# Patient Record
Sex: Male | Born: 1975
Health system: Southern US, Community
[De-identification: ages and names within clinical notes are randomized; demographics above are authoritative.]

## PROBLEM LIST (undated history)

## (undated) DIAGNOSIS — T4145XA Adverse effect of unspecified anesthetic, initial encounter: Secondary | ICD-10-CM

## (undated) DIAGNOSIS — I10 Essential (primary) hypertension: Secondary | ICD-10-CM

## (undated) DIAGNOSIS — G894 Chronic pain syndrome: Secondary | ICD-10-CM

## (undated) DIAGNOSIS — E119 Type 2 diabetes mellitus without complications: Secondary | ICD-10-CM

## (undated) DIAGNOSIS — T8859XA Other complications of anesthesia, initial encounter: Secondary | ICD-10-CM

## (undated) DIAGNOSIS — G709 Myoneural disorder, unspecified: Secondary | ICD-10-CM

## (undated) DIAGNOSIS — Z87442 Personal history of urinary calculi: Secondary | ICD-10-CM

## (undated) DIAGNOSIS — M1990 Unspecified osteoarthritis, unspecified site: Secondary | ICD-10-CM

## (undated) HISTORY — PX: COLONOSCOPY: SHX174

## (undated) HISTORY — PX: KNEE ARTHROSCOPY: SUR90

## (undated) HISTORY — PX: COLON SURGERY: SHX602

## (undated) NOTE — *Deleted (*Deleted)
Neurology Progress Note  Patient ID: Ivan Jones is a 16 y.o. with PMHx of  has a past medical history of Arthritis, Complication of anesthesia, Diabetes mellitus without complication (HCC), History of kidney stones, and Hypertension, who presented on 10/29 to [facility] w/ retrosternal chest pain radiating to the left shoulder, felt to be an anxiety attack, followed by bilateral hand weakness / numbness on 10/30 (sudden onset). Started on pulse dose steroids 10/31, planned to complete 11/04  Initially consulted for: ***   Major interval events:  ***  Subjective: ***  Exam: Vitals:   12/04/19 1934 12/04/19 2100  BP: (!) 141/87 133/83  Pulse: (!) 106 (!) 103  Resp: (!) 24 19  Temp:  98.7 F (37.1 C)  SpO2: 95% 95%   Gen: In bed, comfortable  Resp: non-labored breathing, no grossly audible wheezing Cardiac: Perfusing extremities well  Abd: soft, nt  Neuro: MS: *** CN:*** Motor: *** Sensory:*** DTR:***  For ease of reference, 10/30 exam by Dr. Thomasena Edis: Mental Status: Patient is awake, alert, oriented to person, place, month, year, and situation. Patient is able to give a clear and coherent history. No signs of aphasia or neglect Cranial Nerves: II: Visual Fields are full. Pupils are equal, round, and reactive to light.  III,IV, VI: EOMI without ptosis or diploplia.  V: Facial sensation is symmetric to temperature VII: Facial movement is symmetric.  VIII: hearing is intact to voice X: Uvula elevates symmetrically XI: Shoulder shrug is symmetric. XII: tongue is midline without atrophy or fasciculations.  Motor: Tone is normal. Bulk is normal. 5/5 strength proximally in bilateral upper extremities distal upper extremity strength 2/5 Lower extremities 5/5 strength proximally 4-/5 disatlly.  Sensory: Sensory level/decreased sharp at C3 Deep Tendon Reflexes: 2+ and symmetric in the biceps/ankle and left patella, right knee replacement.  Plantars: Toes are downgoing  bilaterally.  Cerebellar: FNF and HKS are intact bilaterally.   Pertinent Labs: Pending labs:  - Lumbar puncture with cytology including cell count and differential, glucose, protein, culture, oligoclonal bands, AQP4 antibody, enterovirus PCR, cytomegalovirus PCR, Epstein-Barr virus PCR, herpes virus PCR, varicella-zoster virus PCR, and IgG index. - Serum aquaporin-4 antibodies (AQP4), anti-myelin oligodendrocyte glycoprotein antibodies (MOG), arbovirus panel including West Nile virus antibodies. No results found for: HGBA1C   Impression: ***  Recommendations: - Confirm, EMR ***  Brooke Dare MD-PhD [facility] [phone]

---

## 2015-11-29 ENCOUNTER — Ambulatory Visit: Payer: Self-pay | Admitting: Orthopedic Surgery

## 2016-01-11 ENCOUNTER — Encounter (HOSPITAL_COMMUNITY): Admission: RE | Admit: 2016-01-11 | Payer: Self-pay | Source: Ambulatory Visit

## 2016-01-17 ENCOUNTER — Inpatient Hospital Stay: Admit: 2016-01-17 | Payer: Self-pay | Admitting: Specialist

## 2016-01-17 SURGERY — ARTHROPLASTY, KNEE, TOTAL
Anesthesia: Choice | Laterality: Right

## 2016-02-04 HISTORY — PX: COLON SURGERY: SHX602

## 2016-12-19 ENCOUNTER — Other Ambulatory Visit: Payer: Self-pay | Admitting: Orthopedic Surgery

## 2016-12-19 ENCOUNTER — Ambulatory Visit: Payer: Self-pay | Admitting: Orthopedic Surgery

## 2017-01-09 ENCOUNTER — Ambulatory Visit: Payer: Self-pay | Admitting: Orthopedic Surgery

## 2017-01-09 ENCOUNTER — Other Ambulatory Visit (HOSPITAL_COMMUNITY): Payer: Self-pay | Admitting: *Deleted

## 2017-01-09 NOTE — Patient Instructions (Addendum)
Ivan GlossDavid Jones  01/09/2017   Your procedure is scheduled on: 01-15-17  [facility]  Entrance [place]  elevators to 3rd floor to  Short Stay Center at     0800 AM.   Call this number if you have problems the morning of surgery [phone]    Remember: ONLY 1 PERSON MAY GO WITH YOU TO SHORT STAY TO GET  READY MORNING OF YOUR SURGERY.  Do not eat food or drink liquids :After Midnight.   How to Manage Your Diabetes Before and After Surgery  Why is it important to control my blood sugar before and after surgery? . Improving blood sugar levels before and after surgery helps healing and can limit problems. . A way of improving blood sugar control is eating a healthy diet by: o  Eating less sugar and carbohydrates o  Increasing activity/exercise o  Talking with your doctor about reaching your blood sugar goals . High blood sugars (greater than 180 mg/dL) can raise your risk of infections and slow your recovery, so you will need to focus on controlling your diabetes during the weeks before surgery. . Make sure that the doctor who takes care of your diabetes knows about your planned surgery including the date and location.  How do I manage my blood sugar before surgery? . Check your blood sugar at least 4 times a day, starting 2 days before surgery, to make sure that the level is not too high or low. o Check your blood sugar the morning of your surgery when you wake up and every 2 hours until you get to the Short Stay unit. . If your blood sugar is less than 70 mg/dL, you will need to treat for low blood sugar: o Do not take insulin. o Treat a low blood sugar (less than 70 mg/dL) with  cup of clear juice (cranberry or apple), 4 glucose tablets, OR glucose gel. o Recheck blood sugar in 15 minutes after treatment (to make sure it is greater than 70 mg/dL). If your blood sugar is not greater than 70 mg/dL on recheck, call [phone] for further  instructions. . Report your blood sugar to the short stay nurse when you get to Short Stay.  . If you are admitted to the hospital after surgery: o Your blood sugar will be checked by the staff and you will probably be given insulin after surgery (instead of oral diabetes medicines) to make sure you have good blood sugar levels. o The goal for blood sugar control after surgery is 80-180 mg/dL.   WHAT DO I DO ABOUT MY DIABETES MEDICATION?  Marland Kitchen. Do not take oral diabetes medicines (pills) the morning of surgery.  . THE DAy BEFORE SURGERY 01-14-17 TAKE YOUR JANUET AS USUAL     . THE MORNING OF SURGERY DO NOT TAKE YOUR JANUMET   Take these medicines the morning of surgery with A SIP OF WATER: NONE DO NOT TAKE ANY DIABETIC MEDICATIONS DAY OF YOUR SURGERY                               You may not have any metal on your body including hair pins and              piercings  Do not wear jewelry,, lotions, powders or perfumes, deodorant  Men may shave face and neck.   Do not bring valuables to the hospital. [facility] IS NOT             RESPONSIBLE   FOR VALUABLES.  Contacts, dentures or bridgework may not be worn into surgery.  Leave suitcase in the car. After surgery it may be brought to your room.                  Please read over the following fact sheets you were given: ___________________________________________________________________           [facility] - Preparing for Surgery Before surgery, you can play an important role.  Because skin is not sterile, your skin needs to be as free of germs as possible.  You can reduce the number of germs on your skin by washing with CHG (chlorahexidine gluconate) soap before surgery.  CHG is an antiseptic cleaner which kills germs and bonds with the skin to continue killing germs even after washing. Please DO NOT use if you have an allergy to CHG or antibacterial soaps.  If your skin becomes reddened/irritated stop using the CHG  and inform your nurse when you arrive at Short Stay. Do not shave (including legs and underarms) for at least 48 hours prior to the first CHG shower.  You may shave your face/neck. Please follow these instructions carefully:  1.  Shower with CHG Soap the night before surgery and the  morning of Surgery.  2.  If you choose to wash your hair, wash your hair first as usual with your  normal  shampoo.  3.  After you shampoo, rinse your hair and body thoroughly to remove the  shampoo.                           4.  Use CHG as you would any other liquid soap.  You can apply chg directly  to the skin and wash                       Gently with a scrungie or clean washcloth.  5.  Apply the CHG Soap to your body ONLY FROM THE NECK DOWN.   Do not use on face/ open                           Wound or open sores. Avoid contact with eyes, ears mouth and genitals (private parts).                       Wash face,  Genitals (private parts) with your normal soap.             6.  Wash thoroughly, paying special attention to the area where your surgery  will be performed.  7.  Thoroughly rinse your body with warm water from the neck down.  8.  DO NOT shower/wash with your normal soap after using and rinsing off  the CHG Soap.                9.  Pat yourself dry with a clean towel.            10.  Wear clean pajamas.            11.  Place clean sheets on your bed the night of your first shower and do not  sleep with pets. Day  of Surgery : Do not apply any lotions/deodorants the morning of surgery.  Please wear clean clothes to the hospital/surgery center.  FAILURE TO FOLLOW THESE INSTRUCTIONS MAY RESULT IN THE CANCELLATION OF YOUR SURGERY PATIENT SIGNATURE_________________________________  NURSE SIGNATURE__________________________________  ________________________________________________________________________   Ivan MireIncentive Spirometer  An incentive spirometer is a tool that can help keep your lungs clear and  active. This tool measures how well you are filling your lungs with each breath. Taking long deep breaths may help reverse or decrease the chance of developing breathing (pulmonary) problems (especially infection) following:  A long period of time when you are unable to move or be active. BEFORE THE PROCEDURE   If the spirometer includes an indicator to show your best effort, your nurse or respiratory therapist will set it to a desired goal.  If possible, sit up straight or lean slightly forward. Try not to slouch.  Hold the incentive spirometer in an upright position. INSTRUCTIONS FOR USE  1. Sit on the edge of your bed if possible, or sit up as far as you can in bed or on a chair. 2. Hold the incentive spirometer in an upright position. 3. Breathe out normally. 4. Place the mouthpiece in your mouth and seal your lips tightly around it. 5. Breathe in slowly and as deeply as possible, raising the piston or the ball toward the top of the column. 6. Hold your breath for 3-5 seconds or for as long as possible. Allow the piston or ball to fall to the bottom of the column. 7. Remove the mouthpiece from your mouth and breathe out normally. 8. Rest for a few seconds and repeat Steps 1 through 7 at least 10 times every 1-2 hours when you are awake. Take your time and take a few normal breaths between deep breaths. 9. The spirometer may include an indicator to show your best effort. Use the indicator as a goal to work toward during each repetition. 10. After each set of 10 deep breaths, practice coughing to be sure your lungs are clear. If you have an incision (the cut made at the time of surgery), support your incision when coughing by placing a pillow or rolled up towels firmly against it. Once you are able to get out of bed, walk around indoors and cough well. You may stop using the incentive spirometer when instructed by your caregiver.  RISKS AND COMPLICATIONS  Take your time so you do not get  dizzy or light-headed.  If you are in pain, you may need to take or ask for pain medication before doing incentive spirometry. It is harder to take a deep breath if you are having pain. AFTER USE  Rest and breathe slowly and easily.  It can be helpful to keep track of a log of your progress. Your caregiver can provide you with a simple table to help with this. If you are using the spirometer at home, follow these instructions: SEEK MEDICAL CARE IF:   You are having difficultly using the spirometer.  You have trouble using the spirometer as often as instructed.  Your pain medication is not giving enough relief while using the spirometer.  You develop fever of 100.5 F (38.1 C) or higher. SEEK IMMEDIATE MEDICAL CARE IF:   You cough up bloody sputum that had not been present before.  You develop fever of 102 F (38.9 C) or greater.  You develop worsening pain at or near the incision site. MAKE SURE YOU:   Understand these instructions.  Will watch your condition.  Will get help right away if you are not doing well or get worse. Document Released: 06/02/2006 Document Revised: 04/14/2011 Document Reviewed: 08/03/2006 [facility] Patient Information 2014 [place], [place].   ________________________________________________________________________

## 2017-01-09 NOTE — Progress Notes (Signed)
HEMAGLOBIN A1C 12-16-16 [facility] ON CHART MEDICAL CLEARANCE DR Marylynn PearsonBRIAN BURKHART 12-16-16 ON CHART

## 2017-01-09 NOTE — H&P (View-Only) (Signed)
Ivan Jones DOB: 11/06/1975 Married / Language: English / Race: White Male  H&P Date 01/08/17  CC Right knee pain  History of Present Illness  The patient is a 41 year old male who comes in today for a preoperative History and Physical. The patient is scheduled for a right total knee arthroplasty to be performed by Dr. Jeffrey C. Beane, MD at  Hospital on 01/15/17. Ivan Jones reports years of R knee pain following prior injury (ACL tear) which has progressively worsened with time, now causing instability and severe weight-bearing pain. He reports pain refractory to bracing, ice and elevation, quad strengthening, narcotic medications, NSAIDs, steroid injections, viscosupplementation, arthroscopy, relative rest and activity modifications. His symptoms are interfering with ADLs and quality of life and he desires to proceed with surgery.  Dr. Beane and the patient mutually agreed to proceed with a total knee replacement on the right. Risks and benefits of the procedure were discussed including stiffness, suboptimal range of motion, persistent pain, infection requiring removal of prosthesis and reinsertion, need for prophylactic antibiotics in the future, for example, dental procedures, possible need for manipulation, revision in the future and also anesthetic complications including DVT, PE, etc. We discussed the perioperative course, time in the hospital, postoperative recovery and the need for elevation to control swelling. We also discussed the predicted range of motion and the probability that squatting and kneeling would be unobtainable in the future. In addition, postoperative anticoagulation was discussed. We have obtained preoperative medical clearance as necessary. Provided illustrated handout and discussed it in detail. They will enroll in the total joint replacement educational forum at the hospital.  Problem List/Past Medical Hx S/P arthroscopy of knee (Z98.890)  Chronic pain of right  knee (M25.561)  Tear of lateral meniscus of right knee, current, unspecified tear type, subsequent encounter (S83.281D)  Shoulder impingement, right (M75.41)  Primary osteoarthritis of right knee (M17.11)  Diverticulitis Diabetes Mellitus, Type II  Diverticulitis Of Colon  High blood pressure  Kidney Stone   Allergies  No Known Drug Allergies [11/30/2014]:  Family History Cancer  grandmother fathers side and father Diabetes Mellitus  father and sister Hypertension  father First Degree Relatives   Social History Tobacco use  11/30/2014: never smoker Children  1 Current drinker  11/30/2014: Currently drinks beer only occasionally per week Current work status  working full time Exercise  Exercises weekly; does team sport Living situation  live with spouse Marital status  married No history of drug/alcohol rehab  Not under pain contract  Number of flights of stairs before winded  greater than 5 Tobacco / smoke exposure  11/30/2014: no  Medication History  Percocet (5-325MG Tablet, 1 (one) Oral po qhs, Taken starting 12/18/2016) Active. Janumet XR (50-1000MG Tablet ER 24HR, Oral) Active. Losartan Potassium (100MG Tablet, Oral) Active. Ibuprofen (Oral) Specific strength unknown - Active.  Past Surgical History Arthroscopy of Knee  right Colon Removal - Partial   Review of Systems General Not Present- Chills, Fatigue, Fever, Memory Loss, Night Sweats, Weight Gain and Weight Loss. Skin Not Present- Eczema, Hives, Itching, Lesions and Rash. HEENT Not Present- Dentures, Double Vision, Headache, Hearing Loss, Tinnitus and Visual Loss. Respiratory Not Present- Allergies, Chronic Cough, Coughing up blood, Shortness of breath at rest and Shortness of breath with exertion. Cardiovascular Not Present- Chest Pain, Difficulty Breathing Lying Down, Murmur, Palpitations, Racing/skipping heartbeats and Swelling. Gastrointestinal Not Present- Abdominal Pain,  Bloody Stool, Constipation, Diarrhea, Difficulty Swallowing, Heartburn, Jaundice, Loss of appetitie, Nausea and Vomiting. Male Genitourinary Not Present-   Blood in Urine, Discharge, Flank Pain, Incontinence, Painful Urination, Urgency, Urinary frequency, Urinary Retention, Urinating at Night and Weak urinary stream. Musculoskeletal Present- Decreased Range of Motion, Joint Pain, Joint Swelling, Morning Stiffness and Muscle Pain. Not Present- Back Pain, Muscle Weakness and Spasms. Neurological Not Present- Blackout spells, Difficulty with balance, Dizziness, Paralysis, Tremor and Weakness. Psychiatric Not Present- Insomnia.  Physical Exam General Mental Status -Alert, cooperative and good historian. General Appearance-pleasant, Not in acute distress. Orientation-Oriented X3. Build & Nutrition-Well nourished and Well developed.  Head and Neck Head-normocephalic, atraumatic . Neck Global Assessment - supple, no bruit auscultated on the right, no bruit auscultated on the left.  Eye Pupil - Bilateral-PERRLA. Motion - Bilateral-EOMI.  Chest and Lung Exam Auscultation Breath sounds - clear at anterior chest wall and clear at posterior chest wall. Adventitious sounds - No Adventitious sounds.  Cardiovascular Auscultation Rhythm - Regular rate and rhythm. Heart Sounds - S1 WNL and S2 WNL. Murmurs & Other Heart Sounds - Auscultation of the heart reveals - No Murmurs.  Abdomen Palpation/Percussion Tenderness - Abdomen is non-tender to palpation. Rigidity (guarding) - Abdomen is soft. Auscultation Auscultation of the abdomen reveals - Bowel sounds normal.  Male Genitourinary Not done, not pertinent to present illness  Musculoskeletal Note: On examination of the knee, tender to palpation lateral joint line. Nontender on palpation of the medial joint line, patellar tendon, quadriceps tendon, patella, peroneal nerve and popliteal space. No calf pain or sign of DVT. No pain or  laxity with varus or valgus stress. No instability noted. Negative McMurray's. Trace effusion noted. Range of motion today approximately -5-105. Mild patellofemoral crepitus. No patellofemoral pain on compression. Sensation intact distally.  Imaging Xrays with end-stage degenerative changes right knee, full collapse of lateral joint space, bone-on-bone with valgus alignment.  Assessment & Plan  Primary osteoarthritis of right knee (M17.11)  Impression: R knee post-traumatic DJD refractory to conservative tx  Plan: Pt with end-stage R knee DJD, bone-on-bone, refractory to conservative tx, scheduled for R total knee replacement by Dr. Shelle IronBeane 01/15/17. We again discussed the procedure itself as well as risks, complications and alternatives, including but not limited to DVT, PE, infx, bleeding, failure of procedure, need for secondary procedure including manipulation, nerve injury, ongoing pain/symptoms, anesthesia risk, even stroke or death. Also discussed typical post-op protocols, activity restrictions, need for PT, flexion/extension exercises, time out of work. Discussed need for DVT ppx post-op per protocol. Discussed dental ppx and infx prevention. Also discussed limitations post-operatively such as kneeling and squatting. All questions were answered. Patient desires to proceed with surgery as scheduled. Will hold supplements, ASA and NSAIDs accordingly. Discussed holding his diabetic medication and losartan morning of surgery. Will remain NPO after MN night before surgery. [facility]. Anticipate hospital stay to include at least 2 midnights given medical history and to ensure proper pain control. Plan ASA 325mg  BID for DVT ppx post-op. Plan Percocet, Robaxin, Colace, Miralax. Plan home with HHPT post-op with family members at home for assistance. Will follow up 10-14 days post-op for staple removal and xrays. He is having to take 2 Percocet 5mg  at night now to get sleep due  to pain. Will temporarily increase to 7.5mg , can take 2 at a time at night when pain is severe. Hope to be able to go back to 5mg  relatively soon post-operatively.  Plan right total knee replacement  Signed electronically by Dorothy SparkJaclyn M Jiraiya Mcewan, PA-C for Dr. Shelle IronBeane

## 2017-01-09 NOTE — H&P (Signed)
Ivan Jones DOB: November 13, 1975 Married / Language: English / Race: White Male  H&P Date 01/08/17  CC Right knee pain  History of Present Illness  The patient is a 41 year old male who comes in today for a preoperative History and Physical. The patient is scheduled for a right total knee arthroplasty to be performed by Dr. Javier DockerJeffrey C. Beane, MD at [facility] on 01/15/17. Ivan Jones reports years of R knee pain following prior injury (ACL tear) which has progressively worsened with time, now causing instability and severe weight-bearing pain. He reports pain refractory to bracing, ice and elevation, quad strengthening, narcotic medications, NSAIDs, steroid injections, viscosupplementation, arthroscopy, relative rest and activity modifications. His symptoms are interfering with ADLs and quality of life and he desires to proceed with surgery.  Dr. Shelle IronBeane and the patient mutually agreed to proceed with a total knee replacement on the right. Risks and benefits of the procedure were discussed including stiffness, suboptimal range of motion, persistent pain, infection requiring removal of prosthesis and reinsertion, need for prophylactic antibiotics in the future, for example, dental procedures, possible need for manipulation, revision in the future and also anesthetic complications including DVT, PE, etc. We discussed the perioperative course, time in the hospital, postoperative recovery and the need for elevation to control swelling. We also discussed the predicted range of motion and the probability that squatting and kneeling would be unobtainable in the future. In addition, postoperative anticoagulation was discussed. We have obtained preoperative medical clearance as necessary. Provided illustrated handout and discussed it in detail. They will enroll in the total joint replacement educational forum at the hospital.  Problem List/Past Medical Hx S/P arthroscopy of knee (Z98.890)  Chronic pain of right  knee (M25.561)  Tear of lateral meniscus of right knee, current, unspecified tear type, subsequent encounter (W09.811B(S83.281D)  Shoulder impingement, right (M75.41)  Primary osteoarthritis of right knee (M17.11)  Diverticulitis Diabetes Mellitus, Type II  Diverticulitis Of Colon  High blood pressure  Kidney Stone   Allergies  No Known Drug Allergies [11/30/2014]:  Family History Cancer  grandmother fathers side and father Diabetes Mellitus  father and sister Hypertension  father First Degree Relatives   Social History Tobacco use  11/30/2014: never smoker Children  1 Current drinker  11/30/2014: Currently drinks beer only occasionally per week Current work status  working full time Exercise  Exercises weekly; does team sport Living situation  live with spouse Marital status  married No history of drug/alcohol rehab  Not under pain contract  Number of flights of stairs before winded  greater than 5 Tobacco / smoke exposure  11/30/2014: no  Medication History  Percocet (5-325MG  Tablet, 1 (one) Oral po qhs, Taken starting 12/18/2016) Active. Janumet XR (50-1000MG  Tablet ER 24HR, Oral) Active. Losartan Potassium (100MG  Tablet, Oral) Active. Ibuprofen (Oral) Specific strength unknown - Active.  Past Surgical History Arthroscopy of Knee  right Colon Removal - Partial   Review of Systems General Not Present- Chills, Fatigue, Fever, Memory Loss, Night Sweats, Weight Gain and Weight Loss. Skin Not Present- Eczema, Hives, Itching, Lesions and Rash. HEENT Not Present- Dentures, Double Vision, Headache, Hearing Loss, Tinnitus and Visual Loss. Respiratory Not Present- Allergies, Chronic Cough, Coughing up blood, Shortness of breath at rest and Shortness of breath with exertion. Cardiovascular Not Present- Chest Pain, Difficulty Breathing Lying Down, Murmur, Palpitations, Racing/skipping heartbeats and Swelling. Gastrointestinal Not Present- Abdominal Pain,  Bloody Stool, Constipation, Diarrhea, Difficulty Swallowing, Heartburn, Jaundice, Loss of appetitie, Nausea and Vomiting. Male Genitourinary Not Present-  Blood in Urine, Discharge, Flank Pain, Incontinence, Painful Urination, Urgency, Urinary frequency, Urinary Retention, Urinating at Night and Weak urinary stream. Musculoskeletal Present- Decreased Range of Motion, Joint Pain, Joint Swelling, Morning Stiffness and Muscle Pain. Not Present- Back Pain, Muscle Weakness and Spasms. Neurological Not Present- Blackout spells, Difficulty with balance, Dizziness, Paralysis, Tremor and Weakness. Psychiatric Not Present- Insomnia.  Physical Exam General Mental Status -Alert, cooperative and good historian. General Appearance-pleasant, Not in acute distress. Orientation-Oriented X3. Build & Nutrition-Well nourished and Well developed.  Head and Neck Head-normocephalic, atraumatic . Neck Global Assessment - supple, no bruit auscultated on the right, no bruit auscultated on the left.  Eye Pupil - Bilateral-PERRLA. Motion - Bilateral-EOMI.  Chest and Lung Exam Auscultation Breath sounds - clear at anterior chest wall and clear at posterior chest wall. Adventitious sounds - No Adventitious sounds.  Cardiovascular Auscultation Rhythm - Regular rate and rhythm. Heart Sounds - S1 WNL and S2 WNL. Murmurs & Other Heart Sounds - Auscultation of the heart reveals - No Murmurs.  Abdomen Palpation/Percussion Tenderness - Abdomen is non-tender to palpation. Rigidity (guarding) - Abdomen is soft. Auscultation Auscultation of the abdomen reveals - Bowel sounds normal.  Male Genitourinary Not done, not pertinent to present illness  Musculoskeletal Note: On examination of the knee, tender to palpation lateral joint line. Nontender on palpation of the medial joint line, patellar tendon, quadriceps tendon, patella, peroneal nerve and popliteal space. No calf pain or sign of DVT. No pain or  laxity with varus or valgus stress. No instability noted. Negative McMurray's. Trace effusion noted. Range of motion today approximately -5-105. Mild patellofemoral crepitus. No patellofemoral pain on compression. Sensation intact distally.  Imaging Xrays with end-stage degenerative changes right knee, full collapse of lateral joint space, bone-on-bone with valgus alignment.  Assessment & Plan  Primary osteoarthritis of right knee (M17.11)  Impression: R knee post-traumatic DJD refractory to conservative tx  Plan: Pt with end-stage R knee DJD, bone-on-bone, refractory to conservative tx, scheduled for R total knee replacement by Dr. Shelle IronBeane 01/15/17. We again discussed the procedure itself as well as risks, complications and alternatives, including but not limited to DVT, PE, infx, bleeding, failure of procedure, need for secondary procedure including manipulation, nerve injury, ongoing pain/symptoms, anesthesia risk, even stroke or death. Also discussed typical post-op protocols, activity restrictions, need for PT, flexion/extension exercises, time out of work. Discussed need for DVT ppx post-op per protocol. Discussed dental ppx and infx prevention. Also discussed limitations post-operatively such as kneeling and squatting. All questions were answered. Patient desires to proceed with surgery as scheduled. Will hold supplements, ASA and NSAIDs accordingly. Discussed holding his diabetic medication and losartan morning of surgery. Will remain NPO after MN night before surgery. [facility]. Anticipate hospital stay to include at least 2 midnights given medical history and to ensure proper pain control. Plan ASA 325mg  BID for DVT ppx post-op. Plan Percocet, Robaxin, Colace, Miralax. Plan home with HHPT post-op with family members at home for assistance. Will follow up 10-14 days post-op for staple removal and xrays. He is having to take 2 Percocet 5mg  at night now to get sleep due  to pain. Will temporarily increase to 7.5mg , can take 2 at a time at night when pain is severe. Hope to be able to go back to 5mg  relatively soon post-operatively.  Plan right total knee replacement  Signed electronically by Dorothy SparkJaclyn M Katti Pelle, PA-C for Dr. Shelle IronBeane

## 2017-01-12 ENCOUNTER — Encounter (HOSPITAL_COMMUNITY)
Admission: RE | Admit: 2017-01-12 | Discharge: 2017-01-12 | Disposition: A | Discharge status: Home / Self Care | Payer: 59 | Source: Ambulatory Visit | Attending: Specialist | Admitting: Specialist

## 2017-01-12 ENCOUNTER — Other Ambulatory Visit: Payer: Self-pay

## 2017-01-12 ENCOUNTER — Encounter (HOSPITAL_COMMUNITY): Payer: Self-pay

## 2017-01-12 HISTORY — DX: Adverse effect of unspecified anesthetic, initial encounter: T41.45XA

## 2017-01-12 HISTORY — DX: Unspecified osteoarthritis, unspecified site: M19.90

## 2017-01-12 HISTORY — DX: Personal history of urinary calculi: Z87.442

## 2017-01-12 HISTORY — DX: Essential (primary) hypertension: I10

## 2017-01-12 HISTORY — DX: Other complications of anesthesia, initial encounter: T88.59XA

## 2017-01-12 HISTORY — DX: Type 2 diabetes mellitus without complications: E11.9

## 2017-01-14 ENCOUNTER — Ambulatory Visit (HOSPITAL_COMMUNITY)
Admission: RE | Admit: 2017-01-14 | Discharge: 2017-01-14 | Disposition: A | Discharge status: Home / Self Care | Payer: 59 | Source: Ambulatory Visit | Attending: Orthopedic Surgery | Admitting: Orthopedic Surgery

## 2017-01-14 ENCOUNTER — Encounter (HOSPITAL_COMMUNITY)
Admission: RE | Admit: 2017-01-14 | Discharge: 2017-01-14 | Disposition: A | Discharge status: Home / Self Care | Payer: 59 | Source: Ambulatory Visit | Attending: Specialist | Admitting: Specialist

## 2017-01-14 DIAGNOSIS — Z01810 Encounter for preprocedural cardiovascular examination: Secondary | ICD-10-CM

## 2017-01-14 DIAGNOSIS — M1731 Unilateral post-traumatic osteoarthritis, right knee: Secondary | ICD-10-CM | POA: Insufficient documentation

## 2017-01-14 DIAGNOSIS — Z01812 Encounter for preprocedural laboratory examination: Secondary | ICD-10-CM | POA: Insufficient documentation

## 2017-01-14 LAB — URINALYSIS, ROUTINE W REFLEX MICROSCOPIC
Bilirubin Urine: NEGATIVE
GLUCOSE, UA: NEGATIVE mg/dL
Hgb urine dipstick: NEGATIVE
Ketones, ur: NEGATIVE mg/dL
LEUKOCYTES UA: NEGATIVE
Nitrite: NEGATIVE
PROTEIN: NEGATIVE mg/dL
Specific Gravity, Urine: 1.025 (ref 1.005–1.030)
pH: 6 (ref 5.0–8.0)

## 2017-01-14 LAB — CBC
HEMATOCRIT: 41.9 % (ref 39.0–52.0)
HEMOGLOBIN: 14.1 g/dL (ref 13.0–17.0)
MCH: 30.6 pg (ref 26.0–34.0)
MCHC: 33.7 g/dL (ref 30.0–36.0)
MCV: 90.9 fL (ref 78.0–100.0)
Platelets: 293 10*3/uL (ref 150–400)
RBC: 4.61 MIL/uL (ref 4.22–5.81)
RDW: 12.5 % (ref 11.5–15.5)
WBC: 11.1 10*3/uL — AB (ref 4.0–10.5)

## 2017-01-14 LAB — BASIC METABOLIC PANEL
ANION GAP: 9 (ref 5–15)
BUN: 22 mg/dL — AB (ref 6–20)
CO2: 28 mmol/L (ref 22–32)
Calcium: 9.7 mg/dL (ref 8.9–10.3)
Chloride: 101 mmol/L (ref 101–111)
Creatinine, Ser: 1 mg/dL (ref 0.61–1.24)
Glucose, Bld: 141 mg/dL — ABNORMAL HIGH (ref 65–99)
POTASSIUM: 4.2 mmol/L (ref 3.5–5.1)
SODIUM: 138 mmol/L (ref 135–145)

## 2017-01-14 LAB — APTT: aPTT: 31 seconds (ref 24–36)

## 2017-01-14 LAB — SURGICAL PCR SCREEN
MRSA, PCR: NEGATIVE
Staphylococcus aureus: NEGATIVE

## 2017-01-14 LAB — PROTIME-INR
INR: 0.99
Prothrombin Time: 13 seconds (ref 11.4–15.2)

## 2017-01-14 NOTE — Progress Notes (Signed)
01/14/17-ekg-epic

## 2017-01-15 ENCOUNTER — Encounter (HOSPITAL_COMMUNITY): Payer: Self-pay

## 2017-01-15 ENCOUNTER — Encounter (HOSPITAL_COMMUNITY)
Admission: RE | Disposition: A | Discharge status: Home Health | Payer: Self-pay | Source: Ambulatory Visit | Attending: Specialist

## 2017-01-15 ENCOUNTER — Other Ambulatory Visit: Payer: Self-pay

## 2017-01-15 ENCOUNTER — Inpatient Hospital Stay (HOSPITAL_COMMUNITY): Payer: 59 | Admitting: Anesthesiology

## 2017-01-15 ENCOUNTER — Inpatient Hospital Stay (HOSPITAL_COMMUNITY)
Admission: RE | Admit: 2017-01-15 | Discharge: 2017-01-17 | DRG: 470 | Disposition: A | Discharge status: Home Health | Payer: 59 | Source: Ambulatory Visit | Attending: Specialist | Admitting: Specialist

## 2017-01-15 ENCOUNTER — Inpatient Hospital Stay (HOSPITAL_COMMUNITY): Payer: 59

## 2017-01-15 DIAGNOSIS — I10 Essential (primary) hypertension: Secondary | ICD-10-CM | POA: Diagnosis present

## 2017-01-15 DIAGNOSIS — E119 Type 2 diabetes mellitus without complications: Secondary | ICD-10-CM | POA: Diagnosis present

## 2017-01-15 DIAGNOSIS — M21061 Valgus deformity, not elsewhere classified, right knee: Secondary | ICD-10-CM | POA: Diagnosis present

## 2017-01-15 DIAGNOSIS — M1711 Unilateral primary osteoarthritis, right knee: Secondary | ICD-10-CM | POA: Diagnosis present

## 2017-01-15 DIAGNOSIS — G8929 Other chronic pain: Secondary | ICD-10-CM | POA: Diagnosis present

## 2017-01-15 DIAGNOSIS — Z96659 Presence of unspecified artificial knee joint: Secondary | ICD-10-CM

## 2017-01-15 DIAGNOSIS — E669 Obesity, unspecified: Secondary | ICD-10-CM | POA: Diagnosis present

## 2017-01-15 DIAGNOSIS — M1731 Unilateral post-traumatic osteoarthritis, right knee: Principal | ICD-10-CM

## 2017-01-15 DIAGNOSIS — Z6835 Body mass index (BMI) 35.0-35.9, adult: Secondary | ICD-10-CM | POA: Diagnosis not present

## 2017-01-15 HISTORY — PX: TOTAL KNEE ARTHROPLASTY: SHX125

## 2017-01-15 LAB — GLUCOSE, CAPILLARY
GLUCOSE-CAPILLARY: 133 mg/dL — AB (ref 65–99)
Glucose-Capillary: 126 mg/dL — ABNORMAL HIGH (ref 65–99)
Glucose-Capillary: 226 mg/dL — ABNORMAL HIGH (ref 65–99)
Glucose-Capillary: 180 mg/dL — ABNORMAL HIGH (ref 65–99)

## 2017-01-15 LAB — TYPE AND SCREEN
ABO/RH(D): O POS
Antibody Screen: NEGATIVE

## 2017-01-15 LAB — ABO/RH: ABO/RH(D): O POS

## 2017-01-15 SURGERY — ARTHROPLASTY, KNEE, TOTAL
Anesthesia: Regional | Site: Knee | Laterality: Right

## 2017-01-15 MED ORDER — POLYETHYLENE GLYCOL 3350 17 G PO PACK: 17.0000 g | PACK | Freq: Every day | ORAL | 0 refills | Status: DC

## 2017-01-15 MED ORDER — OXYCODONE HCL 5 MG PO TABS: 10.0000 mg | ORAL_TABLET | ORAL | Status: DC | PRN

## 2017-01-15 MED ORDER — MIDAZOLAM HCL 5 MG/5ML IJ SOLN
INTRAMUSCULAR | Status: DC | PRN
  Administered 2017-01-15: 1 mg via INTRAVENOUS
  Administered 2017-01-15: 2 mg via INTRAVENOUS

## 2017-01-15 MED ORDER — FENTANYL CITRATE (PF) 100 MCG/2ML IJ SOLN: 25.0000 ug | INTRAMUSCULAR | Status: DC | PRN

## 2017-01-15 MED ORDER — METOCLOPRAMIDE HCL 5 MG/ML IJ SOLN
5.0000 mg | Freq: Three times a day (TID) | INTRAMUSCULAR | Status: DC | PRN
Start: 2017-01-15 — End: 2017-01-17

## 2017-01-15 MED ORDER — ONDANSETRON HCL 4 MG/2ML IJ SOLN: 4.0000 mg | Freq: Four times a day (QID) | INTRAMUSCULAR | Status: DC | PRN

## 2017-01-15 MED ORDER — SODIUM CHLORIDE 0.9 % IV SOLN
INTRAVENOUS | Status: AC
  Filled 2017-01-15: qty 500000

## 2017-01-15 MED ORDER — PROPOFOL 10 MG/ML IV BOLUS
INTRAVENOUS | Status: AC
Start: 2017-01-15 — End: 2017-01-15
  Filled 2017-01-15: qty 60

## 2017-01-15 MED ORDER — PNEUMOCOCCAL VAC POLYVALENT 25 MCG/0.5ML IJ INJ
0.5000 mL | INJECTION | INTRAMUSCULAR | Status: DC
  Filled 2017-01-15: qty 0.5

## 2017-01-15 MED ORDER — ONDANSETRON HCL 4 MG/2ML IJ SOLN
INTRAMUSCULAR | Status: AC
  Filled 2017-01-15: qty 2

## 2017-01-15 MED ORDER — ROPIVACAINE HCL 7.5 MG/ML IJ SOLN
INTRAMUSCULAR | Status: DC | PRN
  Administered 2017-01-15: 20 mL via PERINEURAL

## 2017-01-15 MED ORDER — ONDANSETRON HCL 4 MG/2ML IJ SOLN
INTRAMUSCULAR | Status: DC | PRN
  Administered 2017-01-15: 4 mg via INTRAVENOUS

## 2017-01-15 MED ORDER — FENTANYL CITRATE (PF) 100 MCG/2ML IJ SOLN
INTRAMUSCULAR | Status: AC
  Filled 2017-01-15: qty 2

## 2017-01-15 MED ORDER — CEFAZOLIN SODIUM-DEXTROSE 2-4 GM/100ML-% IV SOLN
2.0000 g | Freq: Four times a day (QID) | INTRAVENOUS | Status: AC
  Administered 2017-01-15 (×2): 2 g via INTRAVENOUS
  Filled 2017-01-15 (×2): qty 100

## 2017-01-15 MED ORDER — DEXAMETHASONE SODIUM PHOSPHATE 10 MG/ML IJ SOLN
INTRAMUSCULAR | Status: AC
  Filled 2017-01-15: qty 1

## 2017-01-15 MED ORDER — ACETAMINOPHEN 10 MG/ML IV SOLN
1000.0000 mg | INTRAVENOUS | Status: AC
  Administered 2017-01-15: 1000 mg via INTRAVENOUS
  Filled 2017-01-15: qty 100

## 2017-01-15 MED ORDER — FENTANYL CITRATE (PF) 100 MCG/2ML IJ SOLN
50.0000 ug | INTRAMUSCULAR | Status: DC
  Administered 2017-01-15: 100 ug via INTRAVENOUS

## 2017-01-15 MED ORDER — PROPOFOL 10 MG/ML IV BOLUS
INTRAVENOUS | Status: AC
  Filled 2017-01-15: qty 20

## 2017-01-15 MED ORDER — DEXAMETHASONE SODIUM PHOSPHATE 10 MG/ML IJ SOLN
INTRAMUSCULAR | Status: DC | PRN
  Administered 2017-01-15: 10 mg via INTRAVENOUS

## 2017-01-15 MED ORDER — BUPIVACAINE-EPINEPHRINE (PF) 0.25% -1:200000 IJ SOLN
INTRAMUSCULAR | Status: AC
  Filled 2017-01-15: qty 60

## 2017-01-15 MED ORDER — OXYCODONE HCL 5 MG PO TABS
ORAL_TABLET | ORAL | Status: AC
  Administered 2017-01-16: 15 mg via ORAL
  Filled 2017-01-15: qty 3

## 2017-01-15 MED ORDER — LACTATED RINGERS IV SOLN
INTRAVENOUS | Status: DC
  Administered 2017-01-15: 1000 mL via INTRAVENOUS

## 2017-01-15 MED ORDER — OXYCODONE-ACETAMINOPHEN 10-325 MG PO TABS: 1.0000 | ORAL_TABLET | ORAL | 0 refills | Status: DC | PRN

## 2017-01-15 MED ORDER — POLYETHYLENE GLYCOL 3350 17 G PO PACK: 17.0000 g | PACK | Freq: Every day | ORAL | Status: DC | PRN

## 2017-01-15 MED ORDER — PROPOFOL 500 MG/50ML IV EMUL
INTRAVENOUS | Status: DC | PRN
  Administered 2017-01-15: 125 ug/kg/min via INTRAVENOUS

## 2017-01-15 MED ORDER — ONDANSETRON HCL 4 MG PO TABS: 4.0000 mg | ORAL_TABLET | Freq: Four times a day (QID) | ORAL | Status: DC | PRN

## 2017-01-15 MED ORDER — BUPIVACAINE-EPINEPHRINE 0.25% -1:200000 IJ SOLN
INTRAMUSCULAR | Status: DC | PRN
  Administered 2017-01-15: 30 mL

## 2017-01-15 MED ORDER — LOSARTAN POTASSIUM 50 MG PO TABS
100.0000 mg | ORAL_TABLET | Freq: Every day | ORAL | Status: DC
  Administered 2017-01-16 – 2017-01-17 (×2): 100 mg via ORAL
  Filled 2017-01-15 (×2): qty 2

## 2017-01-15 MED ORDER — OXYCODONE HCL 5 MG PO TABS
15.0000 mg | ORAL_TABLET | ORAL | Status: DC | PRN
  Administered 2017-01-15 – 2017-01-16 (×6): 15 mg via ORAL
  Filled 2017-01-15 (×5): qty 3

## 2017-01-15 MED ORDER — RISAQUAD PO CAPS
1.0000 | ORAL_CAPSULE | Freq: Every day | ORAL | Status: DC
  Administered 2017-01-15 – 2017-01-17 (×3): 1 via ORAL
  Filled 2017-01-15 (×3): qty 1

## 2017-01-15 MED ORDER — MIDAZOLAM HCL 2 MG/2ML IJ SOLN
INTRAMUSCULAR | Status: AC
Start: 2017-01-15 — End: 2017-01-15
  Filled 2017-01-15: qty 2

## 2017-01-15 MED ORDER — MIDAZOLAM HCL 2 MG/2ML IJ SOLN
INTRAMUSCULAR | Status: AC
  Filled 2017-01-15: qty 2

## 2017-01-15 MED ORDER — ACETAMINOPHEN 650 MG RE SUPP: 650.0000 mg | RECTAL | Status: DC | PRN

## 2017-01-15 MED ORDER — HYDROMORPHONE HCL 1 MG/ML IJ SOLN
1.0000 mg | INTRAMUSCULAR | Status: DC | PRN
  Administered 2017-01-15 – 2017-01-16 (×2): 1 mg via INTRAVENOUS
  Filled 2017-01-15 (×2): qty 1

## 2017-01-15 MED ORDER — BUPIVACAINE IN DEXTROSE 0.75-8.25 % IT SOLN
INTRATHECAL | Status: DC | PRN
  Administered 2017-01-15: 2 mL via INTRATHECAL

## 2017-01-15 MED ORDER — GLYCOPYRROLATE 0.2 MG/ML IV SOSY
PREFILLED_SYRINGE | INTRAVENOUS | Status: AC
  Filled 2017-01-15: qty 3

## 2017-01-15 MED ORDER — DOCUSATE SODIUM 100 MG PO CAPS
100.0000 mg | ORAL_CAPSULE | Freq: Two times a day (BID) | ORAL | Status: DC
  Administered 2017-01-15 – 2017-01-17 (×4): 100 mg via ORAL
  Filled 2017-01-15 (×4): qty 1

## 2017-01-15 MED ORDER — DIPHENHYDRAMINE HCL 12.5 MG/5ML PO ELIX
12.5000 mg | ORAL_SOLUTION | ORAL | Status: DC | PRN
Start: 2017-01-15 — End: 2017-01-17

## 2017-01-15 MED ORDER — INSULIN ASPART 100 UNIT/ML ~~LOC~~ SOLN
0.0000 [IU] | Freq: Three times a day (TID) | SUBCUTANEOUS | Status: DC
Start: 2017-01-15 — End: 2017-01-17
  Administered 2017-01-15: 5 [IU] via SUBCUTANEOUS
  Administered 2017-01-16 (×3): 3 [IU] via SUBCUTANEOUS
  Administered 2017-01-17: 10:00:00 5 [IU] via SUBCUTANEOUS

## 2017-01-15 MED ORDER — FENTANYL CITRATE (PF) 100 MCG/2ML IJ SOLN
INTRAMUSCULAR | Status: DC | PRN
  Administered 2017-01-15 (×2): 25 ug via INTRAVENOUS
  Administered 2017-01-15: 100 ug via INTRAVENOUS

## 2017-01-15 MED ORDER — PROPOFOL 10 MG/ML IV BOLUS
INTRAVENOUS | Status: DC | PRN
  Administered 2017-01-15: 30 mg via INTRAVENOUS

## 2017-01-15 MED ORDER — ONDANSETRON HCL 4 MG/2ML IJ SOLN: 4.0000 mg | Freq: Once | INTRAMUSCULAR | Status: DC | PRN

## 2017-01-15 MED ORDER — MENTHOL 3 MG MT LOZG: 1.0000 | LOZENGE | OROMUCOSAL | Status: DC | PRN

## 2017-01-15 MED ORDER — MAGNESIUM CITRATE PO SOLN: 1.0000 | Freq: Once | ORAL | Status: DC | PRN

## 2017-01-15 MED ORDER — ASPIRIN EC 325 MG PO TBEC: 325.0000 mg | DELAYED_RELEASE_TABLET | Freq: Two times a day (BID) | ORAL | 1 refills | Status: DC

## 2017-01-15 MED ORDER — MIDAZOLAM HCL 2 MG/2ML IJ SOLN
1.0000 mg | INTRAMUSCULAR | Status: DC
  Administered 2017-01-15: 2 mg via INTRAVENOUS

## 2017-01-15 MED ORDER — BISACODYL 5 MG PO TBEC: 5.0000 mg | DELAYED_RELEASE_TABLET | Freq: Every day | ORAL | Status: DC | PRN

## 2017-01-15 MED ORDER — MIDAZOLAM HCL 2 MG/2ML IJ SOLN
INTRAMUSCULAR | Status: AC
  Administered 2017-01-15: 2 mg via INTRAVENOUS
  Filled 2017-01-15: qty 2

## 2017-01-15 MED ORDER — FENTANYL CITRATE (PF) 100 MCG/2ML IJ SOLN
INTRAMUSCULAR | Status: AC
  Filled 2017-01-15: qty 4

## 2017-01-15 MED ORDER — ASPIRIN EC 325 MG PO TBEC
325.0000 mg | DELAYED_RELEASE_TABLET | Freq: Two times a day (BID) | ORAL | Status: DC
  Administered 2017-01-16 – 2017-01-17 (×3): 325 mg via ORAL
  Filled 2017-01-15 (×3): qty 1

## 2017-01-15 MED ORDER — ACETAMINOPHEN 325 MG PO TABS: 650.0000 mg | ORAL_TABLET | ORAL | Status: DC | PRN

## 2017-01-15 MED ORDER — METHOCARBAMOL 1000 MG/10ML IJ SOLN
500.0000 mg | Freq: Four times a day (QID) | INTRAVENOUS | Status: DC | PRN
  Administered 2017-01-15: 500 mg via INTRAVENOUS
  Filled 2017-01-15: qty 550

## 2017-01-15 MED ORDER — FENTANYL CITRATE (PF) 100 MCG/2ML IJ SOLN
INTRAMUSCULAR | Status: AC
  Administered 2017-01-15: 100 ug via INTRAVENOUS
  Filled 2017-01-15: qty 2

## 2017-01-15 MED ORDER — CEFAZOLIN SODIUM-DEXTROSE 2-4 GM/100ML-% IV SOLN
INTRAVENOUS | Status: AC
  Filled 2017-01-15: qty 100

## 2017-01-15 MED ORDER — POLYMYXIN B SULFATE 500000 UNITS IJ SOLR
INTRAMUSCULAR | Status: DC | PRN
  Administered 2017-01-15: 500 mL

## 2017-01-15 MED ORDER — ALUM & MAG HYDROXIDE-SIMETH 200-200-20 MG/5ML PO SUSP: 30.0000 mL | ORAL | Status: DC | PRN

## 2017-01-15 MED ORDER — CEFAZOLIN SODIUM-DEXTROSE 2-4 GM/100ML-% IV SOLN
2.0000 g | INTRAVENOUS | Status: AC
  Administered 2017-01-15: 2 g via INTRAVENOUS

## 2017-01-15 MED ORDER — TRANEXAMIC ACID 1000 MG/10ML IV SOLN
1000.0000 mg | INTRAVENOUS | Status: AC
  Administered 2017-01-15: 1000 mg via INTRAVENOUS
  Filled 2017-01-15: qty 1100

## 2017-01-15 MED ORDER — DOCUSATE SODIUM 100 MG PO CAPS: 100.0000 mg | ORAL_CAPSULE | Freq: Two times a day (BID) | ORAL | 1 refills | Status: DC | PRN

## 2017-01-15 MED ORDER — SODIUM CHLORIDE 0.9 % IR SOLN
Status: DC | PRN
  Administered 2017-01-15 (×2): 1000 mL

## 2017-01-15 MED ORDER — METHOCARBAMOL 500 MG PO TABS
500.0000 mg | ORAL_TABLET | Freq: Four times a day (QID) | ORAL | Status: DC | PRN
  Administered 2017-01-15 – 2017-01-17 (×6): 500 mg via ORAL
  Filled 2017-01-15 (×6): qty 1

## 2017-01-15 MED ORDER — GLYCOPYRROLATE 0.2 MG/ML IJ SOLN
INTRAMUSCULAR | Status: DC | PRN
  Administered 2017-01-15: 0.1 mg via INTRAVENOUS

## 2017-01-15 MED ORDER — METOCLOPRAMIDE HCL 5 MG PO TABS
5.0000 mg | ORAL_TABLET | Freq: Three times a day (TID) | ORAL | Status: DC | PRN
Start: 2017-01-15 — End: 2017-01-17

## 2017-01-15 MED ORDER — PHENOL 1.4 % MT LIQD: 1.0000 | OROMUCOSAL | Status: DC | PRN

## 2017-01-15 SURGICAL SUPPLY — 60 items
BAG ZIPLOCK 12X15 (MISCELLANEOUS) IMPLANT
BANDAGE ACE 4X5 VEL STRL LF (GAUZE/BANDAGES/DRESSINGS) ×3 IMPLANT
BANDAGE ACE 6X5 VEL STRL LF (GAUZE/BANDAGES/DRESSINGS) ×3 IMPLANT
BLADE SAG 18X100X1.27 (BLADE) ×3 IMPLANT
BLADE SAW SGTL 11.0X1.19X90.0M (BLADE) ×3 IMPLANT
BLADE SAW SGTL 13.0X1.19X90.0M (BLADE) ×3 IMPLANT
CAPT KNEE TOTAL 3 ATTUNE ×3 IMPLANT
CEMENT HV SMART SET (Cement) ×6 IMPLANT
CLOSURE WOUND 1/2 X4 (GAUZE/BANDAGES/DRESSINGS)
CLOTH 2% CHLOROHEXIDINE 3PK (PERSONAL CARE ITEMS) ×3 IMPLANT
COVER SURGICAL LIGHT HANDLE (MISCELLANEOUS) ×3 IMPLANT
CUFF TOURN SGL QUICK 34 (TOURNIQUET CUFF) ×2
CUFF TRNQT CYL 34X4X40X1 (TOURNIQUET CUFF) ×1 IMPLANT
DECANTER SPIKE VIAL GLASS SM (MISCELLANEOUS) ×3 IMPLANT
DRAPE INCISE IOBAN 66X45 STRL (DRAPES) IMPLANT
DRAPE ORTHO SPLIT 77X108 STRL (DRAPES) ×4
DRAPE SHEET LG 3/4 BI-LAMINATE (DRAPES) ×3 IMPLANT
DRAPE SURG ORHT 6 SPLT 77X108 (DRAPES) ×2 IMPLANT
DRAPE U-SHAPE 47X51 STRL (DRAPES) ×3 IMPLANT
DRSG AQUACEL AG ADV 3.5X10 (GAUZE/BANDAGES/DRESSINGS) IMPLANT
DRSG TEGADERM 4X4.75 (GAUZE/BANDAGES/DRESSINGS) IMPLANT
DURAPREP 26ML APPLICATOR (WOUND CARE) ×3 IMPLANT
ELECT REM PT RETURN 15FT ADLT (MISCELLANEOUS) ×3 IMPLANT
EVACUATOR 1/8 PVC DRAIN (DRAIN) IMPLANT
GAUZE SPONGE 2X2 8PLY STRL LF (GAUZE/BANDAGES/DRESSINGS) IMPLANT
GLOVE BIOGEL PI IND STRL 7.0 (GLOVE) ×8 IMPLANT
GLOVE BIOGEL PI IND STRL 8 (GLOVE) ×1 IMPLANT
GLOVE BIOGEL PI INDICATOR 7.0 (GLOVE) ×16
GLOVE BIOGEL PI INDICATOR 8 (GLOVE) ×2
GLOVE SURG SS PI 7.0 STRL IVOR (GLOVE) ×3 IMPLANT
GLOVE SURG SS PI 7.5 STRL IVOR (GLOVE) ×3 IMPLANT
GLOVE SURG SS PI 8.0 STRL IVOR (GLOVE) ×6 IMPLANT
GOWN STRL REUS W/TWL XL LVL3 (GOWN DISPOSABLE) ×15 IMPLANT
HANDPIECE INTERPULSE COAX TIP (DISPOSABLE) ×2
HEMOSTAT SPONGE AVITENE ULTRA (HEMOSTASIS) IMPLANT
IMMOBILIZER KNEE 20 (SOFTGOODS) ×3
IMMOBILIZER KNEE 20 THIGH 36 (SOFTGOODS) ×1 IMPLANT
MANIFOLD NEPTUNE II (INSTRUMENTS) ×3 IMPLANT
NS IRRIG 1000ML POUR BTL (IV SOLUTION) IMPLANT
PACK TOTAL KNEE CUSTOM (KITS) ×3 IMPLANT
POSITIONER SURGICAL ARM (MISCELLANEOUS) ×3 IMPLANT
SET HNDPC FAN SPRY TIP SCT (DISPOSABLE) ×1 IMPLANT
SPONGE GAUZE 2X2 STER 10/PKG (GAUZE/BANDAGES/DRESSINGS)
SPONGE SURGIFOAM ABS GEL 100 (HEMOSTASIS) IMPLANT
STAPLER VISISTAT (STAPLE) ×3 IMPLANT
STRIP CLOSURE SKIN 1/2X4 (GAUZE/BANDAGES/DRESSINGS) IMPLANT
SUT BONE WAX W31G (SUTURE) IMPLANT
SUT MNCRL AB 4-0 PS2 18 (SUTURE) IMPLANT
SUT STRATAFIX 0 PDS 27 VIOLET (SUTURE) ×3
SUT VIC AB 1 CT1 27 (SUTURE) ×4
SUT VIC AB 1 CT1 27XBRD ANTBC (SUTURE) ×2 IMPLANT
SUT VIC AB 2-0 CT1 27 (SUTURE) ×6
SUT VIC AB 2-0 CT1 TAPERPNT 27 (SUTURE) ×3 IMPLANT
SUTURE STRATFX 0 PDS 27 VIOLET (SUTURE) ×1 IMPLANT
SYR 50ML LL SCALE MARK (SYRINGE) IMPLANT
TOWER CARTRIDGE SMART MIX (DISPOSABLE) ×3 IMPLANT
TRAY FOLEY W/METER SILVER 16FR (SET/KITS/TRAYS/PACK) ×3 IMPLANT
WATER STERILE IRR 1000ML POUR (IV SOLUTION) ×3 IMPLANT
WRAP KNEE MAXI GEL POST OP (GAUZE/BANDAGES/DRESSINGS) ×3 IMPLANT
YANKAUER SUCT BULB TIP 10FT TU (MISCELLANEOUS) ×3 IMPLANT

## 2017-01-15 NOTE — Transfer of Care (Signed)
Immediate Anesthesia Transfer of Care Note  Patient: Scharlene GlossDavid Hubner  Procedure(s) Performed: RIGHT TOTAL KNEE ARTHROPLASTY (Right Knee)  Patient Location: PACU  Anesthesia Type:Spinal  Level of Consciousness: awake, alert , oriented and patient cooperative  Airway & Oxygen Therapy: Patient Spontanous Breathing and Patient connected to face mask oxygen  Post-op Assessment: Report given to RN and Post -op Vital signs reviewed and stable  Post vital signs: stable  Last Vitals:  Vitals:   01/15/17 0955 01/15/17 1010  BP: (!) 147/105 (!) 140/108  Pulse: 90 87  Resp: (!) 21 19  Temp:    SpO2: 100% 99%    Last Pain:  Vitals:   01/15/17 0857  TempSrc:   PainSc: 2       Patients Stated Pain Goal: 4 (01/15/17 0857)  Complications: No apparent anesthesia complications

## 2017-01-15 NOTE — Interval H&P Note (Signed)
History and Physical Interval Note:  01/15/2017 9:48 AM  Ivan Jones  has presented today for surgery, with the diagnosis of Right knee post traumatice degenerative joint disease  The various methods of treatment have been discussed with the patient and family. After consideration of risks, benefits and other options for treatment, the patient has consented to  Procedure(s) with comments: RIGHT TOTAL KNEE ARTHROPLASTY (Right) - 2 hrs as a surgical intervention .  The patient's history has been reviewed, patient examined, no change in status, stable for surgery.  I have reviewed the patient's chart and labs.  Questions were answered to the patient's satisfaction.     Jalyn Rosero C   

## 2017-01-15 NOTE — Anesthesia Procedure Notes (Signed)
Anesthesia Regional Block: Adductor canal block   Pre-Anesthetic Checklist: ,, timeout performed, Correct Patient, Correct Site, Correct Laterality, Correct Procedure,, site marked, risks and benefits discussed, Surgical consent,  Pre-op evaluation,  At surgeon's request and post-op pain management  Laterality: Right  Prep: chloraprep       Needles:  Injection technique: Single-shot  Needle Type: Echogenic Stimulator Needle     Needle Length: 10cm  Needle Gauge: 21     Additional Needles:   Procedures:,,,, ultrasound used (permanent image in chart),,,,  Narrative:  Start time: 01/15/2017 9:35 AM End time: 01/15/2017 9:45 AM Injection made incrementally with aspirations every 5 mL.  Performed by: Personally  Anesthesiologist: Leonides GrillsEllender, Ormond Lazo P, MD  Additional Notes: Functioning IV was confirmed and monitors were applied.  A 100mm 21ga Pajunk echogenic stimulator needle was used. Sterile prep, hand hygiene and sterile gloves were used.  Negative aspiration and negative test dose prior to incremental administration of local anesthetic. The patient tolerated the procedure well.

## 2017-01-15 NOTE — Anesthesia Procedure Notes (Signed)
Procedure Name: MAC Date/Time: 01/15/2017 10:22 AM Performed by: Lissa Morales, CRNA Pre-anesthesia Checklist: Patient identified, Emergency Drugs available, Suction available, Patient being monitored and Timeout performed Oxygen Delivery Method: Simple face mask Placement Confirmation: CO2 detector Dental Injury: Teeth and Oropharynx as per pre-operative assessment

## 2017-01-15 NOTE — Progress Notes (Signed)
AssistedDr. Ellender with right, ultrasound guided, adductor canal block. Side rails up, monitors on throughout procedure. See vital signs in flow sheet. Tolerated Procedure well.  

## 2017-01-15 NOTE — Discharge Instructions (Signed)

## 2017-01-15 NOTE — Anesthesia Procedure Notes (Addendum)
Spinal  Patient location during procedure: OR End time: 01/15/2017 10:33 AM Staffing Resident/CRNA: Lissa Morales, CRNA Performed: resident/CRNA  Preanesthetic Checklist Completed: patient identified, site marked, surgical consent, pre-op evaluation, timeout performed, IV checked, risks and benefits discussed and monitors and equipment checked Spinal Block Patient position: sitting Prep: DuraPrep Patient monitoring: heart rate, continuous pulse ox and blood pressure Approach: left paramedian Location: L3-4 Injection technique: single-shot Needle Needle type: Pencan  Needle gauge: 24 G Needle length: 9 cm Additional Notes Expiration date of kit checked and confirmed. Patient tolerated procedure well, without complications.

## 2017-01-15 NOTE — Brief Op Note (Signed)
01/15/2017  12:32 PM  PATIENT:  Ivan Jones  41 y.o. male  PRE-OPERATIVE DIAGNOSIS:  Right knee post traumatice degenerative joint disease  POST-OPERATIVE DIAGNOSIS:  Right knee post traumatice degenerative joint disease  PROCEDURE:  Procedure(s) with comments: RIGHT TOTAL KNEE ARTHROPLASTY (Right) - 2 hrs  SURGEON:  Surgeon(s) and Role:    Jene Every* Daliyah Sramek, MD - Primary  PHYSICIAN ASSISTANT:   ASSISTANTS: Bissell   ANESTHESIA:   general  EBL:  100 mL   BLOOD ADMINISTERED:none  DRAINS: none   LOCAL MEDICATIONS USED:  MARCAINE     SPECIMEN:  No Specimen  DISPOSITION OF SPECIMEN:  N/A  COUNTS:  YES  TOURNIQUET:   Total Tourniquet Time Documented: Thigh (Right) - 77 minutes Total: Thigh (Right) - 77 minutes   DICTATION: .Other Dictation: Dictation Number [id number]  PLAN OF CARE: Admit to inpatient   PATIENT DISPOSITION:  PACU - hemodynamically stable.   Delay start of Pharmacological VTE agent (>24hrs) due to surgical blood loss or risk of bleeding: no

## 2017-01-15 NOTE — Interval H&P Note (Signed)
History and Physical Interval Note:  01/15/2017 9:48 AM  Ivan Jones  has presented today for surgery, with the diagnosis of Right knee post traumatice degenerative joint disease  The various methods of treatment have been discussed with the patient and family. After consideration of risks, benefits and other options for treatment, the patient has consented to  Procedure(s) with comments: RIGHT TOTAL KNEE ARTHROPLASTY (Right) - 2 hrs as a surgical intervention .  The patient's history has been reviewed, patient examined, no change in status, stable for surgery.  I have reviewed the patient's chart and labs.  Questions were answered to the patient's satisfaction.     Karishma Unrein C

## 2017-01-15 NOTE — Anesthesia Postprocedure Evaluation (Signed)
Anesthesia Post Note  Patient: Ivan Jones  Procedure(s) Performed: RIGHT TOTAL KNEE ARTHROPLASTY (Right Knee)     Patient location during evaluation: PACU Anesthesia Type: Regional and Spinal Level of consciousness: oriented and awake and alert Pain management: pain level controlled Vital Signs Assessment: post-procedure vital signs reviewed and stable Respiratory status: spontaneous breathing, respiratory function stable and patient connected to nasal cannula oxygen Cardiovascular status: blood pressure returned to baseline and stable Postop Assessment: no headache, no backache, no apparent nausea or vomiting and spinal receding Anesthetic complications: no    Last Vitals:  Vitals:   01/15/17 1500 01/15/17 1600  BP: 128/88 130/85  Pulse: 71 77  Resp: 13 10  Temp: 36.9 C   SpO2: 96% 100%    Last Pain:  Vitals:   01/15/17 1538  TempSrc:   PainSc: 7                  Ivan Jones

## 2017-01-15 NOTE — Anesthesia Preprocedure Evaluation (Addendum)
Anesthesia Evaluation  Patient identified by MRN, date of birth, ID band Patient awake    Reviewed: Allergy & Precautions, NPO status , Patient's Chart, lab work & pertinent test results  Airway Mallampati: III  TM Distance: >3 FB Neck ROM: Full    Dental no notable dental hx.    Pulmonary neg pulmonary ROS,    Pulmonary exam normal breath sounds clear to auscultation       Cardiovascular hypertension, Pt. on medications Normal cardiovascular exam Rhythm:Regular Rate:Normal  ECG: NSR, rate 90   Neuro/Psych negative neurological ROS  negative psych ROS   GI/Hepatic negative GI ROS, Neg liver ROS,   Endo/Other  diabetes, Oral Hypoglycemic Agents  Renal/GU negative Renal ROS     Musculoskeletal  (+) Arthritis , Right knee post traumatice degenerative joint disease   Abdominal (+) + obese,   Peds  Hematology negative hematology ROS (+)   Anesthesia Other Findings   Reproductive/Obstetrics                            Anesthesia Physical Anesthesia Plan  ASA: III  Anesthesia Plan: Spinal and Regional   Post-op Pain Management:  Regional for Post-op pain   Induction:   PONV Risk Score and Plan: 1 and Propofol infusion and Treatment may vary due to age or medical condition  Airway Management Planned: Natural Airway  Additional Equipment:   Intra-op Plan:   Post-operative Plan:   Informed Consent: I have reviewed the patients History and Physical, chart, labs and discussed the procedure including the risks, benefits and alternatives for the proposed anesthesia with the patient or authorized representative who has indicated his/her understanding and acceptance.   Dental advisory given  Plan Discussed with: CRNA  Anesthesia Plan Comments:         Anesthesia Quick Evaluation

## 2017-01-16 LAB — BASIC METABOLIC PANEL
ANION GAP: 9 (ref 5–15)
BUN: 17 mg/dL (ref 6–20)
CHLORIDE: 100 mmol/L — AB (ref 101–111)
CO2: 27 mmol/L (ref 22–32)
Calcium: 9.1 mg/dL (ref 8.9–10.3)
Creatinine, Ser: 0.8 mg/dL (ref 0.61–1.24)
GFR calc non Af Amer: >60 mL/min (ref >60)
Glucose, Bld: 173 mg/dL — ABNORMAL HIGH (ref 65–99)
Potassium: 3.9 mmol/L (ref 3.5–5.1)
SODIUM: 136 mmol/L (ref 135–145)

## 2017-01-16 LAB — GLUCOSE, CAPILLARY
GLUCOSE-CAPILLARY: 187 mg/dL — AB (ref 65–99)
GLUCOSE-CAPILLARY: 191 mg/dL — AB (ref 65–99)
GLUCOSE-CAPILLARY: 190 mg/dL — AB (ref 65–99)
Glucose-Capillary: 285 mg/dL — ABNORMAL HIGH (ref 65–99)

## 2017-01-16 LAB — CBC
HEMATOCRIT: 36.7 % — AB (ref 39.0–52.0)
HEMOGLOBIN: 12.2 g/dL — AB (ref 13.0–17.0)
MCH: 30.1 pg (ref 26.0–34.0)
MCHC: 33.2 g/dL (ref 30.0–36.0)
MCV: 90.6 fL (ref 78.0–100.0)
Platelets: 283 10*3/uL (ref 150–400)
RBC: 4.05 MIL/uL — AB (ref 4.22–5.81)
RDW: 12.2 % (ref 11.5–15.5)
WBC: 21.4 10*3/uL — AB (ref 4.0–10.5)

## 2017-01-16 MED ORDER — METHOCARBAMOL 500 MG PO TABS: 500.0000 mg | ORAL_TABLET | Freq: Four times a day (QID) | ORAL | 1 refills | Status: DC | PRN

## 2017-01-16 MED ORDER — HYDROMORPHONE HCL 2 MG PO TABS
2.0000 mg | ORAL_TABLET | ORAL | Status: DC | PRN
  Administered 2017-01-16: 4 mg via ORAL
  Administered 2017-01-16: 2 mg via ORAL
  Administered 2017-01-16 (×2): 4 mg via ORAL
  Administered 2017-01-16: 2 mg via ORAL
  Administered 2017-01-17 (×4): 4 mg via ORAL
  Filled 2017-01-16 (×3): qty 2
  Filled 2017-01-16: qty 1
  Filled 2017-01-16: qty 2
  Filled 2017-01-16: qty 1
  Filled 2017-01-16 (×3): qty 2

## 2017-01-16 MED ORDER — HYDROMORPHONE HCL 2 MG PO TABS: 2.0000 mg | ORAL_TABLET | ORAL | 0 refills | Status: DC | PRN

## 2017-01-16 MED ORDER — KETOROLAC TROMETHAMINE 15 MG/ML IJ SOLN
15.0000 mg | Freq: Four times a day (QID) | INTRAMUSCULAR | Status: DC | PRN
  Administered 2017-01-16 – 2017-01-17 (×2): 15 mg via INTRAVENOUS
  Filled 2017-01-16 (×2): qty 1

## 2017-01-16 NOTE — Op Note (Signed)
NAMRenard Matter:  Starace, Mathayus                 ACCOUNT NO.:  [id number]  MEDICAL RECORD NO.:  [id number]  LOCATION:                                 FACILITY:  PHYSICIAN:  Jene EveryJeffrey Inice Sanluis, M.D.    DATE OF BIRTH:  01/15/2017  DATE OF PROCEDURE: DATE OF DISCHARGE:                              OPERATIVE REPORT   PREOPERATIVE DIAGNOSIS:  End-stage osteoarthritis with valgus deformity of the right knee.  POSTOPERATIVE DIAGNOSIS:  End-stage osteoarthritis with valgus deformity of the right knee.  PROCEDURE PERFORMED:  Right total knee arthroplasty utilizing DePuy Attune rotating platform, 5 femur, 5 tibia, 35 patella, 5 mm insert.  ANESTHESIA:  Spinal.  ASSISTANT:  Andrez GrimeJaclyn Bissell, P.A.  HISTORY:  41 years old, end-stage osteoarthrosis, posttraumatic valgus deformity, bone-on-bone.  Refractory to rest, activity modification, bracing, injections, arthroscopy.  Indicated for replacement of the degenerated joint despite being 41 years of age.  Discussed risks and benefits including bleeding, infection, damage to neurovascular structures, no change in symptoms, worsening symptoms, DVT, PE, anesthetic complications, etc.  TECHNIQUE:  With the patient in supine position after induction of adequate spinal anesthesia, right lower extremity was prepped and draped, and exsanguinated in usual sterile fashion, thigh tourniquet inflated to 250 mmHg.  Midline incision was made.  Full-thickness flaps developed.  Median parapatellar arthrotomy performed.  Patella everted, knee flexed.  Tricompartmental osteoarthrosis was noted in the lateral compartment as well as posteromedially in the patellofemoral joint. Significant scar tissue was noted from previous surgeries.  We excised scar tissue and mobilized the patella ligament.  Medial and lateral menisci remnants were removed, cauterized the geniculates.  ACL removed. Osteophytes removed with a rongeur.  Step drill was utilized to enter the femoral canal, was  irrigated, 5-degree right was selected with 9 off the distal femur, pinned.  We performed a distal femoral cut.  We sized off the anterior cortex of the femur only to a 5.  With 3 degrees of external rotation and our cutting block, it was found to be satisfactory at least anterior-posterior.  This was then pinned.  We performed anterior, posterior, and chamfer cuts.  A very minimal amount was taken from the lateral femoral condyle.  Next, we subluxed the tibia, this was fairly tight, released the PCL.  We used an external alignment guide and measured the defects which were bilaterally.  Parallel to the shaft, bisecting tibiotalar joint, 3 degree slope.  We took 2 off the defect, which was actually posteromedially.  We pinned it and then performed our proximal tibia cut, protecting the soft tissues at all time.  We checked our extension gap and we actually had full extension with a 5 insert. Next, we then flexed up the knee again, subluxed the tibia, sized it to a 5 just the medial aspect of tibial tubercle.  It was then pinned.  We drilled centrally, used our punch guide.  We turned attention back to the femur.  We used our box cut guide and bisecting the canal was in the center of both of the condyles.  This was then pinned.  We performed our box cut.  We used our trial femur, drilled the lug holes, inserted  a 5 insert and reduced it.  We had full extension, full flexion, good stability, varus and valgus stressing at 0 to 30 degrees.  Attention turned towards the patella, measured to a 25, planed to a 15.  We first tried the 4738, but found that 35 was better in terms of fit.  We used our template parallel to the joint, medializing our peg holes.  I then reduced it and had excellent patellofemoral tracking.  All trials were removed.  We checked posteriorly, had some osteophytes in the posterior femoral condyle, were removed with a rongeur and osteotome, protecting the soft tissues  posteriorly.  Small loose bodies were removed as well. The popliteus was intact as was the capsule.  Copiously irrigated with pulsatile lavage.  Then flexed the knee, subluxed the tibia.  Thoroughly dried the surface.  Mixed cement at the back table, injected into the tibial canal, digitally pressurizing it, impacted, and removed redundant cement.  Cemented and impacted the femur.  Placed a 5 insert, reduced it, held an axial load throughout the curing of the cement with the redundant cement removed.  We cemented and clamped the patella.  After curing of the cement and 0.25% Marcaine with epinephrine in the periosteal tissues and covered the wound and antibiotic irrigation, we flexed the knee.  Flexion and extension revealed good patellofemoral tracking, full extension, full flexion, negative anterior drawer.  We selected a 5, removed 5, and meticulously removed all redundant cement, irrigated, re-cauterized our geniculates.  Subluxed the tibia. Antibiotic irrigation.  Placed a 5 insert, reduced it with towel clips, had full extension, full flexion, good stability with varus valgus stressing at 0 to 30 degrees, negative anterior drawer.  I then reapproximated the patellar arthrotomy with #1 Vicryl interrupted figure- of-eight sutures and then oversewed it with a running Stratafix.  He had flexion to gravity at 90 degrees and excellent patellofemoral tracking after the arthrotomy was closed.  Next, subcu with 2-0 and skin with staples.  Wound was dressed sterilely.  He was then placed in immobilizer and returned to the recovery room in satisfactory condition.  The patient tolerated the procedure well.  No complications.  Assistant, Andrez GrimeJaclyn Bissell.  Minimal blood loss.  Tourniquet time 77 minutes.     Jene EveryJeffrey Nikolus Marczak, M.D.   ______________________________ Jene EveryJeffrey Glendora Clouatre, M.D.    Cordelia PenJB/MEDQ  D:  01/15/2017  T:  01/16/2017  Job:  [id number]

## 2017-01-16 NOTE — Progress Notes (Signed)
CSW consult-SNF Per discharge follow up plan: the patient is to go home with HHPT post-op with family members at home for assistance. Case Manager following for d/c needs.   Vivi BarrackNicole Keoshia Steinmetz, Theresia MajorsLCSWA, MSW Clinical Social Worker  [phone] 01/16/2017  8:42 AM

## 2017-01-16 NOTE — Progress Notes (Signed)
Physical Therapy Treatment Patient Details Name: Scharlene GlossDavid Redd MRN: [id number] DOB: February 27, 1975 Today's Date: 01/16/2017    History of Present Illness Pt s/p R TKR    PT Comments    Pt with improved pain control and ambulated increased distance in hall.  Therex deferred 2* arrival of lunch tray.   Follow Up Recommendations  Home health PT     Equipment Recommendations  Rolling walker with 5" wheels    Recommendations for Other Services OT consult     Precautions / Restrictions Precautions Precautions: Knee;Fall Required Braces or Orthoses: Knee Immobilizer - Right Knee Immobilizer - Right: Discontinue once straight leg raise with < 10 degree lag Restrictions Weight Bearing Restrictions: No Other Position/Activity Restrictions: WBAT    Mobility  Bed Mobility Overal bed mobility: Needs Assistance Bed Mobility: Supine to Sit;Sit to Supine     Supine to sit: Min assist Sit to supine: Min assist   General bed mobility comments: cues for sequence and use of L LE to self assist  Transfers Overall transfer level: Needs assistance Equipment used: Rolling walker (2 wheeled) Transfers: Sit to/from Stand Sit to Stand: Min assist         General transfer comment: cues for LE management and use of UEs to self assist  Ambulation/Gait Ambulation/Gait assistance: Min assist Ambulation Distance (Feet): 64 Feet Assistive device: Rolling walker (2 wheeled) Gait Pattern/deviations: Step-to pattern;Decreased step length - right;Decreased step length - left;Shuffle;Trunk flexed Gait velocity: decr Gait velocity interpretation: Below normal speed for age/gender General Gait Details: cues for posture, sequence and position from [employer]   Stairs            Wheelchair Mobility    Modified Rankin (Stroke Patients Only)       Balance Overall balance assessment: No apparent balance deficits (not formally assessed)                                           Cognition Arousal/Alertness: Awake/alert Behavior During Therapy: WFL for tasks assessed/performed Overall Cognitive Status: Within Functional Limits for tasks assessed                                        Exercises      General Comments        Pertinent Vitals/Pain Pain Assessment: 0-10 Pain Score: 5  Pain Location: R knee Pain Descriptors / Indicators: Aching;Sore Pain Intervention(s): Limited activity within patient's tolerance;Monitored during session;Premedicated before session;Ice applied    Home Living                      Prior Function            PT Goals (current goals can now be found in the care plan section) Acute Rehab PT Goals Patient Stated Goal: Regain IND and get back to work PT Goal Formulation: With patient Time For Goal Achievement: 01/23/17 Potential to Achieve Goals: Good Progress towards PT goals: Progressing toward goals    Frequency    7X/week      PT Plan Current plan remains appropriate    Co-evaluation              AM-PAC PT "6 Clicks" Daily Activity  Outcome Measure  Difficulty turning over in bed (including adjusting bedclothes, sheets and  blankets)?: Unable Difficulty moving from lying on back to sitting on the side of the bed? : Unable Difficulty sitting down on and standing up from a chair with arms (e.g., wheelchair, bedside commode, etc,.)?: A Lot Help needed moving to and from a bed to chair (including a wheelchair)?: A Little Help needed walking in hospital room?: A Little Help needed climbing 3-5 steps with a railing? : A Little 6 Click Score: 13    End of Session Equipment Utilized During Treatment: Gait belt;Right knee immobilizer Activity Tolerance: Patient tolerated treatment well Patient left: in bed;with call bell/phone within reach;with family/visitor present Nurse Communication: Mobility status PT Visit Diagnosis: Difficulty in walking, not elsewhere classified (R26.2)      Time: 1610-96041512-1527 PT Time Calculation (min) (ACUTE ONLY): 15 min  Charges:  $Gait Training: 8-22 mins                    G Codes:       Pg [phone]    Jailin Manocchio 01/16/2017, 3:42 PM

## 2017-01-16 NOTE — Addendum Note (Signed)
Addendum  created 01/16/17 0631 by Elyn PeersAllen, Jamy Cleckler J, CRNA   Charge Capture section accepted

## 2017-01-16 NOTE — Evaluation (Signed)
Physical Therapy Evaluation Patient Details Name: Ivan Jones MRN: [id number] DOB: October 02, 1975 Today's Date: 01/16/2017   History of Present Illness  Pt s/p R TKR  Clinical Impression  Pt s/p R TKR and presents with decreased R LE strength/ROM and post op pain limiting functional mobility.  Pt should progress to dc home with family assist.    Follow Up Recommendations Home health PT    Equipment Recommendations  Rolling walker with 5" wheels    Recommendations for Other Services OT consult     Precautions / Restrictions Precautions Precautions: Knee;Fall Required Braces or Orthoses: Knee Immobilizer - Right Knee Immobilizer - Right: Discontinue once straight leg raise with < 10 degree lag Restrictions Weight Bearing Restrictions: No Other Position/Activity Restrictions: WBAT      Mobility  Bed Mobility Overal bed mobility: Needs Assistance Bed Mobility: Supine to Sit     Supine to sit: Min assist     General bed mobility comments: cues for sequence and use of L LE to self assist  Transfers Overall transfer level: Needs assistance Equipment used: Rolling walker (2 wheeled) Transfers: Sit to/from Stand Sit to Stand: Min assist         General transfer comment: cues for LE management and use of UEs to self assist  Ambulation/Gait Ambulation/Gait assistance: Min assist Ambulation Distance (Feet): 25 Feet Assistive device: Rolling walker (2 wheeled) Gait Pattern/deviations: Step-to pattern;Decreased step length - right;Decreased step length - left;Shuffle;Trunk flexed Gait velocity: decr Gait velocity interpretation: Below normal speed for age/gender General Gait Details: cues for posture, sequence and position from [employer]  Stairs            Wheelchair Mobility    Modified Rankin (Stroke Patients Only)       Balance Overall balance assessment: No apparent balance deficits (not formally assessed)                                          Pertinent Vitals/Pain Pain Assessment: 0-10 Pain Score: 7  Pain Location: R knee Pain Descriptors / Indicators: Aching;Sore Pain Intervention(s): Limited activity within patient's tolerance;Monitored during session;Premedicated before session;Ice applied    Home Living Family/patient expects to be discharged to:: Private residence Living Arrangements: Spouse/significant other Available Help at Discharge: Family Type of Home: House Home Access: Stairs to enter Entrance Stairs-Rails: Right;Left;Can reach both Secretary/administratorntrance Stairs-Number of Steps: 3 Home Layout: One level Home Equipment: Crutches      Prior Function Level of Independence: Independent               Hand Dominance        Extremity/Trunk Assessment   Upper Extremity Assessment Upper Extremity Assessment: Overall WFL for tasks assessed    Lower Extremity Assessment Lower Extremity Assessment: RLE deficits/detail RLE Deficits / Details: 2/5 quads with AAROM at knee -10 - 35 - pain limited    Cervical / Trunk Assessment Cervical / Trunk Assessment: Normal  Communication   Communication: No difficulties  Cognition Arousal/Alertness: Awake/alert Behavior During Therapy: WFL for tasks assessed/performed Overall Cognitive Status: Within Functional Limits for tasks assessed                                        General Comments      Exercises Total Joint Exercises Ankle Circles/Pumps: AROM;Both;15 reps;Supine Quad  Sets: AROM;Both;10 reps;Supine Heel Slides: AAROM;Right;10 reps;Supine Straight Leg Raises: AAROM;Right;10 reps;Supine   Assessment/Plan    PT Assessment Patient needs continued PT services  PT Problem List Decreased strength;Decreased range of motion;Decreased activity tolerance;Decreased mobility;Decreased knowledge of use of DME;Pain       PT Treatment Interventions DME instruction;Gait training;Stair training;Functional mobility training;Therapeutic  activities;Therapeutic exercise;Patient/family education    PT Goals (Current goals can be found in the Care Plan section)  Acute Rehab PT Goals Patient Stated Goal: Regain IND and get back to work PT Goal Formulation: With patient Time For Goal Achievement: 01/23/17 Potential to Achieve Goals: Good    Frequency 7X/week   Barriers to discharge        Co-evaluation               AM-PAC PT "6 Clicks" Daily Activity  Outcome Measure Difficulty turning over in bed (including adjusting bedclothes, sheets and blankets)?: Unable Difficulty moving from lying on back to sitting on the side of the bed? : Unable Difficulty sitting down on and standing up from a chair with arms (e.g., wheelchair, bedside commode, etc,.)?: A Lot Help needed moving to and from a bed to chair (including a wheelchair)?: A Little Help needed walking in hospital room?: A Little Help needed climbing 3-5 steps with a railing? : A Little 6 Click Score: 13    End of Session Equipment Utilized During Treatment: Gait belt;Right knee immobilizer Activity Tolerance: Patient tolerated treatment well Patient left: in chair;with call bell/phone within reach;with family/visitor present Nurse Communication: Mobility status PT Visit Diagnosis: Difficulty in walking, not elsewhere classified (R26.2)    Time: 5409-81190920-0944 PT Time Calculation (min) (ACUTE ONLY): 24 min   Charges:   PT Evaluation $PT Eval Low Complexity: 1 Low PT Treatments $Therapeutic Exercise: 8-22 mins   PT G Codes:        Pg [phone]   Beth Spackman 01/16/2017, 12:23 PM

## 2017-01-16 NOTE — Progress Notes (Signed)
OT Cancellation Note  Patient Details Name: Ivan Jones MRN: [id number] DOB: 01-Apr-1975   Cancelled Treatment:    Reason Eval/Treat Not Completed: Other (comment).  Pt with increased pain this am. Will return in pm.  Janaiya Beauchesne 01/16/2017, 12:13 PM  Marica OtterMaryellen Baptiste Littler, OTR/L [phone] 01/16/2017

## 2017-01-16 NOTE — Progress Notes (Signed)
Patient ID: Ivan Jones, male   DOB: 03/28/1975, 41 y.o.   MRN: [id number]   Subjective: 1 Day Post-Op Procedure(s) (LRB): RIGHT TOTAL KNEE ARTHROPLASTY (Right) Patient reports pain as severe.  Reports 15mg  Oxy not helping. He was taking 15mg  at a time the last few weeks and has been on Percocet many years. Some nausea, which has improved now. No other c/o.  Patient has complaints of R knee pain  We will start therapy today. Plan is to go home with HHPT after hospital stay.  Objective: Vital signs in last 24 hours: Temp:  [97.6 F (36.4 C)-99 F (37.2 C)] 97.9 F (36.6 C) (12/14 0520) Pulse Rate:  [71-101] 98 (12/14 0800) Resp:  [10-26] 14 (12/14 0520) BP: (126-166)/(77-108) 166/89 (12/14 0800) SpO2:  [95 %-100 %] 97 % (12/14 0520) Weight:  [109.8 kg (242 lb)] 109.8 kg (242 lb) (12/13 0857)  Intake/Output from previous day:  Intake/Output Summary (Last 24 hours) at 01/16/2017 0826 Last data filed at 01/16/2017 0600 Gross per 24 hour  Intake 3255 ml  Output 3150 ml  Net 105 ml    Intake/Output this shift: No intake/output data recorded.  Labs: Results for orders placed or performed during the hospital encounter of 01/15/17  Glucose, capillary  Result Value Ref Range   Glucose-Capillary 126 (H) 65 - 99 mg/dL   Comment 1 Notify RN   Glucose, capillary  Result Value Ref Range   Glucose-Capillary 133 (H) 65 - 99 mg/dL  CBC  Result Value Ref Range   WBC 21.4 (H) 4.0 - 10.5 K/uL   RBC 4.05 (L) 4.22 - 5.81 MIL/uL   Hemoglobin 12.2 (L) 13.0 - 17.0 g/dL   HCT 40.936.7 (L) 81.139.0 - 91.452.0 %   MCV 90.6 78.0 - 100.0 fL   MCH 30.1 26.0 - 34.0 pg   MCHC 33.2 30.0 - 36.0 g/dL   RDW 78.212.2 95.611.5 - 21.315.5 %   Platelets 283 150 - 400 K/uL  Basic metabolic panel  Result Value Ref Range   Sodium 136 135 - 145 mmol/L   Potassium 3.9 3.5 - 5.1 mmol/L   Chloride 100 (L) 101 - 111 mmol/L   CO2 27 22 - 32 mmol/L   Glucose, Bld 173 (H) 65 - 99 mg/dL   BUN 17 6 - 20 mg/dL   Creatinine, Ser 0.860.80  0.61 - 1.24 mg/dL   Calcium 9.1 8.9 - 57.810.3 mg/dL   GFR calc non Af Amer >60 >60 mL/min   GFR calc Af Amer >60 >60 mL/min   Anion gap 9 5 - 15  Glucose, capillary  Result Value Ref Range   Glucose-Capillary 226 (H) 65 - 99 mg/dL  Glucose, capillary  Result Value Ref Range   Glucose-Capillary 180 (H) 65 - 99 mg/dL  Glucose, capillary  Result Value Ref Range   Glucose-Capillary 187 (H) 65 - 99 mg/dL  [facility] and [facility]  Result Value Ref Range   ABO/RH(D) O POS    Antibody Screen NEG    Sample Expiration 01/18/2017   ABO/Rh  Result Value Ref Range   ABO/RH(D) O POS     Exam - Neurologically intact ABD soft Neurovascular intact Sensation intact distally Intact pulses distally Dorsiflexion/Plantar flexion intact Incision: dressing C/D/I and no drainage No cellulitis present Compartment soft no calf pain or sign of DVT Dressing - clean, dry, no drainage Motor function intact - moving foot and toes well on exam.   Assessment/Plan: 1 Day Post-Op Procedure(s) (LRB): RIGHT  TOTAL KNEE ARTHROPLASTY (Right)  Advance diet Up with therapy D/C IV fluids Past Medical History:  Diagnosis Date  . Arthritis    knees and shoulder  . Complication of anesthesia    wakes up to fast   . Diabetes mellitus without complication (HCC)    type 2  . History of kidney stones   . Hypertension     DVT Prophylaxis - ASA 325mg  BID Protocol Weight-Bearing as tolerated to Right leg No vaccines Switch to PO dilaudid instead of oxy for pain control  Ivan Jones M. 01/16/2017, 8:26 AM

## 2017-01-16 NOTE — Progress Notes (Signed)
Physical Therapy Treatment Patient Details Name: Ivan Jones MRN: [id number] DOB: 10-08-1975 Today's Date: 01/16/2017    History of Present Illness Pt s/p R TKR    PT Comments    Therex deferred from previous session performed.   Follow Up Recommendations  Home health PT     Equipment Recommendations  Rolling walker with 5" wheels    Recommendations for Other Services OT consult     Precautions / Restrictions Precautions Precautions: Knee;Fall Required Braces or Orthoses: Knee Immobilizer - Right Knee Immobilizer - Right: Discontinue once straight leg raise with < 10 degree lag Restrictions Weight Bearing Restrictions: No Other Position/Activity Restrictions: WBAT    Mobility  Bed Mobility Overal bed mobility: Needs Assistance Bed Mobility: Supine to Sit;Sit to Supine     Supine to sit: Min assist Sit to supine: Min assist   General bed mobility comments: cues for sequence and use of L LE to self assist  Transfers Overall transfer level: Needs assistance Equipment used: Rolling walker (2 wheeled) Transfers: Sit to/from Stand Sit to Stand: Min assist         General transfer comment: cues for LE management and use of UEs to self assist  Ambulation/Gait Ambulation/Gait assistance: Min assist Ambulation Distance (Feet): 64 Feet Assistive device: Rolling walker (2 wheeled) Gait Pattern/deviations: Step-to pattern;Decreased step length - right;Decreased step length - left;Shuffle;Trunk flexed Gait velocity: decr Gait velocity interpretation: Below normal speed for age/gender General Gait Details: cues for posture, sequence and position from [employer]   Stairs            Wheelchair Mobility    Modified Rankin (Stroke Patients Only)       Balance Overall balance assessment: No apparent balance deficits (not formally assessed)                                          Cognition Arousal/Alertness: Awake/alert Behavior During  Therapy: WFL for tasks assessed/performed Overall Cognitive Status: Within Functional Limits for tasks assessed                                        Exercises Total Joint Exercises Ankle Circles/Pumps: AROM;Both;15 reps;Supine Quad Sets: AROM;Both;10 reps;Supine Heel Slides: AAROM;Right;Supine;15 reps Straight Leg Raises: AAROM;Right;10 reps;Supine    General Comments        Pertinent Vitals/Pain Pain Assessment: 0-10 Pain Score: 7  Pain Location: R knee Pain Descriptors / Indicators: Aching;Sore Pain Intervention(s): Limited activity within patient's tolerance;Monitored during session;Premedicated before session;Ice applied    Home Living                      Prior Function            PT Goals (current goals can now be found in the care plan section) Acute Rehab PT Goals Patient Stated Goal: Regain IND and get back to work PT Goal Formulation: With patient Time For Goal Achievement: 01/23/17 Potential to Achieve Goals: Good Progress towards PT goals: Progressing toward goals    Frequency    7X/week      PT Plan Current plan remains appropriate    Co-evaluation              AM-PAC PT "6 Clicks" Daily Activity  Outcome Measure  Difficulty turning over in bed (  including adjusting bedclothes, sheets and blankets)?: Unable Difficulty moving from lying on back to sitting on the side of the bed? : Unable Difficulty sitting down on and standing up from a chair with arms (e.g., wheelchair, bedside commode, etc,.)?: A Lot Help needed moving to and from a bed to chair (including a wheelchair)?: A Little Help needed walking in hospital room?: A Little Help needed climbing 3-5 steps with a railing? : A Little 6 Click Score: 13    End of Session Equipment Utilized During Treatment: Gait belt;Right knee immobilizer Activity Tolerance: Patient tolerated treatment well Patient left: in bed;with call bell/phone within reach;with  family/visitor present Nurse Communication: Mobility status PT Visit Diagnosis: Difficulty in walking, not elsewhere classified (R26.2)     Time: 1308-65781512-1527 PT Time Calculation (min) (ACUTE ONLY): 15 min  Charges:  $Gait Training: 8-22 mins $Therapeutic Exercise: 8-22 mins                    G Codes:       Pg [phone]    Ivan Jones 01/16/2017, 3:45 PM

## 2017-01-16 NOTE — Progress Notes (Signed)
Discharge planning, spoke with patient and spouse at bedside. Have chosen [facility] for HH PT. Contacted [facility] for referral. Needs RW and 3n1, contacted [facility] to deliver to room. [phone] 

## 2017-01-17 LAB — GLUCOSE, CAPILLARY: Glucose-Capillary: 211 mg/dL — ABNORMAL HIGH (ref 65–99)

## 2017-01-17 LAB — CBC
HEMATOCRIT: 37.4 % — AB (ref 39.0–52.0)
HEMOGLOBIN: 12.4 g/dL — AB (ref 13.0–17.0)
MCH: 30.2 pg (ref 26.0–34.0)
MCHC: 33.2 g/dL (ref 30.0–36.0)
MCV: 91.2 fL (ref 78.0–100.0)
Platelets: 283 10*3/uL (ref 150–400)
RBC: 4.1 MIL/uL — ABNORMAL LOW (ref 4.22–5.81)
RDW: 12.4 % (ref 11.5–15.5)
WBC: 19.7 10*3/uL — AB (ref 4.0–10.5)

## 2017-01-17 NOTE — Progress Notes (Signed)
Physical Therapy Treatment Patient Details Name: Ivan GlossDavid Gloor MRN: [id number] DOB: [birth year] Today's Date: 01/17/2017    History of Present Illness Pt s/p R TKR    PT Comments    Pt motivated and progressing well with mobility but continues to struggle to improve ROM and quad strength.  Spouse present and reviewed don/doff KI, stairs and home therex with written instruction provided.   Follow Up Recommendations  Home health PT     Equipment Recommendations  Rolling walker with 5" wheels    Recommendations for Other Services OT consult     Precautions / Restrictions Precautions Precautions: Knee;Fall Required Braces or Orthoses: Knee Immobilizer - Right Knee Immobilizer - Right: Discontinue once straight leg raise with < 10 degree lag Restrictions Weight Bearing Restrictions: No Other Position/Activity Restrictions: WBAT    Mobility  Bed Mobility         Supine to sit: Min assist     General bed mobility comments: NT - Pt up in chair and requests back to same  Transfers Overall transfer level: Needs assistance Equipment used: Rolling walker (2 wheeled) Transfers: Sit to/from Stand Sit to Stand: Supervision         General transfer comment: cues to extend leg prior to sitting  Ambulation/Gait Ambulation/Gait assistance: Min guard;Supervision Ambulation Distance (Feet): 100 Feet Assistive device: Rolling walker (2 wheeled) Gait Pattern/deviations: Step-to pattern;Decreased step length - right;Decreased step length - left;Shuffle;Trunk flexed Gait velocity: decr Gait velocity interpretation: Below normal speed for age/gender General Gait Details: min cues for posture, sequence and position from RW   Stairs Stairs: Yes   Stair Management: Two rails;Step to pattern;Forwards Number of Stairs: 5 General stair comments: cues for sequence and foot placement  Wheelchair Mobility    Modified Rankin (Stroke Patients Only)       Balance Overall balance  assessment: No apparent balance deficits (not formally assessed)                                          Cognition Arousal/Alertness: Awake/alert Behavior During Therapy: WFL for tasks assessed/performed Overall Cognitive Status: Within Functional Limits for tasks assessed                                        Exercises Total Joint Exercises Ankle Circles/Pumps: AROM;Both;15 reps;Supine Quad Sets: AROM;Both;Supine;15 reps Heel Slides: AAROM;Right;Supine;15 reps Straight Leg Raises: AAROM;Right;Supine;20 reps Knee Flexion: AAROM;Right;15 reps;Seated Goniometric ROM: AAROM R knee -10 - 40    General Comments        Pertinent Vitals/Pain Pain Assessment: 0-10 Pain Score: 6  Pain Location: R knee Pain Descriptors / Indicators: Sore Pain Intervention(s): Limited activity within patient's tolerance;Monitored during session;Premedicated before session;Ice applied    Home Living Family/patient expects to be discharged to:: Private residence Living Arrangements: Spouse/significant other Available Help at Discharge: Family         Home Equipment: Shower seat - built in(3:1 delivered)      Prior Function Level of Independence: Independent          PT Goals (current goals can now be found in the care plan section) Acute Rehab PT Goals Patient Stated Goal: Regain IND and get back to work PT Goal Formulation: With patient Time For Goal Achievement: 01/23/17 Potential to Achieve Goals: Good Progress towards PT  goals: Progressing toward goals    Frequency    7X/week      PT Plan Current plan remains appropriate    Co-evaluation              AM-PAC PT "6 Clicks" Daily Activity  Outcome Measure  Difficulty turning over in bed (including adjusting bedclothes, sheets and blankets)?: A Lot Difficulty moving from lying on back to sitting on the side of the bed? : A Lot Difficulty sitting down on and standing up from a chair  with arms (e.g., wheelchair, bedside commode, etc,.)?: A Lot Help needed moving to and from a bed to chair (including a wheelchair)?: A Little Help needed walking in hospital room?: A Little Help needed climbing 3-5 steps with a railing? : A Little 6 Click Score: 15    End of Session Equipment Utilized During Treatment: Gait belt;Right knee immobilizer Activity Tolerance: Patient tolerated treatment well Patient left: in chair;with call bell/phone within reach;with family/visitor present Nurse Communication: Mobility status PT Visit Diagnosis: Difficulty in walking, not elsewhere classified (R26.2)     Time: 5366-44031040-1118 PT Time Calculation (min) (ACUTE ONLY): 38 min  Charges:  $Gait Training: 8-22 mins $Therapeutic Exercise: 8-22 mins $Therapeutic Activity: 8-22 mins                    G Codes:       Pg [phone]    Smita Lesh 01/17/2017, 12:00 PM

## 2017-01-17 NOTE — Evaluation (Signed)
Occupational Therapy Evaluation Patient Details Name: Ivan GlossDavid Cimini MRN: [id number] DOB: [birth year] Today's Date: 01/17/2017    History of Present Illness Pt s/p R TKR   Clinical Impression   This 41 year old man was admitted for the above sx. All education was completed. No further OT is needed at this time    Follow Up Recommendations  Supervision/Assistance - 24 hour    Equipment Recommendations  3 in 1 bedside commode(delivered)    Recommendations for Other Services       Precautions / Restrictions Precautions Precautions: Knee;Fall Required Braces or Orthoses: Knee Immobilizer - Right Knee Immobilizer - Right: Discontinue once straight leg raise with < 10 degree lag Restrictions Weight Bearing Restrictions: No Other Position/Activity Restrictions: WBAT      Mobility Bed Mobility         Supine to sit: Min assist     General bed mobility comments: assist for RLE  Transfers   Equipment used: Rolling walker (2 wheeled)   Sit to Stand: Supervision         General transfer comment: cues to extend leg prior to sitting    Balance                                           ADL either performed or assessed with clinical judgement   ADL Overall ADL's : Needs assistance/impaired Eating/Feeding: Independent   Grooming: Oral care;Supervision/safety;Standing   Upper Body Bathing: Set up;Sitting   Lower Body Bathing: Minimal assistance;Sit to/from stand   Upper Body Dressing : Set up;Sitting   Lower Body Dressing: Minimal assistance;Sit to/from stand   Toilet Transfer: Supervision/safety;Ambulation;RW(chair)   Toileting- ArchitectClothing Manipulation and Hygiene: Supervision/safety;Sit to/from stand         General ADL Comments: educated on shower transfer; pt did not feel he needed to practice. Educated on precautions.  Wife or her mother will be with him.     Vision         Perception     Praxis      Pertinent Vitals/Pain Pain  Score: 4  Pain Location: R knee Pain Descriptors / Indicators: Sore Pain Intervention(s): Limited activity within patient's tolerance;Monitored during session;Premedicated before session;Repositioned;Ice applied     Hand Dominance     Extremity/Trunk Assessment Upper Extremity Assessment Upper Extremity Assessment: Overall WFL for tasks assessed           Communication Communication Communication: No difficulties   Cognition Arousal/Alertness: Awake/alert Behavior During Therapy: WFL for tasks assessed/performed Overall Cognitive Status: Within Functional Limits for tasks assessed                                     General Comments       Exercises     Shoulder Instructions      Home Living Family/patient expects to be discharged to:: Private residence Living Arrangements: Spouse/significant other Available Help at Discharge: Family               Bathroom Shower/Tub: Walk-in Human resources officershower   Bathroom Toilet: Standard     Home Equipment: Shower seat - built in(3:1 delivered)          Prior Functioning/Environment Level of Independence: Independent                 OT Problem List:  OT Treatment/Interventions:      OT Goals(Current goals can be found in the care plan section) Acute Rehab OT Goals Patient Stated Goal: Regain IND and get back to work OT Goal Formulation: All assessment and education complete, DC therapy  OT Frequency:     Barriers to D/C:            Co-evaluation              AM-PAC PT "6 Clicks" Daily Activity     Outcome Measure Help from another person eating meals?: None Help from another person taking care of personal grooming?: A Little Help from another person toileting, which includes using toliet, bedpan, or urinal?: A Little Help from another person bathing (including washing, rinsing, drying)?: A Little Help from another person to put on and taking off regular upper body clothing?: A  Little Help from another person to put on and taking off regular lower body clothing?: A Little 6 Click Score: 19   End of Session    Activity Tolerance: Patient tolerated treatment well Patient left: in chair;with call bell/phone within reach;with family/visitor present  OT Visit Diagnosis: Pain Pain - Right/Left: Right Pain - part of body: Knee                Time: [id number] OT Time Calculation (min): 23 min Charges:  OT General Charges $OT Visit: 1 Visit OT Evaluation $OT Eval Low Complexity: 1 Low G-Codes:     RichardsMaryellen Dellis Voght, OTR/L [phone] 01/17/2017  Devlyn Parish 01/17/2017, 9:55 AM

## 2017-01-17 NOTE — Progress Notes (Signed)
     Subjective: 2 Days Post-Op Procedure(s) (LRB): RIGHT TOTAL KNEE ARTHROPLASTY (Right)   Patient reports pain as mild, pain under better control with change in medication.  No events throughout the night. Ready to be discharged home if he does well with PT.  Objective:   VITALS:   Vitals:   01/16/17 2231 01/17/17 0535  BP: (!) 156/88 (!) 152/91  Pulse: 93 100  Resp: 17 17  Temp: 98.2 F (36.8 C) 98.7 F (37.1 C)  SpO2: 97% 95%    Dorsiflexion/Plantar flexion intact Incision: dressing C/D/I No cellulitis present Compartment soft  LABS Recent Labs    01/16/17 0536 01/17/17 0526  HGB 12.2* 12.4*  HCT 36.7* 37.4*  WBC 21.4* 19.7*  PLT 283 283    Recent Labs    01/16/17 0536  NA 136  K 3.9  BUN 17  CREATININE 0.80  GLUCOSE 173*     Assessment/Plan: 2 Days Post-Op Procedure(s) (LRB): RIGHT TOTAL KNEE ARTHROPLASTY (Right) Up with therapy Discharge home [facility] Follow up with Dr. Shelle IronBeane in 2 weeks.  Contact information:  [facility] [address] [place] [id number] [phone]        Anastasio AuerbachMatthew S. Reeves Musick   PAC  01/17/2017, 9:01 AM

## 2017-01-17 NOTE — Care Management Note (Signed)
Case Management Note  Patient Details  Name: Ivan Jones MRN: [id number] Date of Birth: 1975-12-14  Subjective/Objective:     S/p R TKR               Action/Plan: Discharge Planning:  Please see previous NCM notes. [facility]    Expected Discharge Date:  01/17/17               Expected Discharge Plan:  Home w Home Health Services  In-House Referral:  NA  Discharge planning Services     Post Acute Care Choice:  Home Health, Durable Medical Equipment Choice offered to:  Patient  DME Arranged:  3-N-1, Walker rolling DME Agency:  [facility]  HH Arranged:  PT HH Agency[facility]  [facility]  Status of Service:  Completed, signed off  If discussed at [employer] Length of Stay Meetings, dates discussed:    Additional Comments:  Elliot CousinShavis, Eyden Dobie Ellen, RN 01/17/2017, 1:12 PM

## 2017-01-18 NOTE — Discharge Summary (Signed)
Physician Discharge Summary   Patient ID: Ivan Jones MRN: [id number] DOB/AGE: November 30, 1975 41 y.o.  Admit date: 01/15/2017 Discharge date: 01/17/2017  Primary Diagnosis: right knee post-traumatic arthritis  Admission Diagnoses:  Past Medical History:  Diagnosis Date  . Arthritis    knees and shoulder  . Complication of anesthesia    wakes up to fast   . Diabetes mellitus without complication ([place])    type 2  . History of kidney stones   . Hypertension    Discharge Diagnoses:   Principal Problem:   Post-traumatic osteoarthritis of right knee Active Problems:   Right knee DJD  Estimated body mass index is 35.74 kg/m as calculated from the following:   Height as of this encounter: 5' 9" (1.753 m).   Weight as of this encounter: 109.8 kg (242 lb).  Procedure:  Procedure(s) (LRB): RIGHT TOTAL KNEE ARTHROPLASTY (Right)   Consults: None  HPI: see H&P Laboratory Data: Admission on 01/15/2017, Discharged on 01/17/2017  Component Date Value Ref Range Status  . Glucose-Capillary 01/15/2017 126* 65 - 99 mg/dL Final  . Comment 1 01/15/2017 Notify RN   Final  . ABO/RH(D) 01/15/2017 O POS   Final  . Antibody Screen 01/15/2017 NEG   Final  . Sample Expiration 01/15/2017 01/18/2017   Final  . ABO/RH(D) 01/15/2017 O POS   Final  . Glucose-Capillary 01/15/2017 133* 65 - 99 mg/dL Final  . WBC 01/16/2017 21.4* 4.0 - 10.5 K/uL Final  . RBC 01/16/2017 4.05* 4.22 - 5.81 MIL/uL Final  . Hemoglobin 01/16/2017 12.2* 13.0 - 17.0 g/dL Final  . HCT 01/16/2017 36.7* 39.0 - 52.0 % Final  . MCV 01/16/2017 90.6  78.0 - 100.0 fL Final  . MCH 01/16/2017 30.1  26.0 - 34.0 pg Final  . MCHC 01/16/2017 33.2  30.0 - 36.0 g/dL Final  . RDW 01/16/2017 12.2  11.5 - 15.5 % Final  . Platelets 01/16/2017 283  150 - 400 K/uL Final  . Sodium 01/16/2017 136  135 - 145 mmol/L Final  . Potassium 01/16/2017 3.9  3.5 - 5.1 mmol/L Final  . Chloride 01/16/2017 100* 101 - 111 mmol/L Final  . CO2 01/16/2017 27  22  - 32 mmol/L Final  . Glucose, Bld 01/16/2017 173* 65 - 99 mg/dL Final  . BUN 01/16/2017 17  6 - 20 mg/dL Final  . Creatinine, Ser 01/16/2017 0.80  0.61 - 1.24 mg/dL Final  . Calcium 01/16/2017 9.1  8.9 - 10.3 mg/dL Final  . GFR calc non Af Amer 01/16/2017 >60  >60 mL/min Final  . GFR calc Af Amer 01/16/2017 >60  >60 mL/min Final   Comment: (NOTE) The eGFR has been calculated using the CKD EPI equation. This calculation has not been validated in all clinical situations. eGFR's persistently <60 mL/min signify possible Chronic Kidney Disease.   . Anion gap 01/16/2017 9  5 - 15 Final  . Glucose-Capillary 01/15/2017 226* 65 - 99 mg/dL Final  . Glucose-Capillary 01/15/2017 180* 65 - 99 mg/dL Final  . Glucose-Capillary 01/16/2017 187* 65 - 99 mg/dL Final  . Glucose-Capillary 01/16/2017 191* 65 - 99 mg/dL Final  . WBC 01/17/2017 19.7* 4.0 - 10.5 K/uL Final  . RBC 01/17/2017 4.10* 4.22 - 5.81 MIL/uL Final  . Hemoglobin 01/17/2017 12.4* 13.0 - 17.0 g/dL Final  . HCT 01/17/2017 37.4* 39.0 - 52.0 % Final  . MCV 01/17/2017 91.2  78.0 - 100.0 fL Final  . MCH 01/17/2017 30.2  26.0 - 34.0 pg Final  . MCHC 01/17/2017  33.2  30.0 - 36.0 g/dL Final  . RDW 01/17/2017 12.4  11.5 - 15.5 % Final  . Platelets 01/17/2017 283  150 - 400 K/uL Final  . Glucose-Capillary 01/16/2017 190* 65 - 99 mg/dL Final  . Glucose-Capillary 01/16/2017 285* 65 - 99 mg/dL Final  . Glucose-Capillary 01/17/2017 211* 65 - 99 mg/dL Final  Hospital Outpatient Visit on 01/14/2017  Component Date Value Ref Range Status  . aPTT 01/14/2017 31  24 - 36 seconds Final  . Sodium 01/14/2017 138  135 - 145 mmol/L Final  . Potassium 01/14/2017 4.2  3.5 - 5.1 mmol/L Final  . Chloride 01/14/2017 101  101 - 111 mmol/L Final  . CO2 01/14/2017 28  22 - 32 mmol/L Final  . Glucose, Bld 01/14/2017 141* 65 - 99 mg/dL Final  . BUN 01/14/2017 22* 6 - 20 mg/dL Final  . Creatinine, Ser 01/14/2017 1.00  0.61 - 1.24 mg/dL Final  . Calcium 01/14/2017  9.7  8.9 - 10.3 mg/dL Final  . GFR calc non Af Amer 01/14/2017 >60  >60 mL/min Final  . GFR calc Af Amer 01/14/2017 >60  >60 mL/min Final   Comment: (NOTE) The eGFR has been calculated using the CKD EPI equation. This calculation has not been validated in all clinical situations. eGFR's persistently <60 mL/min signify possible Chronic Kidney Disease.   . Anion gap 01/14/2017 9  5 - 15 Final  . WBC 01/14/2017 11.1* 4.0 - 10.5 K/uL Final  . RBC 01/14/2017 4.61  4.22 - 5.81 MIL/uL Final  . Hemoglobin 01/14/2017 14.1  13.0 - 17.0 g/dL Final  . HCT 01/14/2017 41.9  39.0 - 52.0 % Final  . MCV 01/14/2017 90.9  78.0 - 100.0 fL Final  . MCH 01/14/2017 30.6  26.0 - 34.0 pg Final  . MCHC 01/14/2017 33.7  30.0 - 36.0 g/dL Final  . RDW 01/14/2017 12.5  11.5 - 15.5 % Final  . Platelets 01/14/2017 293  150 - 400 K/uL Final  . Prothrombin Time 01/14/2017 13.0  11.4 - 15.2 seconds Final  . INR 01/14/2017 0.99   Final  . Color, Urine 01/14/2017 YELLOW  YELLOW Final  . APPearance 01/14/2017 CLEAR  CLEAR Final  . Specific Gravity, Urine 01/14/2017 1.025  1.005 - 1.030 Final  . pH 01/14/2017 6.0  5.0 - 8.0 Final  . Glucose, UA 01/14/2017 NEGATIVE  NEGATIVE mg/dL Final  . Hgb urine dipstick 01/14/2017 NEGATIVE  NEGATIVE Final  . Bilirubin Urine 01/14/2017 NEGATIVE  NEGATIVE Final  . Ketones, ur 01/14/2017 NEGATIVE  NEGATIVE mg/dL Final  . Protein, ur 01/14/2017 NEGATIVE  NEGATIVE mg/dL Final  . Nitrite 01/14/2017 NEGATIVE  NEGATIVE Final  . Leukocytes, UA 01/14/2017 NEGATIVE  NEGATIVE Final  . MRSA, PCR 01/14/2017 NEGATIVE  NEGATIVE Final  . Staphylococcus aureus 01/14/2017 NEGATIVE  NEGATIVE Final   Comment: (NOTE) The Xpert SA Assay (FDA approved for NASAL specimens in patients 50 years of age and older), is one component of a comprehensive surveillance program. It is not intended to diagnose infection nor to guide or monitor treatment.      X-Rays:Dg Knee 1-2 Views Right  Result Date:  01/14/2017 CLINICAL DATA:  Preoperative examination prior to right total knee joint prosthesis placement. History of post traumatic osteoarthritis and right knee pain. EXAM: RIGHT KNEE - 1-2 VIEW COMPARISON:  None in PACs FINDINGS: There is marked narrowing of the lateral joint compartment. Large spurs arise from the articular margins of the lateral femoral condyle and lateral tibial plateau. There  is mild beaking of the tibial spines. The medial and patellofemoral joint spaces are well-maintained. Spurs arise from the articular margins of the patella. IMPRESSION: Severe hip joint space loss of the lateral compartment with valgus positioning of the leg with respect to the femur. Mild degenerative spurring of the patellofemoral compartment. No acute bony abnormality. Electronically Signed   By: Lucius  Martinique M.D.   On: 01/14/2017 09:58   Dg Knee Right Port  Result Date: 01/15/2017 CLINICAL DATA:  Status post total knee replacement EXAM: PORTABLE RIGHT KNEE - 1-2 VIEW COMPARISON:  January 14, 2017 FINDINGS: Frontal and lateral views were obtained. There is a total knee replacement with femoral and tibial prosthetic components appearing well-seated. There is no acute fracture or dislocation. Fluid and air are noted in the knee joint, findings that may be of postoperative etiology. No erosive change. IMPRESSION: Status post total knee replacement with prosthetic components well-seated. No acute fracture or dislocation. Electronically Signed   By: Lowella Grip III M.D.   On: 01/15/2017 14:27    EKG: Orders placed or performed during the hospital encounter of 01/14/17  . EKG 12-Lead  . EKG 12-Lead     Hospital Course: Ivan Jones is a 41 y.o. who was admitted to [facility]. They were brought to the operating room on 01/15/2017 and underwent Procedure(s): RIGHT TOTAL KNEE ARTHROPLASTY.  Patient tolerated the procedure well and was later transferred to the recovery room and then to the  orthopaedic floor for postoperative care.  They were given PO and IV analgesics for pain control following their surgery.  They were given 24 hours of postoperative antibiotics of  Anti-infectives (From admission, onward)   Start     Dose/Rate Route Frequency Ordered Stop   01/15/17 1715  ceFAZolin (ANCEF) IVPB 2g/100 mL premix     2 g 200 mL/hr over 30 Minutes Intravenous Every 6 hours 01/15/17 1402 01/15/17 2347   01/15/17 1154  polymyxin B 500,000 Units, bacitracin 50,000 Units in sodium chloride 0.9 % 500 mL irrigation  Status:  Discontinued       As needed 01/15/17 1154 01/15/17 1250   01/15/17 0948  ceFAZolin (ANCEF) 2-4 GM/100ML-% IVPB    Comments:  Bridget Hartshorn   : cabinet override      01/15/17 0948 01/15/17 2159   01/15/17 0815  ceFAZolin (ANCEF) IVPB 2g/100 mL premix     2 g 200 mL/hr over 30 Minutes Intravenous On call to O.R. 01/15/17 [id number] 01/15/17 1025     and started on DVT prophylaxis in the form of Aspirin, TED hose and SCDs.   PT and OT were ordered for total joint protocol.  Discharge planning consulted to help with postop disposition and equipment needs.  Patient had a difficult night on the evening of surgery.  They started to get up OOB with therapy on day one. Continued to work with therapy into day two. By day two, pain was better controlled with switching to dilaudid PO for pain, the patient had progressed with therapy and meeting their goals.  Incision was healing well.  Patient was seen in rounds and was ready to go home.   Diet: Diabetic diet Activity:WBAT Follow-up:in 10-14 days Disposition - Home Discharged Condition: good    Allergies as of 01/17/2017   No Known Allergies     Medication List    STOP taking these medications   ibuprofen 800 MG tablet Commonly known as:  ADVIL,MOTRIN   oxyCODONE-acetaminophen 5-325 MG tablet Commonly known as:  PERCOCET/ROXICET     TAKE these medications   aspirin EC 325 MG tablet Take 1 tablet (325 mg total)  by mouth 2 (two) times daily.   docusate sodium 100 MG capsule Commonly known as:  COLACE Take 1 capsule (100 mg total) by mouth 2 (two) times daily as needed for mild constipation.   HYDROmorphone 2 MG tablet Commonly known as:  DILAUDID Take 1-2 tablets (2-4 mg total) by mouth every 3 (three) hours as needed for severe pain.   JANUMET XR 50-1000 MG Tb24 Generic drug:  SitaGLIPtin-MetFORMIN HCl Take 1 tablet by mouth 2 (two) times daily.   losartan 100 MG tablet Commonly known as:  COZAAR Take 100 mg by mouth daily.   methocarbamol 500 MG tablet Commonly known as:  ROBAXIN Take 1 tablet (500 mg total) by mouth every 6 (six) hours as needed for muscle spasms.   polyethylene glycol packet Commonly known as:  MIRALAX / GLYCOLAX Take 17 g by mouth daily.      Follow-up Information    Susa Day, MD Follow up in 2 week(s).   Specialty:  Orthopedic Surgery Contact information: [address] [place] [address] [phone]        Susa Day, MD Follow up.   Specialty:  Orthopedic Surgery Contact information: [address] [place] [address] [phone]        [facility] Follow [facility].   Specialty:  [place] Why:  Home Health Physical Therapy- agency will call to arrange initial visit Contact information: [place] [address] [phone]           Signed: Lacie Draft, PA-C Orthopaedic Surgery 01/18/2017, 10:17 PM

## 2019-03-15 DIAGNOSIS — N200 Calculus of kidney: Secondary | ICD-10-CM | POA: Diagnosis not present

## 2019-04-08 DIAGNOSIS — M25511 Pain in right shoulder: Secondary | ICD-10-CM | POA: Diagnosis not present

## 2019-04-20 DIAGNOSIS — M25511 Pain in right shoulder: Secondary | ICD-10-CM | POA: Diagnosis not present

## 2019-04-27 DIAGNOSIS — M19011 Primary osteoarthritis, right shoulder: Secondary | ICD-10-CM | POA: Diagnosis not present

## 2019-04-27 DIAGNOSIS — M25511 Pain in right shoulder: Secondary | ICD-10-CM | POA: Diagnosis not present

## 2019-04-27 DIAGNOSIS — M75101 Unspecified rotator cuff tear or rupture of right shoulder, not specified as traumatic: Secondary | ICD-10-CM | POA: Diagnosis not present

## 2019-05-19 DIAGNOSIS — M25511 Pain in right shoulder: Secondary | ICD-10-CM | POA: Diagnosis not present

## 2019-06-16 DIAGNOSIS — M19011 Primary osteoarthritis, right shoulder: Secondary | ICD-10-CM | POA: Diagnosis not present

## 2019-06-16 DIAGNOSIS — M25511 Pain in right shoulder: Secondary | ICD-10-CM | POA: Diagnosis not present

## 2019-10-03 DIAGNOSIS — J40 Bronchitis, not specified as acute or chronic: Secondary | ICD-10-CM | POA: Diagnosis not present

## 2019-10-03 DIAGNOSIS — J329 Chronic sinusitis, unspecified: Secondary | ICD-10-CM | POA: Diagnosis not present

## 2019-10-03 DIAGNOSIS — Z20828 Contact with and (suspected) exposure to other viral communicable diseases: Secondary | ICD-10-CM | POA: Diagnosis not present

## 2019-10-24 DIAGNOSIS — Z23 Encounter for immunization: Secondary | ICD-10-CM | POA: Diagnosis not present

## 2019-12-02 DIAGNOSIS — I160 Hypertensive urgency: Secondary | ICD-10-CM | POA: Diagnosis not present

## 2019-12-02 DIAGNOSIS — Z79899 Other long term (current) drug therapy: Secondary | ICD-10-CM | POA: Diagnosis not present

## 2019-12-02 DIAGNOSIS — R079 Chest pain, unspecified: Secondary | ICD-10-CM

## 2019-12-02 DIAGNOSIS — R072 Precordial pain: Secondary | ICD-10-CM | POA: Diagnosis not present

## 2019-12-02 DIAGNOSIS — Z7984 Long term (current) use of oral hypoglycemic drugs: Secondary | ICD-10-CM | POA: Diagnosis not present

## 2019-12-02 DIAGNOSIS — I259 Chronic ischemic heart disease, unspecified: Secondary | ICD-10-CM | POA: Diagnosis not present

## 2019-12-02 DIAGNOSIS — I10 Essential (primary) hypertension: Secondary | ICD-10-CM | POA: Diagnosis not present

## 2019-12-02 DIAGNOSIS — R0789 Other chest pain: Secondary | ICD-10-CM | POA: Diagnosis not present

## 2019-12-02 DIAGNOSIS — R945 Abnormal results of liver function studies: Secondary | ICD-10-CM | POA: Diagnosis not present

## 2019-12-02 DIAGNOSIS — E119 Type 2 diabetes mellitus without complications: Secondary | ICD-10-CM | POA: Diagnosis not present

## 2019-12-02 DIAGNOSIS — M1710 Unilateral primary osteoarthritis, unspecified knee: Secondary | ICD-10-CM | POA: Diagnosis not present

## 2019-12-03 ENCOUNTER — Encounter (HOSPITAL_COMMUNITY): Payer: Self-pay | Admitting: Emergency Medicine

## 2019-12-03 ENCOUNTER — Inpatient Hospital Stay (HOSPITAL_COMMUNITY)
Admission: EM | Admit: 2019-12-03 | Discharge: 2019-12-09 | DRG: 099 | Disposition: A | Discharge status: Home / Self Care | Payer: BC Managed Care – PPO | Attending: Internal Medicine | Admitting: Internal Medicine

## 2019-12-03 ENCOUNTER — Other Ambulatory Visit: Payer: Self-pay

## 2019-12-03 ENCOUNTER — Emergency Department (HOSPITAL_COMMUNITY): Payer: BC Managed Care – PPO

## 2019-12-03 DIAGNOSIS — G959 Disease of spinal cord, unspecified: Secondary | ICD-10-CM | POA: Diagnosis not present

## 2019-12-03 DIAGNOSIS — E119 Type 2 diabetes mellitus without complications: Secondary | ICD-10-CM

## 2019-12-03 DIAGNOSIS — I10 Essential (primary) hypertension: Secondary | ICD-10-CM | POA: Diagnosis present

## 2019-12-03 DIAGNOSIS — Z6835 Body mass index (BMI) 35.0-35.9, adult: Secondary | ICD-10-CM

## 2019-12-03 DIAGNOSIS — R278 Other lack of coordination: Secondary | ICD-10-CM | POA: Diagnosis not present

## 2019-12-03 DIAGNOSIS — R202 Paresthesia of skin: Secondary | ICD-10-CM | POA: Diagnosis not present

## 2019-12-03 DIAGNOSIS — E1165 Type 2 diabetes mellitus with hyperglycemia: Secondary | ICD-10-CM | POA: Diagnosis not present

## 2019-12-03 DIAGNOSIS — Z8249 Family history of ischemic heart disease and other diseases of the circulatory system: Secondary | ICD-10-CM | POA: Diagnosis not present

## 2019-12-03 DIAGNOSIS — R29898 Other symptoms and signs involving the musculoskeletal system: Secondary | ICD-10-CM | POA: Diagnosis not present

## 2019-12-03 DIAGNOSIS — Z96651 Presence of right artificial knee joint: Secondary | ICD-10-CM | POA: Diagnosis present

## 2019-12-03 DIAGNOSIS — Y929 Unspecified place or not applicable: Secondary | ICD-10-CM

## 2019-12-03 DIAGNOSIS — G373 Acute transverse myelitis in demyelinating disease of central nervous system: Secondary | ICD-10-CM | POA: Diagnosis not present

## 2019-12-03 DIAGNOSIS — R7401 Elevation of levels of liver transaminase levels: Secondary | ICD-10-CM | POA: Diagnosis not present

## 2019-12-03 DIAGNOSIS — Z7982 Long term (current) use of aspirin: Secondary | ICD-10-CM | POA: Diagnosis not present

## 2019-12-03 DIAGNOSIS — D72829 Elevated white blood cell count, unspecified: Secondary | ICD-10-CM | POA: Diagnosis not present

## 2019-12-03 DIAGNOSIS — Z20822 Contact with and (suspected) exposure to covid-19: Secondary | ICD-10-CM | POA: Diagnosis present

## 2019-12-03 DIAGNOSIS — E663 Overweight: Secondary | ICD-10-CM | POA: Diagnosis not present

## 2019-12-03 DIAGNOSIS — Z79899 Other long term (current) drug therapy: Secondary | ICD-10-CM | POA: Diagnosis not present

## 2019-12-03 DIAGNOSIS — F410 Panic disorder [episodic paroxysmal anxiety] without agoraphobia: Secondary | ICD-10-CM | POA: Diagnosis present

## 2019-12-03 DIAGNOSIS — F411 Generalized anxiety disorder: Secondary | ICD-10-CM | POA: Diagnosis not present

## 2019-12-03 DIAGNOSIS — M47812 Spondylosis without myelopathy or radiculopathy, cervical region: Secondary | ICD-10-CM | POA: Diagnosis not present

## 2019-12-03 DIAGNOSIS — R531 Weakness: Secondary | ICD-10-CM | POA: Diagnosis not present

## 2019-12-03 DIAGNOSIS — T380X5A Adverse effect of glucocorticoids and synthetic analogues, initial encounter: Secondary | ICD-10-CM | POA: Diagnosis present

## 2019-12-03 DIAGNOSIS — G8929 Other chronic pain: Secondary | ICD-10-CM | POA: Diagnosis present

## 2019-12-03 DIAGNOSIS — G0491 Myelitis, unspecified: Secondary | ICD-10-CM | POA: Diagnosis not present

## 2019-12-03 LAB — CBC
HCT: 42.7 % (ref 39.0–52.0)
Hemoglobin: 14.4 g/dL (ref 13.0–17.0)
MCH: 30.9 pg (ref 26.0–34.0)
MCHC: 33.7 g/dL (ref 30.0–36.0)
MCV: 91.6 fL (ref 80.0–100.0)
Platelets: 254 10*3/uL (ref 150–400)
RBC: 4.66 MIL/uL (ref 4.22–5.81)
RDW: 12.4 % (ref 11.5–15.5)
WBC: 13.2 10*3/uL — ABNORMAL HIGH (ref 4.0–10.5)
nRBC: 0 % (ref 0.0–0.2)

## 2019-12-03 LAB — BASIC METABOLIC PANEL
Anion gap: 12 (ref 5–15)
BUN: 13 mg/dL (ref 6–20)
CO2: 24 mmol/L (ref 22–32)
Calcium: 9.7 mg/dL (ref 8.9–10.3)
Chloride: 104 mmol/L (ref 98–111)
Creatinine, Ser: 1.16 mg/dL (ref 0.61–1.24)
GFR, Estimated: >60 mL/min (ref >60)
Glucose, Bld: 262 mg/dL — ABNORMAL HIGH (ref 70–99)
Potassium: 4.3 mmol/L (ref 3.5–5.1)
Sodium: 140 mmol/L (ref 135–145)

## 2019-12-03 MED ORDER — GADOBUTROL 1 MMOL/ML IV SOLN
10.0000 mL | Freq: Once | INTRAVENOUS | Status: AC | PRN
  Administered 2019-12-03: 20:00:00 10 mL via INTRAVENOUS

## 2019-12-03 MED ORDER — LORAZEPAM 2 MG/ML IJ SOLN
1.0000 mg | Freq: Once | INTRAMUSCULAR | Status: AC
  Administered 2019-12-03: 19:00:00 1 mg via INTRAVENOUS
  Filled 2019-12-03: qty 1

## 2019-12-03 NOTE — ED Notes (Signed)
Patient transported to [facility]

## 2019-12-03 NOTE — ED Provider Notes (Signed)
[facility] EMERGENCY DEPARTMENT Provider Note   CSN: [id number] Arrival date & time: 12/03/19  1545     History Chief Complaint  Patient presents with  . Hand Problem    Ivan Jones is a 44 y.o. male.  Patient presents to ED for bilateral hand weakness.  He reports that he was recently admitted and discharged from [facility] for chest pain, had a stress test and was cleared, told it was an anxiety attack.  He and his wife report several stressors.  He reports today, he received a phone call from his mother who is ill, and within minutes he noticed that he was having difficulty with his grip.  He kept dropping things, was unable to button or unbutton his shirt.  He reports his wife had to help him put his shoes on.  The history is provided by the patient.  Neurologic Problem This is a new problem. The current episode started 1 to 2 hours ago. The problem occurs constantly. The problem has not changed since onset.Pertinent negatives include no chest pain, no abdominal pain and no shortness of breath. Nothing aggravates the symptoms. Nothing relieves the symptoms. He has tried nothing for the symptoms.       Past Medical History:  Diagnosis Date  . Arthritis    knees and shoulder  . Complication of anesthesia    wakes up to fast   . Diabetes mellitus without complication (HCC)    type 2  . History of kidney stones   . Hypertension     Patient Active Problem List   Diagnosis Date Noted  . Post-traumatic osteoarthritis of right knee 01/15/2017  . Right knee DJD 01/15/2017    Past Surgical History:  Procedure Laterality Date  . COLON SURGERY     diverticulitis 15 inches rescected  . KNEE ARTHROSCOPY     3 times  . TOTAL KNEE ARTHROPLASTY Right 01/15/2017   Procedure: RIGHT TOTAL KNEE ARTHROPLASTY;  Surgeon: Jene Every, MD;  Location: [facility];  Service: Orthopedics;  Laterality: Right;  2 hrs       No family history on file.  Social  History   Tobacco Use  . Smoking status: Never Smoker  . Smokeless tobacco: Never Used  Vaping Use  . Vaping Use: Never used  Substance Use Topics  . Alcohol use: Yes    Comment: occasional  . Drug use: No    Home Medications Prior to Admission medications   Medication Sig Start Date End Date Taking? Authorizing Provider  aspirin EC 325 MG tablet Take 1 tablet (325 mg total) by mouth 2 (two) times daily. 01/15/17   Jene Every, MD  docusate sodium (COLACE) 100 MG capsule Take 1 capsule (100 mg total) by mouth 2 (two) times daily as needed for mild constipation. 01/15/17   Jene Every, MD  HYDROmorphone (DILAUDID) 2 MG tablet Take 1-2 tablets (2-4 mg total) by mouth every 3 (three) hours as needed for severe pain. 01/16/17   Dorothy Spark, PA-C  JANUMET XR 50-1000 MG TB24 Take 1 tablet by mouth 2 (two) times daily. 12/30/16   [provider]  losartan (COZAAR) 100 MG tablet Take 100 mg by mouth daily. 11/06/16   [provider]  methocarbamol (ROBAXIN) 500 MG tablet Take 1 tablet (500 mg total) by mouth every 6 (six) hours as needed for muscle spasms. 01/16/17   Dorothy Spark, PA-C  polyethylene glycol (MIRALAX / GLYCOLAX) packet Take 17 g by mouth daily.  01/15/17   Jene EveryBeane, Jeffrey, MD    Allergies    Patient has no known allergies.  Review of Systems   Review of Systems  Constitutional: Negative for chills and fever.  HENT: Negative for ear pain and sore throat.   Eyes: Negative for pain and visual disturbance.  Respiratory: Negative for cough and shortness of breath.   Cardiovascular: Negative for chest pain and palpitations.  Gastrointestinal: Negative for abdominal pain and vomiting.  Genitourinary: Negative for dysuria and hematuria.  Musculoskeletal: Negative for arthralgias and back pain.  Skin: Negative for color change and rash.  Neurological: Positive for weakness. Negative for seizures and syncope.  All other systems reviewed and are  negative.   Physical Exam Updated Vital Signs BP 136/85   Pulse 92   Temp 99.1 F (37.3 C) (Oral)   Resp 17   Wt 114.3 kg   SpO2 95%   BMI 37.21 kg/m   Physical Exam Vitals and nursing note reviewed.  Constitutional:      Appearance: He is well-developed. He is obese. He is not ill-appearing, toxic-appearing or diaphoretic.  HENT:     Head: Normocephalic and atraumatic.  Eyes:     Conjunctiva/sclera: Conjunctivae normal.     Pupils: Pupils are equal, round, and reactive to light.  Cardiovascular:     Rate and Rhythm: Normal rate and regular rhythm.     Heart sounds: No murmur heard.  No gallop.   Pulmonary:     Effort: Pulmonary effort is normal. No respiratory distress.     Breath sounds: Normal breath sounds.  Abdominal:     Palpations: Abdomen is soft.     Tenderness: There is no abdominal tenderness.  Musculoskeletal:     Cervical back: Neck supple.  Skin:    General: Skin is warm and dry.  Neurological:     Mental Status: He is alert.     Motor: Weakness present.     Comments: Mental status: alert and oriented to person, place, time, situation. Speech: Speech is clear and language is not aphasic Fund of knowledge: Intact  Cranial Nerves:  II: Intact to confrontation bilaterally III, IV, VI: EOMI, no nystagmus V: face sensation intact, good masseter strength VII: no facial droop or weakness VIII: gross hearing intact bilaterally IX/XI: palate elevates symmetrically XII: tongue protrudes symmetrically, no deviation  Strength: 5/5 and symmetric in BUE and BLE with the exception of 4/5 in bilateral grip strength. No pronation or drift. Tone: normal tone, no tremors Coordination: Intact finger to nose and heel to shin.  Unable to do rapid alternating movements in hands. Sensation: intact to light touch in all extremities.  Romberg negative.  Gait: Routine gait stable without assistance      ED Results / Procedures / Treatments   Labs (all labs ordered  are listed, but only abnormal results are displayed) Labs Reviewed  BASIC METABOLIC PANEL - Abnormal; Notable for the following components:      Result Value   Glucose, Bld 262 (*)    All other components within normal limits  CBC - Abnormal; Notable for the following components:   WBC 13.2 (*)    All other components within normal limits    EKG EKG Interpretation  Date/Time:  Saturday December 03 2019 18:18:18 EDT Ventricular Rate:  96 PR Interval:    QRS Duration: 106 QT Interval:  342 QTC Calculation: 433 R Axis:   67 Text Interpretation: Sinus rhythm Confirmed by Tilden Fossaees, Elizabeth [phone]) on 12/03/2019 6:49:52 PM  Radiology MR Cervical Spine W or Wo Contrast  Result Date: 12/03/2019 CLINICAL DATA:  Neck pain, acute, infection suspected. EXAM: MRI CERVICAL SPINE WITHOUT AND WITH CONTRAST TECHNIQUE: Multiplanar and multiecho pulse sequences of the cervical spine, to include the craniocervical junction and cervicothoracic junction, were obtained without and with intravenous contrast. CONTRAST:  34mL GADAVIST GADOBUTROL 1 MMOL/ML IV SOLN COMPARISON:  None. FINDINGS: Please note that image quality is degraded by motion artifact. Alignment: Reversal of lordosis. Minimal grade 1 C4-5 anterolisthesis. Vertebrae: No focal osseous lesion. No bone marrow edema or abnormal enhancement. Cord: Multifocal T2 hyperintense cord lesions at the C3, C5 through T1 levels. Normal cord caliber. Smaller T2 hyperintense cord lesions are likely present at the C3-4 level as well. Mild hyperintense signal within the cord at the C5-6 level on sagittal postcontrast imaging may reflect artifact versus minimal enhancement. Posterior Fossa, vertebral arteries: Negative. Disc levels: Multilevel desiccation, osteophytosis and disc space loss. C2-3: Uncovertebral and facet hypertrophy. Patent spinal canal and right neural foramen. Mild left neural foraminal narrowing. C3-4: Uncovertebral and facet hypertrophy with shallow  central protrusion. Patent spinal canal. Mild bilateral neural foraminal narrowing. C4-5: Uncovertebral and facet hypertrophy with shallow central protrusion. Mild bilateral neural foraminal narrowing. Patent spinal canal. C5-6: Disc osteophyte complex with superimposed right subarticular and left paracentral protrusions abutting the ventral cord. Uncovertebral and facet hypertrophy. Partial effacement of the ventral CSF containing spaces. Patent spinal canal. Mild to moderate bilateral neural foraminal narrowing. C6-7: Disc osteophyte complex with superimposed right foraminal/subarticular protrusion. Uncovertebral and facet hypertrophy. There may be a small inferiorly migrated left paracentral extrusion. Mild spinal canal and moderate bilateral neural foraminal narrowing. C7-T1: Uncovertebral and facet hypertrophy. Mild right greater than left neural foraminal narrowing. Patent spinal canal. Paraspinal tissues: Negative. IMPRESSION: No evidence of discitis osteomyelitis. Multifocal cervical cord lesions with possible mild enhancement at the C5-6 level. Differential includes transverse myelitis versus demyelination. Multilevel spondylosis most prominent at the C5-7 levels, further detailed above. Electronically Signed   By: Stana Bunting M.D.   On: 12/03/2019 20:46    Procedures Procedures (including critical care time)  Medications Ordered in ED Medications  LORazepam (ATIVAN) injection 1 mg (1 mg Intravenous Given 12/03/19 1909)  gadobutrol (GADAVIST) 1 MMOL/ML injection 10 mL (10 mLs Intravenous Contrast Given 12/03/19 1949)    ED Course  I have reviewed the triage vital signs and the nursing notes.  Pertinent labs & imaging results that were available during my care of the patient were reviewed by me and considered in my medical decision making (see chart for details).    MDM Rules/Calculators/A&P                          The patient is a 44yo male, PMH obesity and HTN who presents to  the ED for bilateral hand weakness.  On my initial evaluation, the patient is hemodynamically stable, afebrile, nontoxic-appearing. Physical exam remarkable for decreased strength in bilateral hands but no weakness elsewhere.  Labs obtained prior to my evaluation remarkable for leukocytosis.  Since patient reports neck pain for the last couple days, differentials considered include epidural abscess, discitis or osteomyelitis, spinal hematoma, cord compression such as from herniated disc.  Provided IV Ativan for MRI, and obtained MRI C-spine for further evaluation.  Per radiology interpretation of the MRI C-spine, there is some enhancement at C5 and 6 with some other abnormalities concerning for possible transverse myelitis.  Consulted neurology, who immediately requested MRI brain for  further evaluation, further assessment of possible demyelinating diseases.  Patient and wife at bedside informed of plan for MRI, and are in agreement.  Transfer of care at 2300 to oncoming PA.  Plan of care communicated, which is patient is pending MRI and disposition per neurology recommendations.  Patient stable condition at time of transfer.  The care of this patient was overseen by Dr. Madilyn Hook, who agreed with evaluation and plan of care.   Final Clinical Impression(s) / ED Diagnoses Final diagnoses:  Weakness of both hands    Rx / DC Orders ED Discharge Orders    None       Loletha Carrow, MD 12/04/19 Salley Hews    Tilden Fossa, MD 12/04/19 [phone]

## 2019-12-03 NOTE — ED Triage Notes (Signed)
Pt reports he was seen at [facility] last night and was diagnosed with anxiety. Reports today at noon, he lost the use of his hands. Pt able to remove his mask for temperature check, grip strength weakened bilaterally.

## 2019-12-04 ENCOUNTER — Inpatient Hospital Stay (HOSPITAL_COMMUNITY): Payer: BC Managed Care – PPO

## 2019-12-04 ENCOUNTER — Emergency Department (HOSPITAL_COMMUNITY): Payer: BC Managed Care – PPO

## 2019-12-04 DIAGNOSIS — E1165 Type 2 diabetes mellitus with hyperglycemia: Secondary | ICD-10-CM | POA: Diagnosis present

## 2019-12-04 DIAGNOSIS — I10 Essential (primary) hypertension: Secondary | ICD-10-CM

## 2019-12-04 DIAGNOSIS — Z20822 Contact with and (suspected) exposure to covid-19: Secondary | ICD-10-CM | POA: Diagnosis present

## 2019-12-04 DIAGNOSIS — E119 Type 2 diabetes mellitus without complications: Secondary | ICD-10-CM | POA: Diagnosis not present

## 2019-12-04 DIAGNOSIS — Z96651 Presence of right artificial knee joint: Secondary | ICD-10-CM | POA: Diagnosis present

## 2019-12-04 DIAGNOSIS — Y929 Unspecified place or not applicable: Secondary | ICD-10-CM | POA: Diagnosis not present

## 2019-12-04 DIAGNOSIS — F411 Generalized anxiety disorder: Secondary | ICD-10-CM | POA: Diagnosis present

## 2019-12-04 DIAGNOSIS — Z79899 Other long term (current) drug therapy: Secondary | ICD-10-CM | POA: Diagnosis not present

## 2019-12-04 DIAGNOSIS — R7401 Elevation of levels of liver transaminase levels: Secondary | ICD-10-CM | POA: Diagnosis present

## 2019-12-04 DIAGNOSIS — Z8249 Family history of ischemic heart disease and other diseases of the circulatory system: Secondary | ICD-10-CM | POA: Diagnosis not present

## 2019-12-04 DIAGNOSIS — G373 Acute transverse myelitis in demyelinating disease of central nervous system: Secondary | ICD-10-CM | POA: Diagnosis present

## 2019-12-04 DIAGNOSIS — Z6835 Body mass index (BMI) 35.0-35.9, adult: Secondary | ICD-10-CM | POA: Diagnosis not present

## 2019-12-04 DIAGNOSIS — R202 Paresthesia of skin: Secondary | ICD-10-CM

## 2019-12-04 DIAGNOSIS — F410 Panic disorder [episodic paroxysmal anxiety] without agoraphobia: Secondary | ICD-10-CM | POA: Diagnosis present

## 2019-12-04 DIAGNOSIS — G8929 Other chronic pain: Secondary | ICD-10-CM | POA: Diagnosis present

## 2019-12-04 DIAGNOSIS — T380X5A Adverse effect of glucocorticoids and synthetic analogues, initial encounter: Secondary | ICD-10-CM | POA: Diagnosis present

## 2019-12-04 DIAGNOSIS — E663 Overweight: Secondary | ICD-10-CM | POA: Diagnosis present

## 2019-12-04 DIAGNOSIS — Z7982 Long term (current) use of aspirin: Secondary | ICD-10-CM | POA: Diagnosis not present

## 2019-12-04 DIAGNOSIS — D72829 Elevated white blood cell count, unspecified: Secondary | ICD-10-CM | POA: Diagnosis present

## 2019-12-04 LAB — GLUCOSE, CAPILLARY
Glucose-Capillary: 310 mg/dL — ABNORMAL HIGH (ref 70–99)
Glucose-Capillary: 307 mg/dL — ABNORMAL HIGH (ref 70–99)

## 2019-12-04 LAB — CBG MONITORING, ED
Glucose-Capillary: 220 mg/dL — ABNORMAL HIGH (ref 70–99)
Glucose-Capillary: 283 mg/dL — ABNORMAL HIGH (ref 70–99)

## 2019-12-04 LAB — RESPIRATORY PANEL BY RT PCR (FLU A&B, COVID)
Influenza A by PCR: NEGATIVE
Influenza B by PCR: NEGATIVE
SARS Coronavirus 2 by RT PCR: NEGATIVE

## 2019-12-04 LAB — HIV ANTIBODY (ROUTINE TESTING W REFLEX): HIV Screen 4th Generation wRfx: NONREACTIVE

## 2019-12-04 MED ORDER — ACETAMINOPHEN 325 MG PO TABS
650.0000 mg | ORAL_TABLET | Freq: Four times a day (QID) | ORAL | Status: DC | PRN
  Administered 2019-12-07: 650 mg via ORAL
  Filled 2019-12-04: qty 2

## 2019-12-04 MED ORDER — LOSARTAN POTASSIUM 50 MG PO TABS
100.0000 mg | ORAL_TABLET | Freq: Every day | ORAL | Status: DC
  Administered 2019-12-04 – 2019-12-07 (×4): 100 mg via ORAL
  Filled 2019-12-04 (×4): qty 2

## 2019-12-04 MED ORDER — SODIUM CHLORIDE 0.9 % IV SOLN
1000.0000 mg | Freq: Once | INTRAVENOUS | Status: AC
  Administered 2019-12-04: 1000 mg via INTRAVENOUS
  Filled 2019-12-04: qty 8

## 2019-12-04 MED ORDER — LORAZEPAM 1 MG PO TABS
1.0000 mg | ORAL_TABLET | Freq: Once | ORAL | Status: AC | PRN
  Administered 2019-12-04: 1 mg via ORAL
  Filled 2019-12-04: qty 1

## 2019-12-04 MED ORDER — HYDROMORPHONE HCL 2 MG PO TABS: 1.0000 mg | ORAL_TABLET | Freq: Four times a day (QID) | ORAL | Status: DC | PRN

## 2019-12-04 MED ORDER — GADOBUTROL 1 MMOL/ML IV SOLN
10.0000 mL | Freq: Once | INTRAVENOUS | Status: AC | PRN
  Administered 2019-12-04: 10 mL via INTRAVENOUS

## 2019-12-04 MED ORDER — LIDOCAINE HCL (PF) 1 % IJ SOLN
INTRAMUSCULAR | Status: AC
  Filled 2019-12-04: qty 5

## 2019-12-04 MED ORDER — ONDANSETRON HCL 4 MG PO TABS: 4.0000 mg | ORAL_TABLET | Freq: Four times a day (QID) | ORAL | Status: DC | PRN

## 2019-12-04 MED ORDER — LIDOCAINE HCL (PF) 1 % IJ SOLN: 5.0000 mL | Freq: Once | INTRAMUSCULAR | Status: DC

## 2019-12-04 MED ORDER — INSULIN ASPART 100 UNIT/ML ~~LOC~~ SOLN
0.0000 [IU] | Freq: Every day | SUBCUTANEOUS | Status: DC
  Administered 2019-12-04: 5 [IU] via SUBCUTANEOUS
  Administered 2019-12-05: 4 [IU] via SUBCUTANEOUS
  Administered 2019-12-06 – 2019-12-07 (×2): 5 [IU] via SUBCUTANEOUS

## 2019-12-04 MED ORDER — LIDOCAINE HCL (PF) 1 % IJ SOLN
INTRAMUSCULAR | Status: AC
  Administered 2019-12-04: 20 mL
  Filled 2019-12-04: qty 5

## 2019-12-04 MED ORDER — ASPIRIN EC 325 MG PO TBEC
325.0000 mg | DELAYED_RELEASE_TABLET | Freq: Two times a day (BID) | ORAL | Status: DC
  Administered 2019-12-04 – 2019-12-09 (×11): 325 mg via ORAL
  Filled 2019-12-04 (×11): qty 1

## 2019-12-04 MED ORDER — INSULIN ASPART 100 UNIT/ML ~~LOC~~ SOLN
0.0000 [IU] | Freq: Three times a day (TID) | SUBCUTANEOUS | Status: DC
  Administered 2019-12-04: 7 [IU] via SUBCUTANEOUS
  Administered 2019-12-04: 11 [IU] via SUBCUTANEOUS
  Administered 2019-12-04: 15 [IU] via SUBCUTANEOUS
  Administered 2019-12-05 (×2): 20 [IU] via SUBCUTANEOUS
  Administered 2019-12-05: 11 [IU] via SUBCUTANEOUS
  Administered 2019-12-06: 15 [IU] via SUBCUTANEOUS
  Administered 2019-12-06 (×2): 20 [IU] via SUBCUTANEOUS
  Administered 2019-12-07: 11 [IU] via SUBCUTANEOUS

## 2019-12-04 MED ORDER — LORAZEPAM 2 MG/ML IJ SOLN
1.0000 mg | Freq: Once | INTRAMUSCULAR | Status: AC
  Administered 2019-12-04: 1 mg via INTRAVENOUS
  Filled 2019-12-04: qty 1

## 2019-12-04 MED ORDER — MELOXICAM 7.5 MG PO TABS
15.0000 mg | ORAL_TABLET | Freq: Every day | ORAL | Status: DC
  Administered 2019-12-04 – 2019-12-07 (×4): 15 mg via ORAL
  Filled 2019-12-04 (×4): qty 2

## 2019-12-04 MED ORDER — HEPARIN SODIUM (PORCINE) 5000 UNIT/ML IJ SOLN
5000.0000 [IU] | Freq: Three times a day (TID) | INTRAMUSCULAR | Status: DC
  Administered 2019-12-04 – 2019-12-09 (×13): 5000 [IU] via SUBCUTANEOUS
  Filled 2019-12-04 (×13): qty 1

## 2019-12-04 MED ORDER — ACETAMINOPHEN 650 MG RE SUPP: 650.0000 mg | Freq: Four times a day (QID) | RECTAL | Status: DC | PRN

## 2019-12-04 MED ORDER — ONDANSETRON HCL 4 MG/2ML IJ SOLN: 4.0000 mg | Freq: Four times a day (QID) | INTRAMUSCULAR | Status: DC | PRN

## 2019-12-04 MED ORDER — SODIUM CHLORIDE 0.9 % IV SOLN
1000.0000 mg | INTRAVENOUS | Status: AC
  Administered 2019-12-05 – 2019-12-08 (×4): 1000 mg via INTRAVENOUS
  Filled 2019-12-04 (×4): qty 8

## 2019-12-04 NOTE — ED Notes (Signed)
Pt OOB to use bathroom. Spouse with pt to assist

## 2019-12-04 NOTE — H&P (Signed)
TRH H&P    Patient Demographics:    Ivan Jones, is a 44 y.o. male  MRN: [id number]  DOB - [birth year]  Admit Date - 12/03/2019  Referring MD/NP/PA: Ivar Drape  Outpatient Primary MD for the patient is Marylen Ponto, MD  Patient coming from: Home  Chief complaint-bilateral hand weakness   HPI:    Ivan Jones  is a 44 y.o. male, with history of diabetes mellitus type 2, hypertension presents the ED with a chief complaint of bilateral hand numbness.  Patient reports that the weakness and numbness started in his hands on December 03, 2019 at noon.  He reports that he had lost grip strength to the point where he could not hold and drink or open a car door.  He reports that he has no feeling in his hands at all.  He has no pain in his hands.  It was sudden onset.  He has no associated symptoms.  It has been constant since it started.  Patient reports that he was in his normal state of health until this started.  Patient denies any fever, chills, fatigue, weakness, palpitations, nausea vomiting, abdominal pain, diarrhea on review of systems.  On further questioning he does admit that he was recently seen at [facility] for chest pain.  He was diagnosed with a panic attack.  In the ED  Temperature 99.1, heart rate 81, respiratory rate 17, blood pressure 142/90, satting at 95% Leukocytosis 13.2 CHEM panel unremarkable aside from minimally elevated creatinine that does not meet AKI criteria.  Baseline creatinine is approximately 0.9, today is 1.16. neurology was consulted -they assessed that patient has acute rapidly progressing paraparesis and paresthesia which clearly defined sensory level at C3 without autonomic involvement.  Recommended emergent MRI.  MRI of brain and thoracic spine were normal.  MRI of cervical spine showed no discitis or osteomyelitis but did show multifocal cord lesions with possible mild enhancement at  C5-C6 with transverse myelitis and demyelinating disease on the differential.  Neurology believes this is more likely to be transverse myelitis.  They recommended lumbar puncture which was not successful in the ED so fluoroscopy lumbar puncture ordered for a.m.  They would like cytology including cell count and differential, glucose, protein, culture oligoclonal bands, AQ P4 antibody, enterovirus PCR, cytomegalovirus PCR, Epstein-Barr virus PCR, herpes virus PCR, varicella zoster virus PCR and IgG index.  Would also like antimyelin oligodendrocyte glycoprotein antibody, arbovirus panel with including West Nile virus antibodies.  They recommended Solu-Medrol IV 1000 mg daily for 5 days.  And neurology will continue to follow.  EKG showed sinus rhythm 96 QTC 433 Solu-Medrol was started in ED, lorazepam also given    Review of systems:    In addition to the HPI above,  No Fever-chills, No Headache, No changes with Vision or hearing, No problems swallowing food or Liquids, No Chest pain today although was seen for chest pain yesterday, Cough or Shortness of Breath, No Abdominal pain, No Nausea or Vomiting, bowel movements are regular, No Blood in stool or Urine, No dysuria,  No new skin rashes or bruises, No new joints pains-aches, Weakness and tingling as above No recent weight gain or loss, No polyuria, polydypsia or polyphagia, No significant Mental Stressors.  All other systems reviewed and are negative.    Past History of the following :    Past Medical History:  Diagnosis Date  . Arthritis    knees and shoulder  . Complication of anesthesia    wakes up to fast   . Diabetes mellitus without complication (HCC)    type 2  . History of kidney stones   . Hypertension       Past Surgical History:  Procedure Laterality Date  . COLON SURGERY     diverticulitis 15 inches rescected  . KNEE ARTHROSCOPY     3 times  . TOTAL KNEE ARTHROPLASTY Right 01/15/2017   Procedure: RIGHT  TOTAL KNEE ARTHROPLASTY;  Surgeon: Jene Every, MD;  Location: [facility];  Service: Orthopedics;  Laterality: Right;  2 hrs      Social History:      Social History   Tobacco Use  . Smoking status: Never Smoker  . Smokeless tobacco: Never Used  Substance Use Topics  . Alcohol use: Yes    Comment: occasional       Family History :    No family history on file. Family history hypertension   Home Medications:   Prior to Admission medications   Medication Sig Start Date End Date Taking? Authorizing Provider  aspirin EC 325 MG tablet Take 1 tablet (325 mg total) by mouth 2 (two) times daily. 01/15/17   Jene Every, MD  docusate sodium (COLACE) 100 MG capsule Take 1 capsule (100 mg total) by mouth 2 (two) times daily as needed for mild constipation. 01/15/17   Jene Every, MD  HYDROmorphone (DILAUDID) 2 MG tablet Take 1-2 tablets (2-4 mg total) by mouth every 3 (three) hours as needed for severe pain. 01/16/17   Dorothy Spark, PA-C  JANUMET XR 50-1000 MG TB24 Take 1 tablet by mouth 2 (two) times daily. 12/30/16   [provider]  losartan (COZAAR) 100 MG tablet Take 100 mg by mouth daily. 11/06/16   [provider]  methocarbamol (ROBAXIN) 500 MG tablet Take 1 tablet (500 mg total) by mouth every 6 (six) hours as needed for muscle spasms. 01/16/17   Dorothy Spark, PA-C  polyethylene glycol (MIRALAX / GLYCOLAX) packet Take 17 g by mouth daily. 01/15/17   Jene Every, MD     Allergies:    No Known Allergies   Physical Exam:   Vitals  Blood pressure (!) 142/90, pulse 81, temperature 99.1 F (37.3 C), temperature source Oral, resp. rate 17, weight 114.3 kg, SpO2 95 %.  1.  General: Laying left lateral decubitus in bed in no acute distress  2. Psychiatric: Mood and behavior normal for situation  3. Neurologic: Cranial nerves II through XII are grossly intact, moves all 4 extremities voluntarily, normal strength in his lower extremities  bilaterally, equal sensation in his lower extremities bilaterally, markedly decreased grip strength in hands bilaterally, decreased strength with pronation as well  4. HEENMT:  Head is atraumatic, normocephalic, pupils are reactive to light, neck is supple, trachea is midline, mucous membranes are moist  5. Respiratory : Lungs clear to auscultation bilaterally, no wheeze, no rhonchi, no rales, no clubbing, no cyanosis  6. Cardiovascular : Heart rate is normal, rhythm is regular, no murmurs rubs or gallops  7. Gastrointestinal:  Abdomen is obese, nondistended,  nontender to palpation  8. Skin:  No acute lesions on limited skin exam  9.Musculoskeletal:  No calf tenderness, acute deformity, peripheral edema    Data Review:    CBC Recent Labs  Lab 12/03/19 1622  WBC 13.2*  HGB 14.4  HCT 42.7  PLT 254  MCV 91.6  MCH 30.9  MCHC 33.7  RDW 12.4   ------------------------------------------------------------------------------------------------------------------  Results for orders placed or performed during the hospital encounter of 12/03/19 (from the past 48 hour(s))  Basic metabolic panel     Status: Abnormal   Collection Time: 12/03/19  4:22 PM  Result Value Ref Range   Sodium 140 135 - 145 mmol/L   Potassium 4.3 3.5 - 5.1 mmol/L   Chloride 104 98 - 111 mmol/L   CO2 24 22 - 32 mmol/L   Glucose, Bld 262 (H) 70 - 99 mg/dL    Comment: Glucose reference range applies only to samples taken after fasting for at least 8 hours.   BUN 13 6 - 20 mg/dL   Creatinine, Ser 1.611.16 0.61 - 1.24 mg/dL   Calcium 9.7 8.9 - 09.610.3 mg/dL   GFR, Estimated >04>60 >54>60 mL/min    Comment: (NOTE) Calculated using the CKD-EPI Creatinine Equation (2021)    Anion gap 12 5 - 15    Comment: Performed at [facility], [address] [place], [place] [address]  CBC     Status: Abnormal   Collection Time: 12/03/19  4:22 PM  Result Value Ref Range   WBC 13.2 (H) 4.0 - 10.5 K/uL   RBC 4.66 4.22 - 5.81  MIL/uL   Hemoglobin 14.4 13.0 - 17.0 g/dL   HCT 91.442.7 39 - 52 %   MCV 91.6 80.0 - 100.0 fL   MCH 30.9 26.0 - 34.0 pg   MCHC 33.7 30.0 - 36.0 g/dL   RDW 78.212.4 95.611.5 - 21.315.5 %   Platelets 254 150 - 400 K/uL   nRBC 0.0 0.0 - 0.2 %    Comment: Performed at [facility], [address] [place], [place] [address]    Chemistries  Recent Labs  Lab 12/03/19 1622  NA 140  K 4.3  CL 104  CO2 24  GLUCOSE 262*  BUN 13  CREATININE 1.16  CALCIUM 9.7   ------------------------------------------------------------------------------------------------------------------  ------------------------------------------------------------------------------------------------------------------ GFR: CrCl cannot be calculated (Unknown ideal weight.). Liver Function Tests: No results for input(s): AST, ALT, ALKPHOS, BILITOT, PROT, ALBUMIN in the last 168 hours. No results for input(s): LIPASE, AMYLASE in the last 168 hours. No results for input(s): AMMONIA in the last 168 hours. Coagulation Profile: No results for input(s): INR, PROTIME in the last 168 hours. Cardiac Enzymes: No results for input(s): CKTOTAL, CKMB, CKMBINDEX, TROPONINI in the last 168 hours. BNP (last 3 results) No results for input(s): PROBNP in the last 8760 hours. HbA1C: No results for input(s): HGBA1C in the last 72 hours. CBG: No results for input(s): GLUCAP in the last 168 hours. Lipid Profile: No results for input(s): CHOL, HDL, LDLCALC, TRIG, CHOLHDL, LDLDIRECT in the last 72 hours. Thyroid Function Tests: No results for input(s): TSH, T4TOTAL, FREET4, T3FREE, THYROIDAB in the last 72 hours. Anemia Panel: No results for input(s): VITAMINB12, FOLATE, FERRITIN, TIBC, IRON, RETICCTPCT in the last 72 hours.  --------------------------------------------------------------------------------------------------------------- Urine analysis:    Component Value Date/Time   COLORURINE YELLOW 01/14/2017 0820   APPEARANCEUR CLEAR  01/14/2017 0820   LABSPEC 1.025 01/14/2017 0820   PHURINE 6.0 01/14/2017 0820   GLUCOSEU NEGATIVE 01/14/2017  0820   HGBUR NEGATIVE 01/14/2017 0820   BILIRUBINUR NEGATIVE 01/14/2017 0820   KETONESUR NEGATIVE 01/14/2017 0820   PROTEINUR NEGATIVE 01/14/2017 0820   NITRITE NEGATIVE 01/14/2017 0820   LEUKOCYTESUR NEGATIVE 01/14/2017 0820      Imaging Results:    MR Brain W and Wo Contrast  Result Date: 12/04/2019 CLINICAL DATA:  Decreased hand dexterity. Possible demyelinating disease. EXAM: MRI HEAD WITHOUT AND WITH CONTRAST TECHNIQUE: Multiplanar, multiecho pulse sequences of the brain and surrounding structures were obtained without and with intravenous contrast. CONTRAST:  10mL GADAVIST GADOBUTROL 1 MMOL/ML IV SOLN COMPARISON:  None. FINDINGS: Brain: No acute infarct, acute hemorrhage or extra-axial collection. Normal white matter signal. Normal volume of CSF spaces. No chronic microhemorrhage. Normal midline structures. There is no abnormal contrast enhancement. Vascular: Normal flow voids. Skull and upper cervical spine: Normal marrow signal. Sinuses/Orbits: Negative. Other: None. IMPRESSION: Normal brain MRI. No evidence of demyelinating disease. Electronically Signed   By: Deatra Robinson M.D.   On: 12/04/2019 04:16   MR Cervical Spine W or Wo Contrast  Result Date: 12/03/2019 CLINICAL DATA:  Neck pain, acute, infection suspected. EXAM: MRI CERVICAL SPINE WITHOUT AND WITH CONTRAST TECHNIQUE: Multiplanar and multiecho pulse sequences of the cervical spine, to include the craniocervical junction and cervicothoracic junction, were obtained without and with intravenous contrast. CONTRAST:  10mL GADAVIST GADOBUTROL 1 MMOL/ML IV SOLN COMPARISON:  None. FINDINGS: Please note that image quality is degraded by motion artifact. Alignment: Reversal of lordosis. Minimal grade 1 C4-5 anterolisthesis. Vertebrae: No focal osseous lesion. No bone marrow edema or abnormal enhancement. Cord: Multifocal T2  hyperintense cord lesions at the C3, C5 through T1 levels. Normal cord caliber. Smaller T2 hyperintense cord lesions are likely present at the C3-4 level as well. Mild hyperintense signal within the cord at the C5-6 level on sagittal postcontrast imaging may reflect artifact versus minimal enhancement. Posterior Fossa, vertebral arteries: Negative. Disc levels: Multilevel desiccation, osteophytosis and disc space loss. C2-3: Uncovertebral and facet hypertrophy. Patent spinal canal and right neural foramen. Mild left neural foraminal narrowing. C3-4: Uncovertebral and facet hypertrophy with shallow central protrusion. Patent spinal canal. Mild bilateral neural foraminal narrowing. C4-5: Uncovertebral and facet hypertrophy with shallow central protrusion. Mild bilateral neural foraminal narrowing. Patent spinal canal. C5-6: Disc osteophyte complex with superimposed right subarticular and left paracentral protrusions abutting the ventral cord. Uncovertebral and facet hypertrophy. Partial effacement of the ventral CSF containing spaces. Patent spinal canal. Mild to moderate bilateral neural foraminal narrowing. C6-7: Disc osteophyte complex with superimposed right foraminal/subarticular protrusion. Uncovertebral and facet hypertrophy. There may be a small inferiorly migrated left paracentral extrusion. Mild spinal canal and moderate bilateral neural foraminal narrowing. C7-T1: Uncovertebral and facet hypertrophy. Mild right greater than left neural foraminal narrowing. Patent spinal canal. Paraspinal tissues: Negative. IMPRESSION: No evidence of discitis osteomyelitis. Multifocal cervical cord lesions with possible mild enhancement at the C5-6 level. Differential includes transverse myelitis versus demyelination. Multilevel spondylosis most prominent at the C5-7 levels, further detailed above. Electronically Signed   By: Stana Bunting M.D.   On: 12/03/2019 20:46   MR THORACIC SPINE W WO CONTRAST  Result Date:  12/04/2019 CLINICAL DATA:  Possible demyelination.  Decreased hand dexterity. EXAM: MRI THORACIC WITHOUT AND WITH CONTRAST TECHNIQUE: Multiplanar and multiecho pulse sequences of the thoracic spine were obtained without and with intravenous contrast. CONTRAST:  10mL GADAVIST GADOBUTROL 1 MMOL/ML IV SOLN COMPARISON:  None. FINDINGS: MRI THORACIC SPINE FINDINGS Alignment:  Physiologic. Vertebrae: No fracture, evidence of discitis, or bone lesion.  Cord: Hyperintense T2-weighted signal within the cervical spine extends to the T1 level. No abnormal contrast enhancement. Paraspinal and other soft tissues: Negative. Disc levels: No disc herniation or spinal canal stenosis. IMPRESSION: No evidence of demyelinating disease within the thoracic spine. Electronically Signed   By: Deatra Robinson M.D.   On: 12/04/2019 04:20    My personal review of EKG: Rhythm NSR, Rate 96 /min, QTc 433 ,no Acute ST changes   Assessment & Plan:    Active Problems:   Transverse myelitis (HCC)   1. Transverse myelitis 1. Neurology consulted and appreciate their recs 2. Lumbar puncture with CSF studies recommended by neurology ordered 3. Continue Solu-Medrol 1000 mg IV daily 4. We will continue to follow neuro recommendations 2. Hyperglycemia in the setting of diabetes mellitus type 2 1. On Janumet at home 2. Hyperglycemic at presentation with a glucose of 262 3. Holding Janumet, starting sliding scale, nightly coverage 4. Expecting continued hyperglycemia in the setting of high-dose steroid, so resistant sliding scale coverage was ordered 5. Hemoglobin A1c pending 6. Patient is currently n.p.o. until after lumbar puncture, at that time consider adding carb controlled heart healthy diet 7. Continue to monitor 3. Leukocytosis 1. White blood cell count 13.2 2. Likely secondary to #1 3. May increase with steroid 4. Continue to monitor 4. Hypertension 1. Continue losartan 5. Chronic pain 1. Continue home dose of  meloxicam, as needed opiate for pain 6.    DVT Prophylaxis-   Heparin- SCDs   AM Labs Ordered, also please review Full Orders  Family Communication: Admission, patients condition and plan of care including tests being ordered have been discussed with the patient and wife who indicate understanding and agree with the plan and Code Status.  Code Status: Full  Admission status: Inpatient :The appropriate admission status for this patient is INPATIENT. Inpatient status is judged to be reasonable and necessary in order to provide the required intensity of service to ensure the patient's safety. The patient's presenting symptoms, physical exam findings, and initial radiographic and laboratory data in the context of their chronic comorbidities is felt to place them at high risk for further clinical deterioration. Furthermore, it is not anticipated that the patient will be medically stable for discharge from the hospital within 2 midnights of admission. The following factors support the admission status of inpatient.     The patient's presenting symptoms include hand numbness weakness The worrisome physical exam findings include marked weakness in bilateral hands. The initial radiographic and laboratory data are worrisome because of enhancement of C5-C6 and MRI cervical spine. The chronic co-morbidities include hypertension, diabetes mellitus type 2, arthritis       * I certify that at the point of admission it is my clinical judgment that the patient will require inpatient hospital care spanning beyond 2 midnights from the point of admission due to high intensity of service, high risk for further deterioration and high frequency of surveillance required.*  Time spent in minutes : 64   Lionell Matuszak B Zierle-Ghosh DO

## 2019-12-04 NOTE — Progress Notes (Signed)
  PROGRESS NOTE    Ivan Jones  TIW:[id number] DOB: 12/11/1975 DOA: 12/03/2019 PCP: Marylen Ponto, MD    Brief Narrative:   Admitted 12/03/2019:  Presented to ED with  Chief Complaint  Patient presents with  . Hand Problem    Hospital course:  ER 12/03/19- admission 12/04/19: Presented w/ bilateral hand weakness, neurology evaluation and MRI C-spine (+)multifocal cord lesions with possible mild enhancement at C5-C6 with transverse myelitis and demyelinating disease on the differential. MRI brain and T-spine ok. Neurology believes likely to be transverse myelitis. LP unsuccessful in ED, ordered for fluoroscopy 12/04/19. Started Solumedrol 1000 mg daily IV x5 days.    Today 12/04/19:  I saw and examined patient in ED. Wife also present. All questions answered. NPO, awaiting LP under fluoroscopy.    Assessment & Plan:   Active Problems:   Transverse myelitis (HCC)   Type 2 diabetes mellitus without complication (HCC)   Essential hypertension   No new orders from H&P Will hopefully restart diet after LP completed AM labs already ordered  Pending labs:   LP - cytology including cell count and differential, glucose, protein, culture oligoclonal bands, AQ P4 antibody, enterovirus PCR, cytomegalovirus PCR, Epstein-Barr virus PCR, herpes virus PCR, varicella zoster virus PCR and IgG index.   Other - antimyelin oligodendrocyte glycoprotein antibody, arbovirus panel with including West Nile virus antibodies    LOS: 0 days    Time spent: 25 mins    Sunnie Nielsen, DO [facility]  If 7PM-7AM, please contact night-coverage [email or web address] 12/04/2019, 1:49 PM

## 2019-12-04 NOTE — ED Provider Notes (Signed)
  [facility] EMERGENCY DEPARTMENT Provider Note     Patient signed out to me with bilateral hand weakness.  Pending neurology consult.  Dr. Thomasena Edis, neurology, recommends admission, MRI brain, cervical, and thoracic spine w/wo.  Recommends LP and 1000mg  of solumedrol x5 days.  Case discussed with Dr. , who is appreciated for admitting.     Procedures .Lumbar Puncture  Date/Time: 12/04/2019 5:21 AM Performed by: 12/06/2019, PA-C Authorized by: Roxy Horseman, MD   Consent:    Consent obtained:  Verbal   Consent given by:  Patient   Risks discussed:  Bleeding, pain, repeat procedure, nerve damage, headache and infection   Alternatives discussed:  No treatment Pre-procedure details:    Procedure purpose:  Diagnostic   Preparation: Patient was prepped and draped in usual sterile fashion   Anesthesia (see MAR for exact dosages):    Anesthesia method:  Local infiltration   Local anesthetic:  Lidocaine 1% w/o epi Procedure details:    Lumbar space:  L4-L5 interspace   Patient position:  Sitting   Needle gauge:  18   Needle type:  Spinal needle - Quincke tip   Needle length (in):  3.5   Ultrasound guidance: no     Number of attempts:  2 Comments:     2 attempts were made.  The first attempt was by me, under Dr. Marily Memos supervision at the L4/5 vertebral interspace.  No CSF was obtained.  The second attempt was made by Dr. Danielle Rankin at the L3/4 vertebral interspace, no CSF was obtained, there was scant amount of blood from the spinal needle, but it did not appear to be mixed with CSF.  There were no complications, save for failure to obtain fluid. After the 2 attempts, the procedure was terminated.         Clayborne Dana, PA-C 12/04/19 12/06/19    1275, MD 12/04/19 [phone]

## 2019-12-04 NOTE — Procedures (Signed)
Attempted LP under fluoroscopic guidance using local anesthetic and sterile technique. LP attempted at L3-4 and L2-3 without fluid.  During the procedure, the fluoro table malfunctioned, and the procedure was terminated, as unable to reposition patient. Patient tolerated procedure well. Will reschedule the procedure for 12/05/19.

## 2019-12-04 NOTE — Consult Note (Addendum)
Neurology H&P  CC: acute upper extremity paraparesis and paresthesia  History is obtained from: patient and wife  HPI: Loden Laurent is a 44 y.o. male lives in a trailer awaiting construction of his house presented with acute upper extremity paraparesis and paresthesia. He stated that about 2 hours PTA he developed bilateral hand weakness and numbness which manifested by dropping things and difficulty buttoning his clothing and putting his shoes on which progressed. They became worried and progressed to the ED for further evaluation.  Yesterday was seen at OSH for retrosternal chest pain/tighness radiating to his left shoulder. He had stress test and was cleared and old it was an anxiety attack.    There are significant psychosocial stressors both he and his wife are dealing with.  No recent illness, fever/chills/diarrhea/cough/rash/myalgias/soreness No tick bites that he knows of however his wife recently had. Never had this before.  ROS: A complete ROS was performed and is negative except as noted in the HPI.  Past Medical History:  Diagnosis Date  . Arthritis    knees and shoulder  . Complication of anesthesia    wakes up to fast   . Diabetes mellitus without complication (HCC)    type 2  . History of kidney stones   . Hypertension     No family history on file.  Social History:  reports that he has never smoked. He has never used smokeless tobacco. He reports current alcohol use. He reports that he does not use drugs.   Prior to Admission medications   Medication Sig Start Date End Date Taking? Authorizing Provider  aspirin EC 325 MG tablet Take 1 tablet (325 mg total) by mouth 2 (two) times daily. 01/15/17   Jene Every, MD  docusate sodium (COLACE) 100 MG capsule Take 1 capsule (100 mg total) by mouth 2 (two) times daily as needed for mild constipation. 01/15/17   Jene Every, MD  HYDROmorphone (DILAUDID) 2 MG tablet Take 1-2 tablets (2-4 mg total) by mouth every 3  (three) hours as needed for severe pain. 01/16/17   Dorothy Spark, PA-C  JANUMET XR 50-1000 MG TB24 Take 1 tablet by mouth 2 (two) times daily. 12/30/16   [provider]  losartan (COZAAR) 100 MG tablet Take 100 mg by mouth daily. 11/06/16   [provider]  methocarbamol (ROBAXIN) 500 MG tablet Take 1 tablet (500 mg total) by mouth every 6 (six) hours as needed for muscle spasms. 01/16/17   Dorothy Spark, PA-C  polyethylene glycol (MIRALAX / GLYCOLAX) packet Take 17 g by mouth daily. 01/15/17   Jene Every, MD    Exam: Current vital signs: BP 136/85   Pulse 92   Temp 99.1 F (37.3 C) (Oral)   Resp 17   Wt 114.3 kg   SpO2 95%   BMI 37.21 kg/m   Physical Exam  Constitutional: Appears well-developed and well-nourished.  Psych: Affect appropriate to situation Eyes: No scleral injection HENT: No OP obstrucion Head: Normocephalic.  Cardiovascular: Normal rate and regular rhythm.  Respiratory: Effort normal and breath sounds normal to anterior ascultation GI: Soft.  No distension. There is no tenderness.  Skin: WDI  Neuro: Mental Status: Patient is awake, alert, oriented to person, place, month, year, and situation. Patient is able to give a clear and coherent history. No signs of aphasia or neglect Cranial Nerves: II: Visual Fields are full. Pupils are equal, round, and reactive to light.  III,IV, VI: EOMI without ptosis or diploplia.  V: Facial  sensation is symmetric to temperature VII: Facial movement is symmetric.  VIII: hearing is intact to voice X: Uvula elevates symmetrically XI: Shoulder shrug is symmetric. XII: tongue is midline without atrophy or fasciculations.  Motor: Tone is normal. Bulk is normal. 5/5 strength proximally in bilateral upper extremities distal upper extremity strength 2/5 Lower extremities 5/5 strength proximally 4-/5 disatlly.  Sensory: Sensory level/decreased sharp at C3 Deep Tendon Reflexes: 2+ and symmetric in the  biceps/ankle and left patella, right knee replacement.  Plantars: Toes are downgoing bilaterally.  Cerebellar: FNF and HKS are intact bilaterally.   I have reviewed the images obtained: Addendum: MRI cervical spine wo/w contrast showed multifocal areas of the cervical spinal cord showing increased T2 signal from ~C3 to T1 with very mild enhancement at the C5-6 level.    Assessment: Mikle Sternberg is a 44 y.o. male with acute rapidly progressing paraparesis and paresthesia with clearly defined sensory level at C3 without autonomic involvement. MRI showed multifocal cervical cord lesions and a few with borderline enhancement. The patient meets criteria for transverse myelitis with acute flaccid paralysis being less likely and will need further emergent diagnostic workup for infectious ([place] and wife had recent tick bite), CNS demyelinating, systemic inflammatory, autoimmune and possible paraneoplastic disorders and emergent evaluation and treatment.  Impression:  Transverse myelitis Rapidly progressing symmetric paraparesis distal upper > lower extremities  Paresthesia - Symmetric Clearly defined sensory level at C3 Abnormal cervical spinal cord MRI   Plan: - Emergent MRI brain, thoracic spine with and without contrast. - After MRI he will need lumbar puncture with cytology including cell count and differential, glucose, protein, culture, oligoclonal bands, AQP4 antibody, enterovirus PCR, cytomegalovirus PCR, Epstein-Barr virus PCR, herpes virus PCR, varicella-zoster virus PCR, and IgG index. - Serum aquaporin-4 antibodies (AQP4), anti-myelin oligodendrocyte glycoprotein antibodies (MOG), arbovirus panel including West Nile virus antibodies. - Methylprednisolone IV 1,000mg  for 5 days. - Will continue to follow and if there is no repsonse may  Electronically signed by: Dr. Marisue Humble Pager: [phone] 12/03/2019, 11:36 PM

## 2019-12-04 NOTE — ED Notes (Signed)
Order placed for extra spinal needle

## 2019-12-05 ENCOUNTER — Inpatient Hospital Stay (HOSPITAL_COMMUNITY): Payer: BC Managed Care – PPO

## 2019-12-05 LAB — CBC WITH DIFFERENTIAL/PLATELET
Abs Immature Granulocytes: 0.13 10*3/uL — ABNORMAL HIGH (ref 0.00–0.07)
Basophils Absolute: 0 10*3/uL (ref 0.0–0.1)
Basophils Relative: 0 %
Eosinophils Absolute: 0 10*3/uL (ref 0.0–0.5)
Eosinophils Relative: 0 %
HCT: 44.7 % (ref 39.0–52.0)
Hemoglobin: 15 g/dL (ref 13.0–17.0)
Immature Granulocytes: 1 %
Lymphocytes Relative: 5 %
Lymphs Abs: 1 10*3/uL (ref 0.7–4.0)
MCH: 30.9 pg (ref 26.0–34.0)
MCHC: 33.6 g/dL (ref 30.0–36.0)
MCV: 92 fL (ref 80.0–100.0)
Monocytes Absolute: 0.3 10*3/uL (ref 0.1–1.0)
Monocytes Relative: 1 %
Neutro Abs: 21.2 10*3/uL — ABNORMAL HIGH (ref 1.7–7.7)
Neutrophils Relative %: 93 %
Platelets: 281 10*3/uL (ref 150–400)
RBC: 4.86 MIL/uL (ref 4.22–5.81)
RDW: 12.7 % (ref 11.5–15.5)
WBC: 22.6 10*3/uL — ABNORMAL HIGH (ref 4.0–10.5)
nRBC: 0 % (ref 0.0–0.2)

## 2019-12-05 LAB — CSF CELL COUNT WITH DIFFERENTIAL
RBC Count, CSF: 0 /mm3
Tube #: 3
WBC, CSF: 3 /mm3 (ref 0–5)

## 2019-12-05 LAB — GLUCOSE, CAPILLARY
Glucose-Capillary: 252 mg/dL — ABNORMAL HIGH (ref 70–99)
Glucose-Capillary: 455 mg/dL — ABNORMAL HIGH (ref 70–99)
Glucose-Capillary: 471 mg/dL — ABNORMAL HIGH (ref 70–99)
Glucose-Capillary: 316 mg/dL — ABNORMAL HIGH (ref 70–99)

## 2019-12-05 LAB — COMPREHENSIVE METABOLIC PANEL
ALT: 151 U/L — ABNORMAL HIGH (ref 0–44)
AST: 58 U/L — ABNORMAL HIGH (ref 15–41)
Albumin: 4 g/dL (ref 3.5–5.0)
Alkaline Phosphatase: 71 U/L (ref 38–126)
Anion gap: 10 (ref 5–15)
BUN: 29 mg/dL — ABNORMAL HIGH (ref 6–20)
CO2: 26 mmol/L (ref 22–32)
Calcium: 10.2 mg/dL (ref 8.9–10.3)
Chloride: 100 mmol/L (ref 98–111)
Creatinine, Ser: 1.18 mg/dL (ref 0.61–1.24)
GFR, Estimated: >60 mL/min (ref >60)
Glucose, Bld: 430 mg/dL — ABNORMAL HIGH (ref 70–99)
Potassium: 4.1 mmol/L (ref 3.5–5.1)
Sodium: 136 mmol/L (ref 135–145)
Total Bilirubin: 1 mg/dL (ref 0.3–1.2)
Total Protein: 7.6 g/dL (ref 6.5–8.1)

## 2019-12-05 LAB — TSH: TSH: 0.264 u[IU]/mL — ABNORMAL LOW (ref 0.350–4.500)

## 2019-12-05 LAB — HEMOGLOBIN A1C
Hgb A1c MFr Bld: 9 % — ABNORMAL HIGH (ref 4.8–5.6)
Mean Plasma Glucose: 211.6 mg/dL

## 2019-12-05 LAB — GLUCOSE, CSF: Glucose, CSF: 171 mg/dL — ABNORMAL HIGH (ref 40–70)

## 2019-12-05 LAB — MAGNESIUM: Magnesium: 2.2 mg/dL (ref 1.7–2.4)

## 2019-12-05 LAB — PROTEIN, CSF: Total  Protein, CSF: 40 mg/dL (ref 15–45)

## 2019-12-05 MED ORDER — LIDOCAINE HCL (PF) 1 % IJ SOLN
5.0000 mL | Freq: Once | INTRAMUSCULAR | Status: AC
  Administered 2019-12-05: 5 mL via INTRADERMAL

## 2019-12-05 MED ORDER — METHOCARBAMOL 500 MG PO TABS
500.0000 mg | ORAL_TABLET | Freq: Four times a day (QID) | ORAL | Status: DC | PRN
  Administered 2019-12-07: 500 mg via ORAL
  Filled 2019-12-05: qty 1

## 2019-12-05 MED ORDER — INSULIN GLARGINE 100 UNIT/ML ~~LOC~~ SOLN
10.0000 [IU] | Freq: Two times a day (BID) | SUBCUTANEOUS | Status: DC
  Administered 2019-12-05 – 2019-12-06 (×2): 10 [IU] via SUBCUTANEOUS
  Filled 2019-12-05 (×3): qty 0.1

## 2019-12-05 MED ORDER — INSULIN ASPART 100 UNIT/ML ~~LOC~~ SOLN
7.0000 [IU] | Freq: Three times a day (TID) | SUBCUTANEOUS | Status: DC
  Administered 2019-12-05 – 2019-12-06 (×2): 7 [IU] via SUBCUTANEOUS

## 2019-12-05 MED ORDER — TRAZODONE HCL 50 MG PO TABS
50.0000 mg | ORAL_TABLET | Freq: Every evening | ORAL | Status: DC | PRN
  Administered 2019-12-05: 50 mg via ORAL
  Filled 2019-12-05: qty 1

## 2019-12-05 MED ORDER — DOCUSATE SODIUM 100 MG PO CAPS: 100.0000 mg | ORAL_CAPSULE | Freq: Two times a day (BID) | ORAL | Status: DC | PRN

## 2019-12-05 NOTE — Progress Notes (Signed)
CRITICAL VALUE ALERT  Critical Value:  CBG 465  Date & Time Notied:  12/05/2019 1255  Provider Notified: Dr. Dayna Barker  Orders Received/Actions taken: Yes

## 2019-12-05 NOTE — Procedures (Signed)
CLINICAL DATA: Signal abnormalities in the cervical cord, query transverse myelitis or demyelinating process  EXAM:  DIAGNOSTIC LUMBAR PUNCTURE UNDER FLUOROSCOPIC GUIDANCE  FLUOROSCOPY TIME: Fluoroscopy Time:  0 minutes, 24 seconds  Radiation Exposure Index (if provided by the fluoroscopic device):  5.4 mGy  Number of Acquired Spot Images: 0  PROCEDURE:  I discussed the risks (including hemorrhage, infection, and nerve damage, among others), benefits, and alternatives to fluoroscopically guided lumbar puncture with the patient.  We specifically discussed the high likelihood of technical success of the procedure.  The patient understood and elected to undergo the procedure.   Standard time-out was employed.  Following sterile skin prep and local anesthetic administration consisting of 1 percent lidocaine, a 5 inch 20 gauge spinal needle was advanced without difficulty into the thecal sac at the L5-S1 level under fluoroscopic guidance.  Clear CSF was returned.  Opening pressure was 3 cm of water.   A total of 12 cc of clear CSF was collected in 4 vials.  The needle was subsequently removed and the skin cleansed and bandaged.  No immediate complications were observed.   IMPRESSION:  Technically successful fluoroscopically guided lumbar puncture at the L5-S1 level, yielding 12 cc of clear CSF.

## 2019-12-05 NOTE — Progress Notes (Addendum)
Inpatient Diabetes Program Recommendations  AACE/ADA: New Consensus Statement on Inpatient Glycemic Control (2015)  Target Ranges:  Prepandial:   less than 140 mg/dL      Peak postprandial:   less than 180 mg/dL (1-2 hours)      Critically ill patients:  140 - 180 mg/dL   Lab Results  Component Value Date   GLUCAP 252 (H) 12/05/2019    Review of Glycemic Control Results for Ivan Jones, Ivan Jones (MRN [id number]) as of 12/05/2019 10:33  Ref. Range 12/04/2019 11:41 12/04/2019 16:49 12/04/2019 21:19 12/05/2019 08:10  Glucose-Capillary Latest Ref Range: 70 - 99 mg/dL 383 (H) 291 (H) 916 (H) 252 (H)   Diabetes history: Type 2 DM Outpatient Diabetes medications: Janumet 50-1000 mg BID Current orders for Inpatient glycemic control: Novolog 0-20 units TID, Novolog 0-5 units QHS Solumedrol 1000 mg QD  Inpatient Diabetes Program Recommendations:    Noted glucose trends, assuming partially related to steroids. A1C is in process.  While in the setting of steroids, consider adding Levemir 10 units BID. Following.   Addendum:  May also require meal coverage with steroids: Novolog 7 units TID (assuming patient is consuming >50% of meals).   Spoke with patient regarding outpatient diabetes management. Verified home medications.  Reviewed patient's current A1c of 9.0%. Explained what a A1c is and what it measures. Also reviewed goal A1c with patient, importance of good glucose control @ home, and blood sugar goals. Reviewed patho of DM, role of pancreas, need for insulin, impact of steroids on glucose trends, vascular changes and comorbidities. Patient will need a meter at discharge. Blood glucose meter (includes lancets and strips) (#[id number]). Reviewed recommended frequency of CBG checks and stressed the importance of close follow up with PCP, especially if discharged on steroids. Discussed potential for insulin at discharge if steroids are on board. Also, discussed [employer] as an option for  monitoring. Reviewed cost, application, benefits and differences between finger sticks. Patient is interested at discharge.  Patient admits to consuming sodas and sugary beverages. Patient reports, "I am changing everything, I got to do better." Reviewed alteratives, plate method and importance of carbohydrate mindfulness. Will order dietitian to speak with patient and outpatient education. Also, will attach endo list to DC summary.  Will follow for plan for insulin at discharge.  Secure chat sent to MD.  Thanks, Lujean Rave, MSN, RNC-OB Diabetes Coordinator [phone] (8a-5p)

## 2019-12-05 NOTE — Progress Notes (Addendum)
NEUROLOGY PROGRESS NOTE  Patient ID: Genesis Paget is a 44 y.o. with PMHx of  has a past medical history of Arthritis, Complication of anesthesia, Diabetes mellitus without complication (HCC), History of kidney stones, and Hypertension, who presented on 10/29 to [facility] w/ retrosternal chest pain radiating to the left shoulder, felt to be an anxiety attack, followed by bilateral hand weakness / numbness on 10/30 (sudden onset). Started on pulse dose steroids 10/31, planned to complete 11/04   Subjective: Patient states that he feels as though his right arm is getting stronger.  No complaints with methylprednisolone. No no major interval events  Exam: Vitals:   12/04/19 2100 12/05/19 0600  BP: 133/83   Pulse: (!) 103   Resp: 19   Temp: 98.7 F (37.1 C) 99.1 F (37.3 C)  SpO2: 95%     ROS General ROS: negative for - chills, fatigue, fever, night sweats, weight gain or weight loss Psychological ROS: negative for - behavioral disorder, hallucinations, memory difficulties, mood swings or suicidal ideation Ophthalmic ROS: negative for - blurry vision, double vision, eye pain or loss of vision Respiratory ROS: negative for - cough, hemoptysis, shortness of breath or wheezing Cardiovascular ROS: negative for - chest pain, dyspnea on exertion, edema or irregular heartbeat Gastrointestinal ROS: negative for - abdominal pain, diarrhea, hematemesis, nausea/vomiting or stool incontinence Genito-Urinary ROS: negative for - dysuria, hematuria, incontinence or urinary frequency/urgency Musculoskeletal ROS: negative for - joint swelling or muscular weakness Neurological ROS: as noted in HPI Dermatological ROS: negative for rash and skin lesion changes        Physical Exam  Constitutional: Appears well-developed and well-nourished.  Psych: Affect appropriate to situation Eyes: No scleral injection HENT: No OP obstrucion Head: Normocephalic.  Cardiovascular: Normal rate and regular  rhythm.  Respiratory: Effort normal, non-labored breathing GI: Soft.  No distension. There is no tenderness.  Skin: WDI   Neuro:  Mental Status: Alert, oriented, thought content appropriate.  Speech fluent without evidence of aphasia.  Able to follow 3 step commands without difficulty. Cranial Nerves: II:  Visual fields grossly normal,  III,IV, VI: ptosis not present, extra-ocular motions intact bilaterally pupils equal, round, reactive to light and accommodation V,VII: smile symmetric, facial light touch sensation normal bilaterally VIII: hearing normal bilaterally IX,X: Palate rises midline XI: bilateral shoulder shrug XII: midline tongue extension Motor: Right : Upper extremity    Left:     Upper extremity 5/5 deltoid       5/5 deltoid 4/5 tricep      5/5 tricep 5/5 biceps      5/5 biceps  4/5wrist flexion     5/5 wrist flexion 4/5 wrist extension     4/5 wrist extension 2/5 finger ext      2/5 finger ext 4/5 finger flexion     3/5 finer flext  Lower extremity     Lower extremity 5/5 hip flexor      5/5 hip flexor 5/5 hip adductors     5/5 hip adductors 5/5 hip abductors     5/5 hip abductors 5/5 quadricep      5/5 quadriceps  5/5 hamstrings     4/5 hamstrings 5/5 plantar flexion       4/5 plantar flexion 5/5 plantar extension     5/5 plantar extension Tone and bulk:normal tone throughout; no atrophy noted Sensory: Pinprick and light touch intact throughout, bilaterally Deep Tendon Reflexes: 2+ and symmetric throughout KJ and UE Plantars: Right: downgoing   Left: downgoing Cerebellar: normal  finger-to-nose,  and normal heel-to-shin test   Medications:  Scheduled:  aspirin EC  325 mg Oral BID   heparin  5,000 Units Subcutaneous Q8H   insulin aspart  0-20 Units Subcutaneous TID WC   insulin aspart  0-5 Units Subcutaneous QHS   lidocaine (PF)  5 mL Intradermal Once   losartan  100 mg Oral Daily   meloxicam  15 mg Oral Daily   Continuous:   methylPREDNISolone (SOLU-MEDROL) injection 1,000 mg (12/05/19 0556)    Pertinent Labs/Diagnostics:  PENDING CSF LABS Pending labs:  - Lumbar puncture with cytology including:  cell count and differential,  Glucose,  protein,  culture,  oligoclonal bands,  AQP4 antibody,  enterovirus PCR,  cytomegalovirus PCR,  Epstein-Barr virus PCR,  herpes virus PCR,  varicella-zoster virus PCR,   IgG index. Arbovirus panel from [place] [facility]  - Serum aquaporin-4 antibodies (AQP4), anti-myelin oligodendrocyte glycoprotein antibodies (MOG), arbovirus panel including West Nile virus antibodies   MR Brain W and Wo Contrast  Result Date: 12/04/2019 CLINICAL DATA:  Decreased hand dexterity. Possible demyelinating disease. EXAM: MRI HEAD WITHOUT AND WITH CONTRAST TECHNIQUE: Multiplanar, multiecho pulse sequences of the brain and surrounding structures were obtained without and with intravenous contrast. CONTRAST:  7mL GADAVIST GADOBUTROL 1 MMOL/ML IV SOLN COMPARISON:  None. FINDINGS: Brain: No acute infarct, acute hemorrhage or extra-axial collection. Normal white matter signal. Normal volume of CSF spaces. No chronic microhemorrhage. Normal midline structures. There is no abnormal contrast enhancement. Vascular: Normal flow voids. Skull and upper cervical spine: Normal marrow signal. Sinuses/Orbits: Negative. Other: None. IMPRESSION: Normal brain MRI. No evidence of demyelinating disease. Electronically Signed   By: Deatra Robinson M.D.   On: 12/04/2019 04:16   MR Cervical Spine W or Wo Contrast  Result Date: 12/03/2019 CLINICAL DATA:  Neck pain, acute, infection suspected. EXAM: MRI CERVICAL SPINE WITHOUT AND WITH CONTRAST TECHNIQUE: Multiplanar and multiecho pulse sequences of the cervical spine, to include the craniocervical junction and cervicothoracic junction, were obtained without and with intravenous contrast. CONTRAST:  60mL GADAVIST GADOBUTROL 1 MMOL/ML IV SOLN COMPARISON:  None. FINDINGS: Please  note that image quality is degraded by motion artifact. Alignment: Reversal of lordosis. Minimal grade 1 C4-5 anterolisthesis. Vertebrae: No focal osseous lesion. No bone marrow edema or abnormal enhancement. Cord: Multifocal T2 hyperintense cord lesions at the C3, C5 through T1 levels. Normal cord caliber. Smaller T2 hyperintense cord lesions are likely present at the C3-4 level as well. Mild hyperintense signal within the cord at the C5-6 level on sagittal postcontrast imaging may reflect artifact versus minimal enhancement. Posterior Fossa, vertebral arteries: Negative. Disc levels: Multilevel desiccation, osteophytosis and disc space loss. C2-3: Uncovertebral and facet hypertrophy. Patent spinal canal and right neural foramen. Mild left neural foraminal narrowing. C3-4: Uncovertebral and facet hypertrophy with shallow central protrusion. Patent spinal canal. Mild bilateral neural foraminal narrowing. C4-5: Uncovertebral and facet hypertrophy with shallow central protrusion. Mild bilateral neural foraminal narrowing. Patent spinal canal. C5-6: Disc osteophyte complex with superimposed right subarticular and left paracentral protrusions abutting the ventral cord. Uncovertebral and facet hypertrophy. Partial effacement of the ventral CSF containing spaces. Patent spinal canal. Mild to moderate bilateral neural foraminal narrowing. C6-7: Disc osteophyte complex with superimposed right foraminal/subarticular protrusion. Uncovertebral and facet hypertrophy. There may be a small inferiorly migrated left paracentral extrusion. Mild spinal canal and moderate bilateral neural foraminal narrowing. C7-T1: Uncovertebral and facet hypertrophy. Mild right greater than left neural foraminal narrowing. Patent spinal canal. Paraspinal tissues: Negative. IMPRESSION: No evidence of discitis osteomyelitis.  Multifocal cervical cord lesions with possible mild enhancement at the C5-6 level. Differential includes transverse myelitis  versus demyelination. Multilevel spondylosis most prominent at the C5-7 levels, further detailed above. Electronically Signed   By: Stana Bunting M.D.   On: 12/03/2019 20:46   MR THORACIC SPINE W WO CONTRAST  Result Date: 12/04/2019 CLINICAL DATA:  Possible demyelination.  Decreased hand dexterity. EXAM: MRI THORACIC WITHOUT AND WITH CONTRAST TECHNIQUE: Multiplanar and multiecho pulse sequences of the thoracic spine were obtained without and with intravenous contrast. CONTRAST:  53mL GADAVIST GADOBUTROL 1 MMOL/ML IV SOLN COMPARISON:  None. FINDINGS: MRI THORACIC SPINE FINDINGS Alignment:  Physiologic. Vertebrae: No fracture, evidence of discitis, or bone lesion. Cord: Hyperintense T2-weighted signal within the cervical spine extends to the T1 level. No abnormal contrast enhancement. Paraspinal and other soft tissues: Negative. Disc levels: No disc herniation or spinal canal stenosis. IMPRESSION: No evidence of demyelinating disease within the thoracic spine. Electronically Signed   By: Deatra Robinson M.D.   On: 12/04/2019 04:20           DG Fluoro Rm 1-60 Min - No Report  Result Date: 12/04/2019 Fluoroscopy was utilized by the requesting physician.  No radiographic interpretation.     Felicie Morn PA-C [facility] [phone]  Assessment:  Braxen Dobek is a 44 y.o. male with acute rapidly progressing paraparesis and paresthesia with clearly defined sensory level at C3 without autonomic involvement. MRI showed multifocal cervical cord lesions and a few with borderline enhancement. The patient meets criteria for transverse myelitis with acute flaccid paralysis being less likely and will need further emergent diagnostic workup for infectious ([place] and [place] had recent tick bite), CNS demyelinating, systemic inflammatory, autoimmune and possible paraneoplastic disorders and emergent evaluation and treatment  Patient has received 1 dose  Solu-Medrol.  Feels as though he is gaining strength already with his grip on his right hand.  LP was successful today and labs pending   Impression: Rapidly progressing symmetrical paraparesis distal upper greater than lower extremities Has varus myelitis Abnormal cervical spine cord MRI  Recommendations: -Continue physical therapy -Continue Solu-Medrol 1000 mg/day for 5 doses today will be dose two  12/05/2019, 10:04 AM  Patient at LP when I attempted to stop by, wife reporting improvement. PA eval as above, discussed with me.

## 2019-12-05 NOTE — Progress Notes (Signed)
PROGRESS NOTE    Ivan Jones  BMW:[id number] DOB: 08-Oct-1975 DOA: 12/03/2019 PCP: Marylen Ponto, MD   Chief Complaint  Patient presents with  . Hand Problem    Brief Narrative: 44 year old male with T2DM, HTN presented with bilateral hand numbness started October 30/2021 at noon, with weakness in the grip could not hold and drink up on the car door. He was seen in the ED MRI brain and thoracic spine normal, MRI cervical spine showed no discitis or osteomyelitis but did show multifocal cord lesions with possible mild enhancement at C5-C6 with transverse myelitis and demyelinating disease on the differential.  Seen by neurology LP unsuccessful in 10/31, underwent LP 11/1 under fluoroscopy. Patient placed on high-dose Solu-Medrol 1000 mg daily x5 days  Subjective: Patient seen and examined, wife at the bedside. He feels his right hand grip is omewhat stronger today. Denies bowel bladder incontinence or any weakness in the lower extremities.  Assessment & Plan:  Rapidly progressing symmetric paraparesis distal upper greater than lower extremities, appreciate neurology input, currently treating for transverse myelitis with high-dose steroids, has abnormal MRI C-spine.  Underwent LP today follow-up further work-up, continue current plan per neurology PT OT.  Type 2 diabetes mellitus without complication: Uncontrolled hyperglycemia in the setting of a steroid.  Holding home Janumet for now and continue sliding scale insulin.  Check hemoglobin A1c. Recent Labs  Lab 12/04/19 0737 12/04/19 1141 12/04/19 1649 12/04/19 2119 12/05/19 0810  GLUCAP 220* 283* 310* 307* 252*   (HCC)  Essential hypertension: Blood pressure fairly stable.  Continue home losartan.  Diet Order            Diet Carb Modified Fluid consistency: Thin; Room service appropriate? Yes  Diet effective now                Morbi obesity with BMI Body mass index is 35.52 kg/m.  Will benefit with weight loss and  atorvastatin.  DVT prophylaxis: heparin injection 5,000 Units Start: 12/04/19 0630 SCDs Start: 12/04/19 0618 Code Status:   Code Status: Full Code  Family Communication: plan of care discussed with patient and his wife at bedside.  Status is: Inpatient Remains inpatient appropriate because:Ongoing diagnostic testing needed not appropriate for outpatient work up and Inpatient level of care appropriate due to severity of illness  Dispo: The patient is from: Home              Anticipated d/c is to: Home              Anticipated d/c date is: > 3 days              Patient currently is not medically stable to d/c.         Consultants:see note  Procedures:see note  Culture/Microbiology    Component Value Date/Time   SDES CYTO CSF 12/05/2019 0903   SPECREQUEST NONE 12/05/2019 0903   CULT PENDING 12/05/2019 0903   REPTSTATUS PENDING 12/05/2019 0903    Other culture-see note  Medications: Scheduled Meds: . aspirin EC  325 mg Oral BID  . heparin  5,000 Units Subcutaneous Q8H  . insulin aspart  0-20 Units Subcutaneous TID WC  . insulin aspart  0-5 Units Subcutaneous QHS  . lidocaine (PF)  5 mL Intradermal Once  . losartan  100 mg Oral Daily  . meloxicam  15 mg Oral Daily   Continuous Infusions: . methylPREDNISolone (SOLU-MEDROL) injection 1,000 mg (12/05/19 0556)    Antimicrobials: Anti-infectives (From admission, onward)   None  Objective: Vitals: Today's Vitals   12/04/19 1845 12/04/19 1934 12/04/19 2100 12/05/19 0600  BP: (!) 156/99 (!) 141/87 133/83   Pulse: (!) 128 (!) 106 (!) 103   Resp: 15 (!) 24 19   Temp:   98.7 F (37.1 C) 99.1 F (37.3 C)  TempSrc:   Oral Oral  SpO2: 98% 95% 95%   Weight:      Height:      PainSc:        Intake/Output Summary (Last 24 hours) at 12/05/2019 1136 Last data filed at 12/05/2019 0800 Gross per 24 hour  Intake 0 ml  Output 400 ml  Net -400 ml   Filed Weights   12/03/19 1814 12/04/19 1435  Weight: 114.3 kg 112.3  kg   Weight change: -2.006 kg  Intake/Output from previous day: 10/31 0701 - 11/01 0700 In: -  Out: 400 [Urine:400] Intake/Output this shift: No intake/output data recorded.  Examination: General exam: AAO x3,NAD, weak appearing. HEENT:Oral mucosa moist, Ear/Nose WNL grossly,dentition normal. Respiratory system: bilaterally clear,no wheezing or crackles,no use of accessory muscle, non tender. Cardiovascular system: S1 & S2 +, regular, No JVD. Gastrointestinal system: Abdomen soft, NT,ND, BS+. Nervous System:Alert, awake, moving extremities and grossly nonfocal, weak bilateral upper extremity grip, intact sensation Extremities: No edema, distal peripheral pulses palpable.  Skin: No rashes,no icterus. MSK: Normal muscle bulk,tone, power  Data Reviewed: I have personally reviewed following labs and imaging studies CBC: Recent Labs  Lab 12/03/19 1622  WBC 13.2*  HGB 14.4  HCT 42.7  MCV 91.6  PLT 254   Basic Metabolic Panel: Recent Labs  Lab 12/03/19 1622  NA 140  K 4.3  CL 104  CO2 24  GLUCOSE 262*  BUN 13  CREATININE 1.16  CALCIUM 9.7   GFR: Estimated Creatinine Clearance: 102 mL/min (by C-G formula based on SCr of 1.16 mg/dL). Liver Function Tests: No results for input(s): AST, ALT, ALKPHOS, BILITOT, PROT, ALBUMIN in the last 168 hours. No results for input(s): LIPASE, AMYLASE in the last 168 hours. No results for input(s): AMMONIA in the last 168 hours. Coagulation Profile: No results for input(s): INR, PROTIME in the last 168 hours. Cardiac Enzymes: No results for input(s): CKTOTAL, CKMB, CKMBINDEX, TROPONINI in the last 168 hours. BNP (last 3 results) No results for input(s): PROBNP in the last 8760 hours. HbA1C: No results for input(s): HGBA1C in the last 72 hours. CBG: Recent Labs  Lab 12/04/19 0737 12/04/19 1141 12/04/19 1649 12/04/19 2119 12/05/19 0810  GLUCAP 220* 283* 310* 307* 252*   Lipid Profile: No results for input(s): CHOL, HDL,  LDLCALC, TRIG, CHOLHDL, LDLDIRECT in the last 72 hours. Thyroid Function Tests: No results for input(s): TSH, T4TOTAL, FREET4, T3FREE, THYROIDAB in the last 72 hours. Anemia Panel: No results for input(s): VITAMINB12, FOLATE, FERRITIN, TIBC, IRON, RETICCTPCT in the last 72 hours. Sepsis Labs: No results for input(s): PROCALCITON, LATICACIDVEN in the last 168 hours.  Recent Results (from the past 240 hour(s))  Respiratory Panel by RT PCR (Flu A&B, Covid) - Nasopharyngeal Swab     Status: None   Collection Time: 12/04/19  6:17 AM   Specimen: Nasopharyngeal Swab  Result Value Ref Range Status   SARS Coronavirus 2 by RT PCR NEGATIVE NEGATIVE Final    Comment: (NOTE) SARS-CoV-2 target nucleic acids are NOT DETECTED.  The SARS-CoV-2 RNA is generally detectable in upper respiratoy specimens during the acute phase of infection. The lowest concentration of SARS-CoV-2 viral copies this assay can detect is 131 copies/mL.  A negative result does not preclude SARS-Cov-2 infection and should not be used as the sole basis for treatment or other patient management decisions. A negative result may occur with  improper specimen collection/handling, submission of specimen other than nasopharyngeal swab, presence of viral mutation(s) within the areas targeted by this assay, and inadequate number of viral copies (<131 copies/mL). A negative result must be combined with clinical observations, patient history, and epidemiological information. The expected result is Negative.  Fact Sheet for Patients:  https://www.moore.com/https://www.fda.gov/media/142436/download  Fact Sheet for Healthcare Providers:  https://www.young.biz/https://www.fda.gov/media/142435/download  This test is no t yet approved or cleared by the [place] FDA and  has been authorized for detection and/or diagnosis of SARS-CoV-2 by FDA under an Emergency Use Authorization (EUA). This EUA will remain  in effect (meaning this test can be used) for the duration of  the COVID-19 declaration under Section 564(b)(1) of the Act, 21 U.S.C. section 360bbb-3(b)(1), unless the authorization is terminated or revoked sooner.     Influenza A by PCR NEGATIVE NEGATIVE Final   Influenza B by PCR NEGATIVE NEGATIVE Final    Comment: (NOTE) The Xpert Xpress SARS-CoV-2/FLU/RSV assay is intended as an aid in  the diagnosis of influenza from Nasopharyngeal swab specimens and  should not be used as a sole basis for treatment. Nasal washings and  aspirates are unacceptable for Xpert Xpress SARS-CoV-2/FLU/RSV  testing.  Fact Sheet for Patients: https://www.moore.com/https://www.fda.gov/media/142436/download  Fact Sheet for Healthcare Providers: https://www.young.biz/https://www.fda.gov/media/142435/download  This test is not yet approved or cleared by the [place] FDA and  has been authorized for detection and/or diagnosis of SARS-CoV-2 by  FDA under an Emergency Use Authorization (EUA). This EUA will remain  in effect (meaning this test can be used) for the duration of the  Covid-19 declaration under Section 564(b)(1) of the Act, 21  U.S.C. section 360bbb-3(b)(1), unless the authorization is  terminated or revoked. Performed at [facility], [address] [place], [place] [address]   CSF culture     Status: None (Preliminary result)   Collection Time: 12/05/19  9:03 AM   Specimen: PATH Cytology CSF; Cerebrospinal Fluid  Result Value Ref Range Status   Specimen Description CYTO CSF  Final   Special Requests NONE  Final   Gram Stain   Final    WBC PRESENT, PREDOMINANTLY MONONUCLEAR NO ORGANISMS SEEN CYTOSPIN SMEAR Performed at [facility], [address] [place], [place] [address]    Culture PENDING  Incomplete   Report Status PENDING  Incomplete     Radiology Studies: MR Brain W and Wo Contrast  Result Date: 12/04/2019 CLINICAL DATA:  Decreased hand dexterity. Possible demyelinating disease. EXAM: MRI HEAD WITHOUT AND WITH CONTRAST TECHNIQUE: Multiplanar, multiecho pulse  sequences of the brain and surrounding structures were obtained without and with intravenous contrast. CONTRAST:  10mL GADAVIST GADOBUTROL 1 MMOL/ML IV SOLN COMPARISON:  None. FINDINGS: Brain: No acute infarct, acute hemorrhage or extra-axial collection. Normal white matter signal. Normal volume of CSF spaces. No chronic microhemorrhage. Normal midline structures. There is no abnormal contrast enhancement. Vascular: Normal flow voids. Skull and upper cervical spine: Normal marrow signal. Sinuses/Orbits: Negative. Other: None. IMPRESSION: Normal brain MRI. No evidence of demyelinating disease. Electronically Signed   By: Deatra RobinsonKevin  Herman M.D.   On: 12/04/2019 04:16   MR Cervical Spine W or Wo Contrast  Result Date: 12/03/2019 CLINICAL DATA:  Neck pain, acute, infection suspected. EXAM: MRI CERVICAL SPINE WITHOUT AND WITH CONTRAST TECHNIQUE: Multiplanar and multiecho pulse sequences  of the cervical spine, to include the craniocervical junction and cervicothoracic junction, were obtained without and with intravenous contrast. CONTRAST:  78mL GADAVIST GADOBUTROL 1 MMOL/ML IV SOLN COMPARISON:  None. FINDINGS: Please note that image quality is degraded by motion artifact. Alignment: Reversal of lordosis. Minimal grade 1 C4-5 anterolisthesis. Vertebrae: No focal osseous lesion. No bone marrow edema or abnormal enhancement. Cord: Multifocal T2 hyperintense cord lesions at the C3, C5 through T1 levels. Normal cord caliber. Smaller T2 hyperintense cord lesions are likely present at the C3-4 level as well. Mild hyperintense signal within the cord at the C5-6 level on sagittal postcontrast imaging may reflect artifact versus minimal enhancement. Posterior Fossa, vertebral arteries: Negative. Disc levels: Multilevel desiccation, osteophytosis and disc space loss. C2-3: Uncovertebral and facet hypertrophy. Patent spinal canal and right neural foramen. Mild left neural foraminal narrowing. C3-4: Uncovertebral and facet  hypertrophy with shallow central protrusion. Patent spinal canal. Mild bilateral neural foraminal narrowing. C4-5: Uncovertebral and facet hypertrophy with shallow central protrusion. Mild bilateral neural foraminal narrowing. Patent spinal canal. C5-6: Disc osteophyte complex with superimposed right subarticular and left paracentral protrusions abutting the ventral cord. Uncovertebral and facet hypertrophy. Partial effacement of the ventral CSF containing spaces. Patent spinal canal. Mild to moderate bilateral neural foraminal narrowing. C6-7: Disc osteophyte complex with superimposed right foraminal/subarticular protrusion. Uncovertebral and facet hypertrophy. There may be a small inferiorly migrated left paracentral extrusion. Mild spinal canal and moderate bilateral neural foraminal narrowing. C7-T1: Uncovertebral and facet hypertrophy. Mild right greater than left neural foraminal narrowing. Patent spinal canal. Paraspinal tissues: Negative. IMPRESSION: No evidence of discitis osteomyelitis. Multifocal cervical cord lesions with possible mild enhancement at the C5-6 level. Differential includes transverse myelitis versus demyelination. Multilevel spondylosis most prominent at the C5-7 levels, further detailed above. Electronically Signed   By: Stana Bunting M.D.   On: 12/03/2019 20:46   MR THORACIC SPINE W WO CONTRAST  Result Date: 12/04/2019 CLINICAL DATA:  Possible demyelination.  Decreased hand dexterity. EXAM: MRI THORACIC WITHOUT AND WITH CONTRAST TECHNIQUE: Multiplanar and multiecho pulse sequences of the thoracic spine were obtained without and with intravenous contrast. CONTRAST:  82mL GADAVIST GADOBUTROL 1 MMOL/ML IV SOLN COMPARISON:  None. FINDINGS: MRI THORACIC SPINE FINDINGS Alignment:  Physiologic. Vertebrae: No fracture, evidence of discitis, or bone lesion. Cord: Hyperintense T2-weighted signal within the cervical spine extends to the T1 level. No abnormal contrast enhancement.  Paraspinal and other soft tissues: Negative. Disc levels: No disc herniation or spinal canal stenosis. IMPRESSION: No evidence of demyelinating disease within the thoracic spine. Electronically Signed   By: Deatra Robinson M.D.   On: 12/04/2019 04:20   DG Fluoro Rm 1-60 Min - No Report  Result Date: 12/04/2019 Fluoroscopy was utilized by the requesting physician.  No radiographic interpretation.   DG FL GUIDED LUMBAR PUNCTURE  Result Date: 12/05/2019 CLINICAL DATA:  Signal abnormalities in the cervical cord, query transverse myelitis or demyelinating process EXAM: DIAGNOSTIC LUMBAR PUNCTURE UNDER FLUOROSCOPIC GUIDANCE FLUOROSCOPY TIME:  Fluoroscopy Time:  0 minutes, 24 seconds Radiation Exposure Index (if provided by the fluoroscopic device): 5.4 mGy Number of Acquired Spot Images: 0 PROCEDURE: I discussed the risks (including hemorrhage, infection, and nerve damage, among others), benefits, and alternatives to fluoroscopically guided lumbar puncture with the patient. We specifically discussed the high likelihood of technical success of the procedure. The patient understood and elected to undergo the procedure. Standard time-out was employed. Following sterile skin prep and local anesthetic administration consisting of 1 percent lidocaine, a 5 inch 20 gauge  spinal needle was advanced without difficulty into the thecal sac at the L5-S1 level under fluoroscopic guidance. Clear CSF was returned. Opening pressure was 3 cm of water. A total of 12 cc of clear CSF was collected in 4 vials. The needle was subsequently removed and the skin cleansed and bandaged. No immediate complications were observed. IMPRESSION: 1. Technically successful fluoroscopically guided lumbar puncture at the L5-S1 level, yielding 12 cc of clear CSF. Electronically Signed   By: Gaylyn Rong M.D.   On: 12/05/2019 10:14     LOS: 1 day   Lanae Boast, MD [facility]  12/05/2019, 11:36 AM

## 2019-12-06 LAB — GLUCOSE, CAPILLARY
Glucose-Capillary: 375 mg/dL — ABNORMAL HIGH (ref 70–99)
Glucose-Capillary: 375 mg/dL — ABNORMAL HIGH (ref 70–99)
Glucose-Capillary: 328 mg/dL — ABNORMAL HIGH (ref 70–99)
Glucose-Capillary: 487 mg/dL — ABNORMAL HIGH (ref 70–99)

## 2019-12-06 LAB — CYTOLOGY - NON PAP

## 2019-12-06 LAB — VDRL, CSF: VDRL Quant, CSF: NONREACTIVE

## 2019-12-06 MED ORDER — TRAZODONE HCL 100 MG PO TABS
100.0000 mg | ORAL_TABLET | Freq: Every evening | ORAL | Status: DC | PRN
  Administered 2019-12-06 – 2019-12-08 (×3): 100 mg via ORAL
  Filled 2019-12-06 (×3): qty 1

## 2019-12-06 MED ORDER — INSULIN GLARGINE 100 UNIT/ML ~~LOC~~ SOLN
15.0000 [IU] | Freq: Two times a day (BID) | SUBCUTANEOUS | Status: DC
  Administered 2019-12-06: 15 [IU] via SUBCUTANEOUS
  Filled 2019-12-06 (×3): qty 0.15

## 2019-12-06 MED ORDER — INSULIN GLARGINE 100 UNIT/ML ~~LOC~~ SOLN
5.0000 [IU] | Freq: Once | SUBCUTANEOUS | Status: AC
  Administered 2019-12-06: 5 [IU] via SUBCUTANEOUS
  Filled 2019-12-06: qty 0.05

## 2019-12-06 MED ORDER — INSULIN ASPART 100 UNIT/ML ~~LOC~~ SOLN
10.0000 [IU] | Freq: Three times a day (TID) | SUBCUTANEOUS | Status: DC
  Administered 2019-12-06 (×2): 10 [IU] via SUBCUTANEOUS

## 2019-12-06 NOTE — Progress Notes (Signed)
PROGRESS NOTE    Ivan Jones  TDD:[id number] DOB: [birth year] DOA: 12/03/2019 PCP: Marylen Ponto, MD   Chief Complaint  Patient presents with  . Hand Problem    Brief Narrative: 44 year old male with T2DM, HTN presented with bilateral hand numbness started October 30/2021 at noon, with weakness in the grip could not hold and drink up on the car door. He was seen in the ED MRI brain and thoracic spine normal, MRI cervical spine showed no discitis or osteomyelitis but did show multifocal cord lesions with possible mild enhancement at C5-C6 with transverse myelitis and demyelinating disease on the differential.  Seen by neurology LP unsuccessful in 10/31, underwent LP 11/1 under fluoroscopy. Patient placed on high-dose Solu-Medrol 1000 mg daily x5 days  Subjective: Seen this morning.  No acute events.  Reports right arm is improving but left arm not so well.   Holding the phone with his hands and watching it.  Assessment & Plan:  Rapidly progressing symmetric paraparesis distal upper greater than lower extremities/transverse myelitis: Responding to high-dose steroids with 1000 mg daily for total 5 days to complete.  Underwent LP no infectious etiology.  Follow further LP studies per neurology.  Neurology following closely.  Appreciate input.   Continue PT OT.  Type 2 diabetes mellitus without complication: Uncontrolled hyperglycemia in the setting of a high dose steroid.  Holding home Janumet for now, increase Lantus further and I will also Premeal insulin and monitor CBG.  Hemoglobin A1c is uncontrolled at 9.0 Recent Labs  Lab 12/05/19 0810 12/05/19 1244 12/05/19 1642 12/05/19 2159 12/06/19 0820  GLUCAP 252* 455* 471* 316* 375*   (HCC)  Essential hypertension: BP stable on losartan.  Leukocytosis in the setting of steroid.  Monitor  Diet Order            Diet Carb Modified Fluid consistency: Thin; Room service appropriate? Yes  Diet effective now                Morbi  obesity with BMI Body mass index is 35.27 kg/m.  Will benefit with weight loss and atorvastatin.  DVT prophylaxis: heparin injection 5,000 Units Start: 12/04/19 0630 SCDs Start: 12/04/19 0618 Code Status:   Code Status: Full Code  Family Communication: plan of care discussed with patient and his wife at bedside.  Status is: Inpatient Remains inpatient appropriate because:Ongoing diagnostic testing needed not appropriate for outpatient work up and Inpatient level of care appropriate due to severity of illness  Dispo: The patient is from: Home              Anticipated d/c is to: Home              Anticipated d/c date is: 2-3 days              Patient currently is not medically stable to d/c.  Consultants:see note  Procedures:see note  Culture/Microbiology    Component Value Date/Time   SDES CYTO CSF 12/05/2019 0903   SDES CYTO CSF 12/05/2019 0903   SPECREQUEST NONE 12/05/2019 0903   SPECREQUEST NONE 12/05/2019 0903   CULT PENDING 12/05/2019 0903   CULT  12/05/2019 0903    NO GROWTH < 24 HOURS Performed at [facility], [address], [place], [place] [address]    REPTSTATUS PENDING 12/05/2019 2831   REPTSTATUS PENDING 12/05/2019 5176    Other culture-see note  Medications: Scheduled Meds: . aspirin EC  325 mg Oral BID  . heparin  5,000 Units Subcutaneous  Q8H  . insulin aspart  0-20 Units Subcutaneous TID WC  . insulin aspart  0-5 Units Subcutaneous QHS  . insulin aspart  7 Units Subcutaneous TID WC  . insulin glargine  10 Units Subcutaneous BID  . lidocaine (PF)  5 mL Intradermal Once  . losartan  100 mg Oral Daily  . meloxicam  15 mg Oral Daily   Continuous Infusions: . methylPREDNISolone (SOLU-MEDROL) injection 1,000 mg (12/06/19 0537)    Antimicrobials: Anti-infectives (From admission, onward)   None     Objective: Vitals: Today's Vitals   12/05/19 2000 12/05/19 2100 12/06/19 0500 12/06/19 0535  BP:  137/84  122/87  Pulse: 85 81  75  Resp: 13 15   16   Temp:  98.4 F (36.9 C)  98.3 F (36.8 C)  TempSrc:  Oral  Oral  SpO2: 95% 98%  99%  Weight:   111.5 kg   Height:      PainSc: 0-No pain       Intake/Output Summary (Last 24 hours) at 12/06/2019 1128 Last data filed at 12/05/2019 1810 Gross per 24 hour  Intake 480 ml  Output 500 ml  Net -20 ml   Filed Weights   12/03/19 1814 12/04/19 1435 12/06/19 0500  Weight: 114.3 kg 112.3 kg 111.5 kg   Weight change: -0.8 kg  Intake/Output from previous day: 11/01 0701 - 11/02 0700 In: 526.8 [P.O.:480; IV Piggyback:46.8] Out: 500 [Urine:500] Intake/Output this shift: No intake/output data recorded.  Examination: General exam: AAOx3 , NAD, weak appearing. HEENT:Oral mucosa moist, Ear/Nose WNL grossly, dentition normal. Respiratory system: bilaterally clear,no wheezing or crackles,no use of accessory muscle Cardiovascular system: S1 & S2 +, No JVD,. Gastrointestinal system: Abdomen soft, NT,ND, BS+ Nervous System:Alert, awake, moving extremities and grossly nonfocal-weak grip bilateral hands but weaker on the left. Extremities: No edema, distal peripheral pulses palpable.  Skin: No rashes,no icterus. MSK: Normal muscle bulk,tone, power  Data Reviewed: I have personally reviewed following labs and imaging studies CBC: Recent Labs  Lab 12/03/19 1622 12/05/19 1142  WBC 13.2* 22.6*  NEUTROABS  --  21.2*  HGB 14.4 15.0  HCT 42.7 44.7  MCV 91.6 92.0  PLT 254 281   Basic Metabolic Panel: Recent Labs  Lab 12/03/19 1622 12/05/19 1142  NA 140 136  K 4.3 4.1  CL 104 100  CO2 24 26  GLUCOSE 262* 430*  BUN 13 29*  CREATININE 1.16 1.18  CALCIUM 9.7 10.2  MG  --  2.2   GFR: Estimated Creatinine Clearance: 99.9 mL/min (by C-G formula based on SCr of 1.18 mg/dL). Liver Function Tests: Recent Labs  Lab 12/05/19 1142  AST 58*  ALT 151*  ALKPHOS 71  BILITOT 1.0  PROT 7.6  ALBUMIN 4.0   No results for input(s): LIPASE, AMYLASE in the last 168 hours. No results for  input(s): AMMONIA in the last 168 hours. Coagulation Profile: No results for input(s): INR, PROTIME in the last 168 hours. Cardiac Enzymes: No results for input(s): CKTOTAL, CKMB, CKMBINDEX, TROPONINI in the last 168 hours. BNP (last 3 results) No results for input(s): PROBNP in the last 8760 hours. HbA1C: Recent Labs    12/05/19 1142  HGBA1C 9.0*   CBG: Recent Labs  Lab 12/05/19 0810 12/05/19 1244 12/05/19 1642 12/05/19 2159 12/06/19 0820  GLUCAP 252* 455* 471* 316* 375*   Lipid Profile: No results for input(s): CHOL, HDL, LDLCALC, TRIG, CHOLHDL, LDLDIRECT in the last 72 hours. Thyroid Function Tests: Recent Labs    12/05/19 1142  TSH 0.264*   Anemia Panel: No results for input(s): VITAMINB12, FOLATE, FERRITIN, TIBC, IRON, RETICCTPCT in the last 72 hours. Sepsis Labs: No results for input(s): PROCALCITON, LATICACIDVEN in the last 168 hours.  Recent Results (from the past 240 hour(s))  Respiratory Panel by RT PCR (Flu A&B, Covid) - Nasopharyngeal Swab     Status: None   Collection Time: 12/04/19  6:17 AM   Specimen: Nasopharyngeal Swab  Result Value Ref Range Status   SARS Coronavirus 2 by RT PCR NEGATIVE NEGATIVE Final    Comment: (NOTE) SARS-CoV-2 target nucleic acids are NOT DETECTED.  The SARS-CoV-2 RNA is generally detectable in upper respiratoy specimens during the acute phase of infection. The lowest concentration of SARS-CoV-2 viral copies this assay can detect is 131 copies/mL. A negative result does not preclude SARS-Cov-2 infection and should not be used as the sole basis for treatment or other patient management decisions. A negative result may occur with  improper specimen collection/handling, submission of specimen other than nasopharyngeal swab, presence of viral mutation(s) within the areas targeted by this assay, and inadequate number of viral copies (<131 copies/mL). A negative result must be combined with clinical observations, patient  history, and epidemiological information. The expected result is Negative.  Fact Sheet for Patients:  https://www.moore.com/  Fact Sheet for Healthcare Providers:  https://www.young.biz/  This test is no t yet approved or cleared by the [place] FDA and  has been authorized for detection and/or diagnosis of SARS-CoV-2 by FDA under an Emergency Use Authorization (EUA). This EUA will remain  in effect (meaning this test can be used) for the duration of the COVID-19 declaration under Section 564(b)(1) of the Act, 21 U.S.C. section 360bbb-3(b)(1), unless the authorization is terminated or revoked sooner.     Influenza A by PCR NEGATIVE NEGATIVE Final   Influenza B by PCR NEGATIVE NEGATIVE Final    Comment: (NOTE) The Xpert Xpress SARS-CoV-2/FLU/RSV assay is intended as an aid in  the diagnosis of influenza from Nasopharyngeal swab specimens and  should not be used as a sole basis for treatment. Nasal washings and  aspirates are unacceptable for Xpert Xpress SARS-CoV-2/FLU/RSV  testing.  Fact Sheet for Patients: https://www.moore.com/  Fact Sheet for Healthcare Providers: https://www.young.biz/  This test is not yet approved or cleared by the [place] FDA and  has been authorized for detection and/or diagnosis of SARS-CoV-2 by  FDA under an Emergency Use Authorization (EUA). This EUA will remain  in effect (meaning this test can be used) for the duration of the  Covid-19 declaration under Section 564(b)(1) of the Act, 21  U.S.C. section 360bbb-3(b)(1), unless the authorization is  terminated or revoked. Performed at [facility], [address], [place], [place] [address]   Anaerobic culture     Status: None (Preliminary result)   Collection Time: 12/05/19  9:03 AM   Specimen: PATH Cytology CSF; Cerebrospinal Fluid  Result Value Ref Range Status   Specimen Description CYTO CSF  Final     Special Requests NONE  Final   Gram Stain   Final    NO WBC SEEN NO ORGANISMS SEEN Performed at [facility], [address], [place], [place] [address]    Culture PENDING  Incomplete   Report Status PENDING  Incomplete  CSF culture     Status: None (Preliminary result)   Collection Time: 12/05/19  9:03 AM   Specimen: PATH Cytology CSF; Cerebrospinal Fluid  Result Value Ref Range Status   Specimen  Description CYTO CSF  Final   Special Requests NONE  Final   Gram Stain   Final    WBC PRESENT, PREDOMINANTLY MONONUCLEAR NO ORGANISMS SEEN CYTOSPIN SMEAR    Culture   Final    NO GROWTH < 24 HOURS Performed at [facility], [address] [place] [address] [place], [place] [id number]    Report Status PENDING  Incomplete     Radiology Studies: DG Fluoro Rm 1-60 Min - No Report  Result Date: 12/04/2019 CLINICAL DATA:  Request for lumbar puncture under fluoroscopic guidance. EXAM: DIAGNOSTIC LUMBAR PUNCTURE UNDER FLUOROSCOPIC GUIDANCE FLUOROSCOPY TIME:  Fluoroscopy Time: Not recorded (fluoroscopy unit malfunction) Radiation Exposure Index (if provided by the fluoroscopic device): None applicable Number of Acquired Spot Images: 0 PROCEDURE: Informed consent was obtained from the patient prior to the procedure, including potential complications of headache, allergy, and pain. With the patient prone, the lower back was prepped with Betadine. 1% Lidocaine was used for local anesthesia. Lumbar puncture was performed at the L3-4 and L2-3 levels using a 21 gauge needle with return of no fluid. The table malfunctioned during the procedure and procedure was terminated prior to obtaining fluid. IMPRESSION: Attempted lumbar puncture under fluoroscopic guidance. Fluid was not obtained for analysis. Procedure will be rescheduled for 12/05/2019. Electronically Signed   By: Norva PavlovElizabeth  Brown M.D.   On: 12/04/2019 14:40   DG FL GUIDED LUMBAR PUNCTURE  Result Date: 12/05/2019 CLINICAL DATA:  Signal abnormalities  in the cervical cord, query transverse myelitis or demyelinating process EXAM: DIAGNOSTIC LUMBAR PUNCTURE UNDER FLUOROSCOPIC GUIDANCE FLUOROSCOPY TIME:  Fluoroscopy Time:  0 minutes, 24 seconds Radiation Exposure Index (if provided by the fluoroscopic device): 5.4 mGy Number of Acquired Spot Images: 0 PROCEDURE: I discussed the risks (including hemorrhage, infection, and nerve damage, among others), benefits, and alternatives to fluoroscopically guided lumbar puncture with the patient. We specifically discussed the high likelihood of technical success of the procedure. The patient understood and elected to undergo the procedure. Standard time-out was employed. Following sterile skin prep and local anesthetic administration consisting of 1 percent lidocaine, a 5 inch 20 gauge spinal needle was advanced without difficulty into the thecal sac at the L5-S1 level under fluoroscopic guidance. Clear CSF was returned. Opening pressure was 3 cm of water. A total of 12 cc of clear CSF was collected in 4 vials. The needle was subsequently removed and the skin cleansed and bandaged. No immediate complications were observed. IMPRESSION: 1. Technically successful fluoroscopically guided lumbar puncture at the L5-S1 level, yielding 12 cc of clear CSF. Electronically Signed   By: Gaylyn RongWalter  Liebkemann M.D.   On: 12/05/2019 10:14     LOS: 2 days   Lanae Boastamesh Winfrey Chillemi, MD [facility]  12/06/2019, 11:28 AM

## 2019-12-06 NOTE — Progress Notes (Addendum)
NEUROLOGY PROGRESS NOTE  Patient ID: Ivan Jones is a 44 y.o. with PMHx of  has a past medical history of Arthritis, Complication of anesthesia, Diabetes mellitus without complication (HCC), History of kidney stones, and Hypertension, who presented on 10/29 to [facility] w/ retrosternal chest pain radiating to the left shoulder, felt to be an anxiety attack, followed by bilateral hand weakness / numbness on 10/30 (sudden onset). Started on pulse dose steroids 10/31, planned to complete 11/04   Subjective: Patient feels good.  He does state that he is sleepy due to multiple things that are on his mind.  He denies any form of lumbar puncture headache.  Still feels that his right side is improving and also feels that his left side is also improving but not as well as the right side.  Exam: Vitals:   12/05/19 2100 12/06/19 0535  BP: 137/84 122/87  Pulse: 81 75  Resp: 15 16  Temp: 98.4 F (36.9 C) 98.3 F (36.8 C)  SpO2: 98% 99%    ROS General ROS: negative for - chills, fatigue, fever, night sweats, weight gain or weight loss Psychological ROS: negative for - behavioral disorder, hallucinations, memory difficulties, mood swings or suicidal ideation Ophthalmic ROS: negative for - blurry vision, double vision, eye pain or loss of vision ENT ROS: negative for - epistaxis, nasal discharge, oral lesions, sore throat, tinnitus or vertigo Respiratory ROS: negative for - cough, hemoptysis, shortness of breath or wheezing Cardiovascular ROS: negative for - chest pain, dyspnea on exertion, edema or irregular heartbeat Gastrointestinal ROS: negative for - abdominal pain, diarrhea, hematemesis, nausea/vomiting or stool incontinence Genito-Urinary ROS: negative for - dysuria, hematuria, incontinence or urinary frequency/urgency Musculoskeletal ROS: Positive for - muscular weakness Neurological ROS: as noted in HPI Dermatological ROS: negative for rash and skin lesion changes    Physical Exam   Constitutional: Appears well-developed and well-nourished.  Psych: Affect appropriate to situation Eyes: No scleral injection HENT: No OP obstrucion Head: Normocephalic.  Cardiovascular: Normal rate and regular rhythm.  Respiratory: Effort normal, non-labored breathing GI: Soft.  No distension. There is no tenderness.  Skin: WDI   Neuro:  Mental Status: Alert, oriented, thought content appropriate.  Speech fluent without evidence of aphasia.  Able to follow 3 step commands without difficulty. Cranial Nerves: II:  Visual fields grossly normal,  III,IV, VI: ptosis not present, extra-ocular motions intact bilaterally pupils equal, round, reactive to light and accommodation V,VII: smile symmetric, facial light touch sensation normal bilaterally VIII: hearing normal bilaterally XI: bilateral shoulder shrug XII: midline tongue extension Motor: Right :  Upper extremity                                              Left:     Upper extremity 5/5 deltoid                                                                   5/5 deltoid 4/5 tricep  5/5 tricep 5/5 biceps                                                                    5/5 biceps  4/5wrist flexion                                                            5/5 wrist flexion 5/5 wrist extension                                                      4/5 wrist extension 4/5 finger ext                                                               4/5 finger ext 4/5 finger flexion                                                         4/5 finer flext             Lower extremity                                                          Lower extremity 5/5 hip flexor                                                               5/5 hip flexor 5/5 hip adductors                                                        5/5 hip adductors 5/5 hip abductors                                                         5/5 hip abductors 5/5 quadricep  5/5 quadriceps  5/5 hamstrings                                                            5/5 hamstrings Tone and bulk:normal tone throughout; no atrophy noted Sensory: States that bilateral hands still have tingling sensation with left greater than right.  Right/left forearmcold temperature intact however, on his hand palm is greater warmth sensation then dorsal.  Medications:  Scheduled: . aspirin EC  325 mg Oral BID  . heparin  5,000 Units Subcutaneous Q8H  . insulin aspart  0-20 Units Subcutaneous TID WC  . insulin aspart  0-5 Units Subcutaneous QHS  . insulin aspart  7 Units Subcutaneous TID WC  . insulin glargine  10 Units Subcutaneous BID  . lidocaine (PF)  5 mL Intradermal Once  . losartan  100 mg Oral Daily  . meloxicam  15 mg Oral Daily    Pertinent Labs/Diagnostics: Results for Ivan, Jones (MRN [id number]) as of 12/06/2019 10:21  Ref. Range 12/05/2019 09:03  Appearance, CSF Latest Ref Range: CLEAR  CLEAR (A)  Glucose, CSF Latest Ref Range: 40 - 70 mg/dL 701 (H)  RBC Count, CSF Latest Ref Range: 0 /cu mm 0  WBC, CSF Latest Ref Range: 0 - 5 /cu mm 3  Other Cells, CSF Unknown TOO FEW TO COUNT, SMEAR AVAILABLE FOR REVIEW  Color, CSF Latest Ref Range: COLORLESS  COLORLESS  Supernatant Unknown NOT INDICATED  Total  Protein, CSF Latest Ref Range: 15 - 45 mg/dL 40  Tube # Unknown 3  PENDING CSF LABS Pending labs:  - Lumbar puncture with cytology including:   culture-negative oligoclonal bands-pending AQP4 antibody-pending enterovirus PCR-pending cytomegalovirus PCR-pending Epstein-Barr virus PCR-pending herpes virus PCR-pending varicella-zoster virus PCR-pending  IgG index-pending [facility] panel [facility]-pending   - Serum aquaporin-4 antibodies (AQP4), anti-myelin oligodendrocyte glycoprotein antibodies (MOG), arbovirus panel including [place] virus antibodies -All pending  DG Fluoro Rm 1-60 Min - No Report  Result Date: 12/04/2019 CLINICAL DATA:  Request for lumbar puncture under fluoroscopic guidance. EXAM: DIAGNOSTIC LUMBAR PUNCTURE UNDER FLUOROSCOPIC GUIDANCE FLUOROSCOPY TIME:  Fluoroscopy Time: Not recorded (fluoroscopy unit malfunction) Radiation Exposure Index (if provided by the fluoroscopic device): None applicable Number of Acquired Spot Images: 0 PROCEDURE: Informed consent was obtained from the patient prior to the procedure, including potential complications of headache, allergy, and pain. With the patient prone, the lower back was prepped with Betadine. 1% Lidocaine was used for local anesthesia. Lumbar puncture was performed at the L3-4 and L2-3 levels using a 21 gauge needle with return of no fluid. The table malfunctioned during the procedure and procedure was terminated prior to obtaining fluid. IMPRESSION: Attempted lumbar puncture under fluoroscopic guidance. Fluid was not obtained for analysis. Procedure will be rescheduled for 12/05/2019. Electronically Signed   By: Norva Pavlov M.D.   On: 12/04/2019 14:40   DG FL GUIDED LUMBAR PUNCTURE  Result Date: 12/05/2019 CLINICAL DATA:  Signal abnormalities in the cervical cord, query transverse myelitis or demyelinating process EXAM: DIAGNOSTIC LUMBAR PUNCTURE UNDER FLUOROSCOPIC GUIDANCE FLUOROSCOPY TIME:  Fluoroscopy Time:  0 minutes, 24 seconds Radiation Exposure Index (if provided by the fluoroscopic device): 5.4 mGy Number of Acquired Spot Images: 0 PROCEDURE: I discussed the risks (including hemorrhage, infection, and nerve damage, among others), benefits, and alternatives to fluoroscopically  guided lumbar puncture with the patient. We specifically discussed the high likelihood of technical success of the procedure. The patient understood and elected to undergo the procedure. Standard time-out was employed. Following sterile skin prep and local anesthetic  administration consisting of 1 percent lidocaine, a 5 inch 20 gauge spinal needle was advanced without difficulty into the thecal sac at the L5-S1 level under fluoroscopic guidance. Clear CSF was returned. Opening pressure was 3 cm of water. A total of 12 cc of clear CSF was collected in 4 vials. The needle was subsequently removed and the skin cleansed and bandaged. No immediate complications were observed. IMPRESSION: 1. Technically successful fluoroscopically guided lumbar puncture at the L5-S1 level, yielding 12 cc of clear CSF. Electronically Signed   By: Gaylyn Rong M.D.   On: 12/05/2019 10:14     Jaidan Prevette PA-C [facility] [phone]  Assessment:  Ivan Jones is a 44 y.o. male with acute rapidly progressing paraparesis and paresthesia with clearly defined sensory level at C3 without autonomic involvement. MRI showed multifocal cervical cord lesions and a few with borderline enhancement. The patient meets criteria for transverse myelitis with acute flaccid paralysis being less likely and will need further emergent diagnostic workup for infectious ([place] and [place] had recent tick bite), CNS demyelinating, systemic inflammatory, autoimmune and possible paraneoplastic disorders and emergent evaluation and treatment   Patient has received 1 dose Solu-Medrol.  Feels as though he is gaining strength already with his grip on his right hand.  LP was successful today and labs pending     Impression: This is a 44 year old gentleman with transverse myelitis improving on pulse dose steroids.  Lumbar puncture is reassuring against infection, as is his improvement with his steroids.  Recommendations: -Continue physical therapy -Continue Solu-Medrol 1000 mg/day for 5 doses today will be dose 3 -We will need continued physical therapy as outpatient -Will need follow-up with outpatient neurolog[facility]   12/06/2019, 10:14 AM   MD addendum Agree with history, physical, assessment and plan as documented above.  I personally fully evaluated the patient in conjunction with the PA and made clarifications and additions to the note as necessary  We spent 30 minutes in direct care of this patient today, over 50% of which was at bedside during which time we reviewed in detail the patient's clinical course, projected recovery (and how this may not be complete), need for neurological follow-up, and lack of indication for ongoing immunosuppression at this time with a plan to complete steroids for 5-day course and then follow-up outpatient to review additional labs that result and discuss whether immunosuppression may be indicated at that time.  Brooke Dare MD-PhD [facility] [phone]

## 2019-12-06 NOTE — Progress Notes (Signed)
Brief Nutrition Education Note  RD consulted for nutrition education regarding diabetes.   Lab Results  Component Value Date   HGBA1C 9.0 (H) 12/05/2019   PTA DM medications are 50-1000 mg sitagliptin-metformin BID.   Labs reviewed: CBGS: [id number] (inpatient orders for glycemic control are 0-20 units insulin aspart TID, 0-5 units insulin aspart daily at bedtime, 10 units insulin aspart TID with meals, and 15 units insulin glargine BID).   Attempted to speak with pt via call to hospital room phone, however, unable to reach.   Pt with hyperglycemia secondary to steroid use.   RD provided "Carbohydrate Counting for People with Diabetes" handout from the [employer]. Attached handout to AVS/ discharge instruction.  Body mass index is 35.27 kg/m. Pt meets criteria for obesity, class II based on current BMI. Obesity is a complex, chronic medical condition that is optimally managed by a multidisciplinary care team. Weight loss is not an ideal goal for an acute inpatient hospitalization. However, if further work-up for obesity is warranted, consider outpatient referral to outpatient bariatric service and/or [facility].   Current diet order is carb modified, patient is consuming approximately 100% of meals at this time. Labs and medications reviewed. No further nutrition interventions warranted at this time. RD contact information provided. If additional nutrition issues arise, please re-consult RD.  Levada Schilling, RD, LDN, CDCES Registered Dietitian II Certified Diabetes Care and Education Specialist Please refer to [facility] for RD and/or RD on-call/weekend/after hours pager

## 2019-12-06 NOTE — Discharge Instructions (Signed)
[facility] [phone]) 1. Dr. Philemon Kingdom 2. Dr. Janie Morning [facility] [phone]) 1. Dr. Delrae Rend [facility] [phone]) 1. Dr. Jacelyn Pi 2. Dr. Anda Kraft [facility] [phone]) 1. Dr. Daneil Dolin [facility] [phone]) [[place] office]  [phone]) [[place] office] 1. Dr. Lenna Sciara Solum 2. Dr. Mee Hives [facility]) [phone]) 1. Autumn Hudnall Ronnald Ramp), PA 2. Dr. Amalia Greenhouse 3. Dr. Marsh Dolly. [facility] [phone]) 1. Dr. Glade Lloyd [facility] [phone]) 1. Dr. Orville Govern 2. Dr. Lelon Huh 3. Dr. Jerelene Redden 4. Alwyn Ren, FNP Dr. Carolynn Serve. [facility] [place] [phone]) Hypoglycemia Hypoglycemia is when the sugar (glucose) level in your blood is too low. Signs of low blood sugar may include:  Feeling: ? Hungry. ? Worried or nervous (anxious). ? Sweaty and clammy. ? Confused. ? Dizzy. ? Sleepy. ? Sick to your stomach (nauseous).  Having: ? A fast heartbeat. ? A headache. ? A change in your vision. ? Tingling or no feeling (numbness) around your mouth, lips, or tongue. ? Jerky movements that you cannot control (seizure).  Having trouble with: ? Moving (coordination). ? Sleeping. ? Passing out (fainting). ? Getting upset easily (irritability). Low blood sugar can happen to people who have diabetes and people who do not have diabetes. Low blood sugar can happen quickly, and it can be an emergency. Treating low blood sugar Low blood sugar is often treated by eating or drinking something sugary right away, such as:  Fruit juice, 4-6 oz (120-150 mL).  Regular soda (not diet soda), 4-6 oz (120-150 mL).  Low-fat milk, 4 oz (120 mL).  Several pieces of hard candy.  Sugar or honey, 1 Tbsp (15  mL). Treating low blood sugar if you have diabetes If you can think clearly and swallow safely, follow the 15:15 rule:  Take 15 grams of a fast-acting carb (carbohydrate). Talk with your doctor about how much you should take.  Always keep a source of fast-acting carb with you, such as: ? Sugar tablets (glucose pills). Take 3-4 pills. ? 6-8 pieces of hard candy. ? 4-6 oz (120-150 mL) of fruit juice. ? 4-6 oz (120-150 mL) of regular (not diet) soda. ? 1 Tbsp (15 mL) honey or sugar.  Check your blood sugar 15 minutes after you take the carb.  If your blood sugar is still at or below 70 mg/dL (3.9 mmol/L), take 15 grams of a carb again.  If your blood sugar does not go above 70 mg/dL (3.9 mmol/L) after 3 tries, get help right away.  After your blood sugar goes back to normal, eat a meal or a snack within 1 hour.  Treating very low blood sugar If your blood sugar is at or below 54 mg/dL (3 mmol/L), you have very low blood sugar (severe hypoglycemia). This may also cause:  Passing out.  Jerky movements you cannot control (seizure).  Losing consciousness (coma). This is an emergency. Do not wait to see if the symptoms will go away. Get medical help right away. Call your local emergency services (911 in the [place] Do not drive yourself to the hospital. If you have very low blood sugar and you cannot eat or drink, you may need a glucagon shot (injection). A family member or friend should learn how to check your blood sugar and how to give you a glucagon shot. Ask your doctor if you need to have a glucagon shot  kit at home. Follow these instructions at home: General instructions  Take over-the-counter and prescription medicines only as told by your doctor.  Stay aware of your blood sugar as told by your doctor.  Limit alcohol intake to no more than 1 drink a day for nonpregnant women and 2 drinks a day for men. One drink equals 12 oz of beer (355 mL), 5 oz of wine (148 mL), or 1 oz of  hard liquor (44 mL).  Keep all follow-up visits as told by your doctor. This is important. If you have diabetes:   Follow your diabetes care plan as told by your doctor. Make sure you: ? Know the signs of low blood sugar. ? Take your medicines as told. ? Follow your exercise and meal plan. ? Eat on time. Do not skip meals. ? Check your blood sugar as often as told by your doctor. Always check it before and after exercise. ? Follow your sick day plan when you cannot eat or drink normally. Make this plan ahead of time with your doctor.  Share your diabetes care plan with: ? Your work or school. ? People you live with.  Check your pee (urine) for ketones: ? When you are sick. ? As told by your doctor.  Carry a card or wear jewelry that says you have diabetes. Contact a doctor if:  You have trouble keeping your blood sugar in your target range.  You have low blood sugar often. Get help right away if:  You still have symptoms after you eat or drink something sugary.  Your blood sugar is at or below 54 mg/dL (3 mmol/L).  You have jerky movements that you cannot control.  You pass out. These symptoms may be an emergency. Do not wait to see if the symptoms will go away. Get medical help right away. Call your local emergency services (911 in the [place] Do not drive yourself to the hospital. Summary  Hypoglycemia happens when the level of sugar (glucose) in your blood is too low.  Low blood sugar can happen to people who have diabetes and people who do not have diabetes. Low blood sugar can happen quickly, and it can be an emergency.  Make sure you know the signs of low blood sugar and know how to treat it.  Always keep a source of sugar (fast-acting carb) with you to treat low blood sugar. This information is not intended to replace advice given to you by your health care provider. Make sure you discuss any questions you have with your health care provider. Document Revised:  05/13/2018 Document Reviewed: 02/23/2015 Elsevier Patient Education  [place].  Hemoglobin A1c Test Why am I having this test? You may have the hemoglobin A1c test (HbA1c test) done to:  Evaluate your risk for developing diabetes (diabetes mellitus).  Diagnose diabetes.  Monitor long-term control of blood sugar (glucose) in people who have diabetes and help make treatment decisions. This test may be done with other blood glucose tests, such as fasting blood glucose and oral glucose tolerance tests. What is being tested? Hemoglobin is a type of protein in the blood that carries oxygen. Glucose attaches to hemoglobin to form glycated hemoglobin. This test checks the amount of glycated hemoglobin in your blood, which is a good indicator of the average amount of glucose in your blood during the past 2-3 months. What kind of sample is taken?  A blood sample is required for this test. It is usually collected by inserting  a needle into a blood vessel. Tell a health care provider about:  All medicines you are taking, including vitamins, herbs, eye drops, creams, and over-the-counter medicines.  Any blood disorders you have.  Any surgeries you have had.  Any medical conditions you have.  Whether you are pregnant or may be pregnant. How are the results reported? Your results will be reported as a percentage that indicates how much of your hemoglobin has glucose attached to it (is glycated). Your health care provider will compare your results to normal ranges that were established after testing a large group of people (reference ranges). Reference ranges may vary among labs and hospitals. For this test, common reference ranges are:  Adult or child without diabetes: 4-5.6%.  Adult or child with diabetes and good blood glucose control: less than 7%. What do the results mean? If you have diabetes:  A result of less than 7% is considered normal, meaning that your blood glucose is  well controlled.  A result higher than 7% means that your blood glucose is not well controlled, and your treatment plan may need to be adjusted. If you do not have diabetes:  A result within the reference range is considered normal, meaning that you are not at high risk for diabetes.  A result of 5.7-6.4% means that you have a high risk of developing diabetes, and you may have prediabetes. Prediabetes is the condition of having a blood glucose level that is higher than it should be, but not high enough for you to be diagnosed with diabetes. Having prediabetes puts you at risk for developing type 2 diabetes (type 2 diabetes mellitus). You may have more tests, including a repeat HbA1c test.  Results of 6.5% or higher on two separate HbA1c tests mean that you have diabetes. You may have more tests to confirm the diagnosis. Abnormally low HbA1c values may be caused by:  Pregnancy.  Severe blood loss.  Receiving donated blood (transfusions).  Low red blood cell count (anemia).  Long-term kidney failure.  Some unusual forms (variants) of hemoglobin. Talk with your health care provider about what your results mean. Questions to ask your health care provider Ask your health care provider, or the department that is doing the test:  When will my results be ready?  How will I get my results?  What are my treatment options?  What other tests do I need?  What are my next steps? Summary  The hemoglobin A1c test (HbA1c test) may be done to evaluate your risk for developing diabetes, to diagnose diabetes, and to monitor long-term control of blood sugar (glucose) in people who have diabetes and help make treatment decisions.  Hemoglobin is a type of protein in the blood that carries oxygen. Glucose attaches to hemoglobin to form glycated hemoglobin. This test checks the amount of glycated hemoglobin in your blood, which is a good indicator of the average amount of glucose in your blood during  the past 2-3 months.  Talk with your health care provider about what your results mean. This information is not intended to replace advice given to you by your health care provider. Make sure you discuss any questions you have with your health care provider. Document Revised: 01/02/2017 Document Reviewed: 09/02/2016 Elsevier Patient Education  [place].  Hyperglycemia Hyperglycemia occurs when the level of sugar (glucose) in the blood is too high. Glucose is a type of sugar that provides the body's main source of energy. Certain hormones (insulin and glucagon) control the  level of glucose in the blood. Insulin lowers blood glucose, and glucagon increases blood glucose. Hyperglycemia can result from having too little insulin in the bloodstream, or from the body not responding normally to insulin. Hyperglycemia occurs most often in people who have diabetes (diabetes mellitus), but it can happen in people who do not have diabetes. It can develop quickly, and it can be life-threatening if it causes you to become severely dehydrated (diabetic ketoacidosis or hyperglycemic hyperosmolar state). Severe hyperglycemia is a medical emergency. What are the causes? If you have diabetes, hyperglycemia may be caused by:  Diabetes medicine.  Medicines that increase blood glucose or affect your diabetes control.  Not eating enough, or not eating often enough.  Changes in physical activity level.  Being sick or having an infection. If you have prediabetes or undiagnosed diabetes:  Hyperglycemia may be caused by those conditions. If you do not have diabetes, hyperglycemia may be caused by:  Certain medicines, including steroid medicines, beta-blockers, epinephrine, and thiazide diuretics.  Stress.  Serious illness.  Surgery.  Diseases of the pancreas.  Infection. What increases the risk? Hyperglycemia is more likely to develop in people who have risk factors for diabetes, such  as:  Having a family member with diabetes.  Having a gene for type 1 diabetes that is passed from parent to child (inherited).  Living in an area with cold weather conditions.  Exposure to certain viruses.  Certain conditions in which the body's disease-fighting (immune) system attacks itself (autoimmune disorders).  Being overweight or obese.  Having an inactive (sedentary) lifestyle.  Having been diagnosed with insulin resistance.  Having a history of prediabetes, gestational diabetes, or polycystic ovarian syndrome (PCOS).  Being of American-Indian, African-American, Hispanic/Latino, or Asian/Pacific Islander descent. What are the signs or symptoms? Hyperglycemia may not cause any symptoms. If you do have symptoms, they may include early warning signs, such as:  Increased thirst.  Hunger.  Feeling very tired.  Needing to urinate more often than usual.  Blurry vision. Other symptoms may develop if hyperglycemia gets worse, such as:  Dry mouth.  Loss of appetite.  Fruity-smelling breath.  Weakness.  Unexpected or rapid weight gain or weight loss.  Tingling or numbness in the hands or feet.  Headache.  Skin that does not quickly return to normal after being lightly pinched and released (poor skin turgor).  Abdominal pain.  Cuts or bruises that are slow to heal. How is this diagnosed? Hyperglycemia is diagnosed with a blood test to measure your blood glucose level. This blood test is usually done while you are having symptoms. Your health care provider may also do a physical exam and review your medical history. You may have more tests to determine the cause of your hyperglycemia, such as:  A fasting blood glucose (FBG) test. You will not be allowed to eat (you will fast) for at least 8 hours before a blood sample is taken.  An A1c (hemoglobin A1c) blood test. This provides information about blood glucose control over the previous 2-3 months.  An oral  glucose tolerance test (OGTT). This measures your blood glucose at two times: ? After fasting. This is your baseline blood glucose level. ? Two hours after drinking a beverage that contains glucose. How is this treated? Treatment depends on the cause of your hyperglycemia. Treatment may include:  Taking medicine to regulate your blood glucose levels. If you take insulin or other diabetes medicines, your medicine or dosage may be adjusted.  Lifestyle changes, such as  exercising more, eating healthier foods, or losing weight.  Treating an illness or infection, if this caused your hyperglycemia.  Checking your blood glucose more often.  Stopping or reducing steroid medicines, if these caused your hyperglycemia. If your hyperglycemia becomes severe and it results in hyperglycemic hyperosmolar state, you must be hospitalized and given IV fluids. Follow these instructions at home:  General instructions  Take over-the-counter and prescription medicines only as told by your health care provider.  Do not use any products that contain nicotine or tobacco, such as cigarettes and e-cigarettes. If you need help quitting, ask your health care provider.  Limit alcohol intake to no more than 1 drink per day for nonpregnant women and 2 drinks per day for men. One drink equals 12 oz of beer, 5 oz of wine, or 1 oz of hard liquor.  Learn to manage stress. If you need help with this, ask your health care provider.  Keep all follow-up visits as told by your health care provider. This is important. Eating and drinking   Maintain a healthy weight.  Exercise regularly, as directed by your health care provider.  Stay hydrated, especially when you exercise, get sick, or spend time in hot temperatures.  Eat healthy foods, such as: ? Lean proteins. ? Complex carbohydrates. ? Fresh fruits and vegetables. ? Low-fat dairy products. ? Healthy fats.  Drink enough fluid to keep your urine clear or pale  yellow. If you have diabetes:  Make sure you know the symptoms of hyperglycemia.  Follow your diabetes management plan, as told by your health care provider. Make sure you: ? Take your insulin and medicines as directed. ? Follow your exercise plan. ? Follow your meal plan. Eat on time, and do not skip meals. ? Check your blood glucose as often as directed. Make sure to check your blood glucose before and after exercise. If you exercise longer or in a different way than usual, check your blood glucose more often. ? Follow your sick day plan whenever you cannot eat or drink normally. Make this plan in advance with your health care provider.  Share your diabetes management plan with people in your workplace, school, and household.  Check your urine for ketones when you are ill and as told by your health care provider.  Carry a medical alert card or wear medical alert jewelry. Contact a health care provider if:  Your blood glucose is at or above 240 mg/dL (13.3 mmol/L) for 2 days in a row.  You have problems keeping your blood glucose in your target range.  You have frequent episodes of hyperglycemia. Get help right away if:  You have difficulty breathing.  You have a change in how you think, feel, or act (mental status).  You have nausea or vomiting that does not go away. These symptoms may represent a serious problem that is an emergency. Do not wait to see if the symptoms will go away. Get medical help right away. Call your local emergency services (911 in the [place] Do not drive yourself to the hospital. Summary  Hyperglycemia occurs when the level of sugar (glucose) in the blood is too high.  Hyperglycemia is diagnosed with a blood test to measure your blood glucose level. This blood test is usually done while you are having symptoms. Your health care provider may also do a physical exam and review your medical history.  If you have diabetes, follow your diabetes management  plan as told by your health care provider.  Contact your health care provider if you have problems keeping your blood glucose in your target range. This information is not intended to replace advice given to you by your health care provider. Make sure you discuss any questions you have with your health care provider. Document Revised: 10/08/2015 Document Reviewed: 10/08/2015 Elsevier Patient Education  [place] With Diabetes  Foods with carbohydrates make your blood glucose level go up. Learning how to count carbohydrates can help you control your blood glucose levels. First, identify the foods you eat that contain carbohydrates. Then, using the Foods with Carbohydrates chart, determine about how much carbohydrates are in your meals and snacks. Make sure you are eating foods with fiber, protein, and healthy fat along with your carbohydrate foods. Foods with Carbohydrates The following table shows carbohydrate foods that have about 15 grams of carbohydrate each. Using measuring cups, spoons, or a food scale when you first begin learning about carbohydrate counting can help you learn about the portion sizes you typically eat. The following foods have 15 grams carbohydrate each:  Grains . 1 slice bread (1 ounce)  . 1 small tortilla (6-inch size)  .  large bagel (1 ounce)  . 1/3 cup pasta or rice (cooked)  .  hamburger or hot dog bun ( ounce)  .  cup cooked cereal  .  to  cup ready-to-eat cereal  . 2 taco shells (5-inch size) Fruit . 1 small fresh fruit ( to 1 cup)  .  medium banana  . 17 small grapes (3 ounces)  . 1 cup melon or berries  .  cup canned or frozen fruit  . 2 tablespoons dried fruit (blueberries, cherries, cranberries, raisins)  .  cup unsweetened fruit juice  Starchy Vegetables .  cup cooked beans, peas, corn, potatoes/sweet potatoes  .  large baked potato (3 ounces)  . 1 cup acorn or butternut squash  Snack  Foods . 3 to 6 crackers  . 8 potato chips or 13 tortilla chips ( ounce to 1 ounce)  . 3 cups popped popcorn  Dairy . 3/4 cup (6 ounces) nonfat plain yogurt, or yogurt with sugar-free sweetener  . 1 cup milk  . 1 cup plain rice, soy, coconut or flavored almond milk Sweets and Desserts .  cup ice cream or frozen yogurt  . 1 tablespoon jam, jelly, pancake syrup, table sugar, or honey  . 2 tablespoons light pancake syrup  . 1 inch square of frosted cake or 2 inch square of unfrosted cake  . 2 small cookies (2/3 ounce each) or  large cookie  Sometimes you'll have to estimate carbohydrate amounts if you don't know the exact recipe. One cup of mixed foods like soups can have 1 to 2 carbohydrate servings, while some casseroles might have 2 or more servings of carbohydrate. Foods that have less than 20 calories in each serving can be counted as "free" foods. Count 1 cup raw vegetables, or  cup cooked non-starchy vegetables as "free" foods. If you eat 3 or more servings at one meal, then count them as 1 carbohydrate serving.  Foods without Carbohydrates  Not all foods contain carbohydrates. Meat, some dairy, fats, non-starchy vegetables, and many beverages don't contain carbohydrate. So when you count carbohydrates, you can generally exclude chicken, pork, beef, fish, seafood, eggs, tofu, cheese, butter, sour cream, avocado, nuts, seeds, olives, mayonnaise, water, black coffee, unsweetened tea, and zero-calorie drinks. Vegetables with no or low carbohydrate include green beans, cauliflower, tomatoes,  and onions. How much carbohydrate should I eat at each meal?  Carbohydrate counting can help you plan your meals and manage your weight. Following are some starting points for carbohydrate intake at each meal. Work with your registered dietitian nutritionist to find the best range that works for your blood glucose and weight.   To Lose Weight To Maintain Weight  Women 2 - 3 carb servings 3 - 4 carb  servings  Men 3 - 4 carb servings 4 - 5 carb servings  Checking your blood glucose after meals will help you know if you need to adjust the timing, type, or number of carbohydrate servings in your meal plan. Achieve and keep a healthy body weight by balancing your food intake and physical activity.  Tips How should I plan my meals?  Plan for half the food on your plate to include non-starchy vegetables, like salad greens, broccoli, or carrots. Try to eat 3 to 5 servings of non-starchy vegetables every day. Have a protein food at each meal. Protein foods include chicken, fish, meat, eggs, or beans (note that beans contain carbohydrate). These two food groups (non-starchy vegetables and proteins) are low in carbohydrate. If you fill up your plate with these foods, you will eat less carbohydrate but still fill up your stomach. Try to limit your carbohydrate portion to  of the plate.  What fats are healthiest to eat?  Diabetes increases risk for heart disease. To help protect your heart, eat more healthy fats, such as olive oil, nuts, and avocado. Eat less saturated fats like butter, cream, and high-fat meats, like bacon and sausage. Avoid trans fats, which are in all foods that list "partially hydrogenated oil" as an ingredient. What should I drink?  Choose drinks that are not sweetened with sugar. The healthiest choices are water, carbonated or seltzer waters, and tea and coffee without added sugars.  Sweet drinks will make your blood glucose go up very quickly. One serving of soda or energy drink is  cup. It is best to drink these beverages only if your blood glucose is low.  Artificially sweetened, or diet drinks, typically do not increase your blood glucose if they have zero calories in them. Read labels of beverages, as some diet drinks do have carbohydrate and will raise your blood glucose. Label Reading Tips Read Nutrition Facts labels to find out how many grams of carbohydrate are in a food you  want to eat. Don't forget: sometimes serving sizes on the label aren't the same as how much food you are going to eat, so you may need to calculate how much carbohydrate is in the food you are serving yourself.   Carbohydrate Counting for People with Diabetes Sample 1-Day Menu  Breakfast  cup yogurt, low fat, low sugar (1 carbohydrate serving)   cup cereal, ready-to-eat, unsweetened (1 carbohydrate serving)  1 cup strawberries (1 carbohydrate serving)   cup almonds ( carbohydrate serving)  Lunch 1, 5 ounce can chunk light tuna  2 ounces cheese, low fat cheddar  6 whole wheat crackers (1 carbohydrate serving)  1 small apple (1 carbohydrate servings)   cup carrots ( carbohydrate serving)   cup snap peas  1 cup 1% milk (1 carbohydrate serving)   Evening Meal Stir fry made with: 3 ounces chicken  1 cup brown rice (3 carbohydrate servings)   cup broccoli ( carbohydrate serving)   cup green beans   cup onions  1 tablespoon olive oil  2 tablespoons teriyaki sauce ( carbohydrate  serving)  Evening Snack 1 extra small banana (1 carbohydrate serving)  1 tablespoon peanut butter   Carbohydrate Counting for People with Diabetes Vegan Sample 1-Day Menu  Breakfast 1 cup cooked oatmeal (2 carbohydrate servings)   cup blueberries (1 carbohydrate serving)  2 tablespoons flaxseeds  1 cup soymilk fortified with calcium and vitamin D  1 cup coffee  Lunch 2 slices whole wheat bread (2 carbohydrate servings)   cup baked tofu   cup lettuce  2 slices tomato  2 slices avocado   cup baby carrots ( carbohydrate serving)  1 orange (1 carbohydrate serving)  1 cup soymilk fortified with calcium and vitamin D   Evening Meal Burrito made with: 1 6-inch corn tortilla (1 carbohydrate serving)  1 cup refried vegetarian beans (2 carbohydrate servings)   cup chopped tomatoes   cup lettuce   cup salsa  1/3 cup brown rice (1 carbohydrate serving)  1 tablespoon olive oil for rice   cup  zucchini   Evening Snack 6 small whole grain crackers (1 carbohydrate serving)  2 apricots ( carbohydrate serving)   cup unsalted peanuts ( carbohydrate serving)    Carbohydrate Counting for People with Diabetes Vegetarian (Lacto-Ovo) Sample 1-Day Menu  Breakfast 1 cup cooked oatmeal (2 carbohydrate servings)   cup blueberries (1 carbohydrate serving)  2 tablespoons flaxseeds  1 egg  1 cup 1% milk (1 carbohydrate serving)  1 cup coffee  Lunch 2 slices whole wheat bread (2 carbohydrate servings)  2 ounces low-fat cheese   cup lettuce  2 slices tomato  2 slices avocado   cup baby carrots ( carbohydrate serving)  1 orange (1 carbohydrate serving)  1 cup unsweetened tea  Evening Meal Burrito made with: 1 6-inch corn tortilla (1 carbohydrate serving)   cup refried vegetarian beans (1 carbohydrate serving)   cup tomatoes   cup lettuce   cup salsa  1/3 cup brown rice (1 carbohydrate serving)  1 tablespoon olive oil for rice   cup zucchini  1 cup 1% milk (1 carbohydrate serving)  Evening Snack 6 small whole grain crackers (1 carbohydrate serving)  2 apricots ( carbohydrate serving)   cup unsalted peanuts ( carbohydrate serving)    Copyright 2020  Academy [employer]. All rights reserved.  Using Nutrition Labels: Carbohydrate  . Serving Size  . Look at the serving size. All the information on the label is based on this portion. Randol Kern Per Container  . The number of servings contained in the package. . Guidelines for Carbohydrate  . Look at the total grams of carbohydrate in the serving size.  . 1 carbohydrate choice = 15 grams of carbohydrate. Range of Carbohydrate Grams Per Choice  Carbohydrate Grams/Choice Carbohydrate Choices  6-10   11-20 1  21-25 1  26-35 2  36-40 2  41-50 3  51-55 3  56-65 4  66-70 4  71-80 5    Copyright 2020  Academy of [employer] Dietetics. All rights reserved.

## 2019-12-06 NOTE — Progress Notes (Signed)
Inpatient Diabetes Program Recommendations  AACE/ADA: New Consensus Statement on Inpatient Glycemic Control (2015)  Target Ranges:  Prepandial:   less than 140 mg/dL      Peak postprandial:   less than 180 mg/dL (1-2 hours)      Critically ill patients:  140 - 180 mg/dL   Lab Results  Component Value Date   GLUCAP 375 (H) 12/06/2019   HGBA1C 9.0 (H) 12/05/2019    Review of Glycemic Control Results for Ivan Jones, Ivan Jones (MRN [id number]) as of 12/06/2019 12:11  Ref. Range 12/05/2019 16:42 12/05/2019 21:59 12/06/2019 08:20 12/06/2019 11:48  Glucose-Capillary Latest Ref Range: 70 - 99 mg/dL 573 (H) 220 (H) 254 (H) 375 (H)   Diabetes history: Type 2 DM Outpatient Diabetes medications: Janumet 50-1000 mg BID Current orders for Inpatient glycemic control: Novolog 0-20 units TID, Novolog 0-5 units QHS, Novolog 10 units TID, Lantus 15 units BID Solumedrol 1000 mg QD  Inpatient Diabetes Program Recommendations:  :    Noted insulin adjustment today. May want to further increase Lantus to 20 units BID and Novolog 14 units TID (assuming patient is consuming >50% of meals).   Thanks, Lujean Rave, MSN, RNC-OB Diabetes Coordinator [phone] (8a-5p)    Blood glucose meter (includes lancets and strips) (#[id number]).

## 2019-12-07 LAB — GLUCOSE, CAPILLARY
Glucose-Capillary: 268 mg/dL — ABNORMAL HIGH (ref 70–99)
Glucose-Capillary: 294 mg/dL — ABNORMAL HIGH (ref 70–99)
Glucose-Capillary: 256 mg/dL — ABNORMAL HIGH (ref 70–99)
Glucose-Capillary: 465 mg/dL — ABNORMAL HIGH (ref 70–99)

## 2019-12-07 LAB — HEPATIC FUNCTION PANEL
ALT: 128 U/L — ABNORMAL HIGH (ref 0–44)
AST: 67 U/L — ABNORMAL HIGH (ref 15–41)
Albumin: 3.9 g/dL (ref 3.5–5.0)
Alkaline Phosphatase: 80 U/L (ref 38–126)
Bilirubin, Direct: 0.4 mg/dL — ABNORMAL HIGH (ref 0.0–0.2)
Indirect Bilirubin: 1 mg/dL — ABNORMAL HIGH (ref 0.3–0.9)
Total Bilirubin: 1.4 mg/dL — ABNORMAL HIGH (ref 0.3–1.2)
Total Protein: 7.4 g/dL (ref 6.5–8.1)

## 2019-12-07 LAB — HSV CULTURE AND TYPING

## 2019-12-07 LAB — T4, FREE: Free T4: 1.13 ng/dL — ABNORMAL HIGH (ref 0.61–1.12)

## 2019-12-07 MED ORDER — ALPRAZOLAM 0.25 MG PO TABS
0.2500 mg | ORAL_TABLET | Freq: Two times a day (BID) | ORAL | Status: DC | PRN
  Administered 2019-12-07 – 2019-12-08 (×2): 0.25 mg via ORAL
  Filled 2019-12-07 (×2): qty 1

## 2019-12-07 MED ORDER — INSULIN ASPART 100 UNIT/ML ~~LOC~~ SOLN
15.0000 [IU] | Freq: Three times a day (TID) | SUBCUTANEOUS | Status: DC
  Administered 2019-12-07 – 2019-12-08 (×4): 15 [IU] via SUBCUTANEOUS

## 2019-12-07 MED ORDER — INSULIN GLARGINE 100 UNIT/ML ~~LOC~~ SOLN
20.0000 [IU] | Freq: Two times a day (BID) | SUBCUTANEOUS | Status: DC
  Administered 2019-12-07 – 2019-12-08 (×3): 20 [IU] via SUBCUTANEOUS
  Filled 2019-12-07 (×4): qty 0.2

## 2019-12-07 MED ORDER — INSULIN ASPART 100 UNIT/ML ~~LOC~~ SOLN
0.0000 [IU] | Freq: Three times a day (TID) | SUBCUTANEOUS | Status: DC
  Administered 2019-12-07 (×2): 11 [IU] via SUBCUTANEOUS
  Administered 2019-12-08: 7 [IU] via SUBCUTANEOUS
  Administered 2019-12-08: 11 [IU] via SUBCUTANEOUS
  Administered 2019-12-09: 3 [IU] via SUBCUTANEOUS

## 2019-12-07 MED ORDER — INSULIN ASPART 100 UNIT/ML ~~LOC~~ SOLN: 15.0000 [IU] | Freq: Three times a day (TID) | SUBCUTANEOUS | Status: DC

## 2019-12-07 NOTE — Progress Notes (Signed)
Report given to Shanda Bumps, Charity fundraiser. Patient is alert and oriented x4 sitting up in bed with call light in reach. VSS. No pain noted. Plan is to continue to with IV Solumedrol. LP results to be discussed with patient by neuro. No labs this AM. Continue to monitor blood sugars.

## 2019-12-07 NOTE — Progress Notes (Signed)
NEUROLOGY PROGRESS NOTE  Patient JF:HLKTG Skeenis a 44 y.o.with PMHx of has a past medical history of Arthritis, Complication of anesthesia, Diabetes mellitus without complication (HCC), History of kidney stones, and Hypertension, who presented on 10/29 to [facility] w/ retrosternal chest pain radiating to the left shoulder, felt to be an anxiety attack, followed by bilateral hand weakness / numbness on 10/30 (sudden onset). Started on pulse dose steroids 10/31, planned to complete 11/04   Subjective: Patient feels good. Feels his grips are becoming stronger  Exam:     Vitals:   12/05/19 2100 12/06/19 0535  BP: 137/84 122/87  Pulse: 81 75  Resp: 15 16  Temp: 98.4 F (36.9 C) 98.3 F (36.8 C)  SpO2: 98% 99%    ROS General ROS: negative for - chills, fatigue, fever, night sweats, weight gain or weight loss Psychological ROS: negative for - behavioral disorder, hallucinations, memory difficulties, mood swings or suicidal ideation Ophthalmic ROS: negative for - blurry vision, double vision, eye pain or loss of vision ENT ROS: negative for - epistaxis, nasal discharge, oral lesions, sore throat, tinnitus or vertigo Respiratory ROS: negative for - cough, hemoptysis, shortness of breath or wheezing Cardiovascular ROS: negative for - chest pain, dyspnea on exertion, edema or irregular heartbeat Gastrointestinal ROS: negative for - abdominal pain, diarrhea, hematemesis, nausea/vomiting or stool incontinence Genito-Urinary ROS: negative for - dysuria, hematuria, incontinence or urinary frequency/urgency Musculoskeletal ROS: Positive for - muscular weakness Neurological ROS: as noted in HPI Dermatological ROS: negative for rash and skin lesion changes   Physical Exam Constitutional: Appears well-developed and well-nourished.  Psych: Affect appropriate to situation Eyes: No scleral injection HENT: No OP obstrucion Head: Normocephalic.  Cardiovascular: Normal rate and  regular rhythm.  Respiratory: Effort normal, non-labored breathing GI: Soft. No distension. There is no tenderness.  Skin: WDI   Neuro: Mental Status: Alert, oriented, thought content appropriate.  Speech fluent without evidence of aphasia.  Able to follow 3 step commands without difficulty. Cranial Nerves: II:  Visual fields grossly normal,  III,IV, VI: ptosis not present, extra-ocular motions intact bilaterally pupils equal, round, reactive to light and accommodation V,VII: smile symmetric, facial light touch sensation normal bilaterally VIII: hearing normal bilaterally XI: bilateral shoulder shrug XII: midline tongue extension Motor: Right :Upper extremityLeft: Upper extremity 5/5 deltoid5/5 deltoid 5/5 tricep5/5 tricep 5/5 biceps5/5 biceps  4/5wrist flexion5/5 wrist flexion 5/5 wrist extension5/5 wrist extension 4/100finger ext4/57finger ext 4/5 finger flexion4/5 finer flext Lower extremityLower extremity 5/5 hip flexor5/5 hip flexor 5/5 hip adductors5/5 hip adductors 5/5 hip abductors5/5 hip abductors 5/5 quadricep5/5 quadriceps  5/5  hamstrings5/5 hamstrings Tone and bulk:normal tone throughout; no atrophy noted Sensory: States that bilateral hands still have tingling sensation with left greater than right.  Right/left forearmcold temperature intact however, on his hand palm is greater warmth sensation then dorsal.  Medications:  Scheduled: . aspirin EC  325 mg Oral BID  . heparin  5,000 Units Subcutaneous Q8H  . insulin aspart  0-20 Units Subcutaneous TID WC  . insulin aspart  0-5 Units Subcutaneous QHS  . insulin aspart  7 Units Subcutaneous TID WC  . insulin glargine  10 Units Subcutaneous BID  . lidocaine (PF)  5 mL Intradermal Once  . losartan  100 mg Oral Daily  . meloxicam  15 mg Oral Daily    Pertinent Labs/Diagnostics: Results for LEONIDUS, ROWAND (MRN [id number]) as of 12/06/2019 10:21  Ref. Range 12/05/2019 09:03  Appearance, CSF Latest Ref Range: CLEAR  CLEAR (A)  Glucose, CSF Latest  Ref Range: 40 - 70 mg/dL 846 (H)  RBC Count, CSF Latest Ref Range: 0 /cu mm 0  WBC, CSF Latest Ref Range: 0 - 5 /cu mm 3  Other Cells, CSF Unknown TOO FEW TO COUNT, SMEAR AVAILABLE FOR REVIEW  Color, CSF Latest Ref Range: COLORLESS  COLORLESS  Supernatant Unknown NOT INDICATED  Total  Protein, CSF Latest Ref Range: 15 - 45 mg/dL 40  Tube # Unknown 3  PENDING CSF LABS Pending labs: -Lumbar puncture with cytology including: culture-negative oligoclonal bands-pending AQP4 antibody-pending enterovirus PCR-pending cytomegalovirus PCR-pending Epstein-Barr virus PCR-pending herpes virus PCR-pending varicella-zoster virus PCR-pending IgG index-pending [facility]-pending VRDL-neg  - Serumaquaporin-4 antibodies(AQP4), anti-myelin oligodendrocyte glycoprotein antibodies(MOG), arbovirus panel including West Nile virus antibodies -All pending   Imaging Results (Last 48 hours)  DG Fluoro Rm 1-60 Min - No Report  Result Date:  12/04/2019 CLINICAL DATA:  Request for lumbar puncture under fluoroscopic guidance. EXAM: DIAGNOSTIC LUMBAR PUNCTURE UNDER FLUOROSCOPIC GUIDANCE FLUOROSCOPY TIME:  Fluoroscopy Time: Not recorded (fluoroscopy unit malfunction) Radiation Exposure Index (if provided by the fluoroscopic device): None applicable Number of Acquired Spot Images: 0 PROCEDURE: Informed consent was obtained from the patient prior to the procedure, including potential complications of headache, allergy, and pain. With the patient prone, the lower back was prepped with Betadine. 1% Lidocaine was used for local anesthesia. Lumbar puncture was performed at the L3-4 and L2-3 levels using a 21 gauge needle with return of no fluid. The table malfunctioned during the procedure and procedure was terminated prior to obtaining fluid. IMPRESSION: Attempted lumbar puncture under fluoroscopic guidance. Fluid was not obtained for analysis. Procedure will be rescheduled for 12/05/2019. Electronically Signed   By: Norva Pavlov M.D.   On: 12/04/2019 14:40   DG FL GUIDED LUMBAR PUNCTURE  Result Date: 12/05/2019 CLINICAL DATA:  Signal abnormalities in the cervical cord, query transverse myelitis or demyelinating process EXAM: DIAGNOSTIC LUMBAR PUNCTURE UNDER FLUOROSCOPIC GUIDANCE FLUOROSCOPY TIME:  Fluoroscopy Time:  0 minutes, 24 seconds Radiation Exposure Index (if provided by the fluoroscopic device): 5.4 mGy Number of Acquired Spot Images: 0 PROCEDURE: I discussed the risks (including hemorrhage, infection, and nerve damage, among others), benefits, and alternatives to fluoroscopically guided lumbar puncture with the patient. We specifically discussed the high likelihood of technical success of the procedure. The patient understood and elected to undergo the procedure. Standard time-out was employed. Following sterile skin prep and local anesthetic administration consisting of 1 percent lidocaine, a 5 inch 20 gauge spinal needle was advanced  without difficulty into the thecal sac at the L5-S1 level under fluoroscopic guidance. Clear CSF was returned. Opening pressure was 3 cm of water. A total of 12 cc of clear CSF was collected in 4 vials. The needle was subsequently removed and the skin cleansed and bandaged. No immediate complications were observed. IMPRESSION: 1. Technically successful fluoroscopically guided lumbar puncture at the L5-S1 level, yielding 12 cc of clear CSF. Electronically Signed   By: Gaylyn Rong M.D.   On: 12/05/2019 10:14      Auston Halfmann PA-C [facility] [phone]  Assessment:  Tori Milks a 44 y.o.malewith acute rapidly progressing paraparesisand paresthesia with clearly defined sensory levelat C3 without autonomic involvement.MRI showed multifocal cervical cord lesions and a few with borderline enhancement.The patient meets criteria for transverse myelitis withacute flaccid paralysisbeing less likelyand will need further emergent diagnostic workup forinfectious([place] and [place] had recent tick bite), CNS demyelinating, systemic inflammatory, autoimmune and possibleparaneoplastic disorders  and emergentevaluation andtreatment  Patient has received 4 doses Solu-Medrol. Feels as though he is still gaining strength already with his grip on his right hand. LP was successful today and labs pending   Impression: This is a 44 year old gentleman with transverse myelitis improving on pulse dose steroids.  Lumbar puncture is reassuring against infection, as is his improvement with his steroids.  Recommendations: -Continue Solu-Medrol 1000 mg/day for 5 doses today will be dose4 -We will need continued physical therapy as outpatient --did not PT in hospital and ordered this for tomorrow -Will need follow-up with outpatient neurologist-[facility]

## 2019-12-07 NOTE — Progress Notes (Signed)
Patient complained of tingling in bilateral feet and thighs.  Dr. Iver Nestle notified. MD stated to continue to monitor.

## 2019-12-07 NOTE — Progress Notes (Signed)
PROGRESS NOTE    Ivan Jones  ACZ:[id number] DOB: [birth year] DOA: 12/03/2019 PCP: Marylen Ponto, MD   Chief Complaint  Patient presents with  . Hand Problem    Brief Narrative: 44 year old male with T2DM, HTN presented with bilateral hand numbness started October 30/2021 at noon, with weakness in the grip could not hold and drink up on the car door. He was seen in the ED MRI brain and thoracic spine normal, MRI cervical spine showed no discitis or osteomyelitis but did show multifocal cord lesions with possible mild enhancement at C5-C6 with transverse myelitis and demyelinating disease on the differential.  Seen by neurology LP unsuccessful in 10/31, underwent LP 11/1 under fluoroscopy. Patient placed on high-dose Solu-Medrol 1000 mg daily x5 days  Subjective: No acute events overnight.  Blood sugar continues to stay high Right arm grip is getting stronger, left arm is still weak.  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  Underwent LP negative for infectious etiology, await for further LP labs per neurology. Neurology following closely.  Appreciate input.   Continue PT OT.  Type 2 diabetes mellitus without complication,Hemoglobin A1c is uncontrolled at 9.0: Uncontrolled hyperglycemia in the setting of a high dose steroid.  Holding home Janumet for now.  Total insulin aspart received 87 units- so will increase Lantus to 20 units BID, and Premeal insulin to 15 TID , keep on ssi. Recent Labs  Lab 12/05/19 2159 12/06/19 0820 12/06/19 1148 12/06/19 1632 12/06/19 2258  GLUCAP 316* 375* 375* 328* 487*   Transaminitis: 2/2 ?overweight, trend, if not coming down check RUQ [place].  ALT down from 151>128,AST 67, total bili 1.4  Low TSH 0.2- check FT4- IT IS BORDERLINE  high 1.13.  He has no tachycardia in fact bradycardic in 50s at times, ?  Hyperthyroidism- will need follow-up thyroid function test and also follow-up with PCP or endocrinology upon discharge   Essential hypertension: BP is fairly controlled on losartan.   Leukocytosis in the setting of steroid.  Patient is afebrile.  Diet Order            Diet Carb Modified Fluid consistency: Thin; Room service appropriate? Yes  Diet effective now                Morbi obesity with BMI Body mass index is 34.89 kg/m.  Patient will benefit with weight loss and PCP follow-up.    DVT prophylaxis: heparin injection 5,000 Units Start: 12/04/19 0630 SCDs Start: 12/04/19 0618 Code Status:   Code Status: Full Code  Family Communication: plan of care discussed with patient and his wife at bedside.  Status is: Inpatient Remains inpatient appropriate because:Ongoing diagnostic testing needed not appropriate for outpatient work up and Inpatient level of care appropriate due to severity of illness  Dispo: The patient is from: Home              Anticipated d/c is to: Home              Anticipated d/c date is: 1-2 days once cleared by neurology.              Patient currently is not medically stable to d/c.  Consultants:see note  Procedures:see note  Culture/Microbiology    Component Value Date/Time   SDES CYTO CSF 12/05/2019 0903   SDES CYTO  CSF 12/05/2019 0903   SPECREQUEST NONE 12/05/2019 0903   SPECREQUEST NONE 12/05/2019 0903   CULT PENDING 12/05/2019 0903   CULT  12/05/2019 0903    NO GROWTH 2 DAYS Performed at [facility], [address] [place], [place] [id number]    REPTSTATUS PENDING 12/05/2019 [id number]   REPTSTATUS PENDING 12/05/2019 [id number]    Other culture-see note  Medications: Scheduled Meds: . aspirin EC  325 mg Oral BID  . heparin  5,000 Units Subcutaneous Q8H  . insulin aspart  0-20 Units Subcutaneous TID WC  . insulin aspart  0-5 Units Subcutaneous QHS  . insulin aspart  10  Units Subcutaneous TID WC  . insulin glargine  15 Units Subcutaneous BID  . lidocaine (PF)  5 mL Intradermal Once  . losartan  100 mg Oral Daily  . meloxicam  15 mg Oral Daily   Continuous Infusions: . methylPREDNISolone (SOLU-MEDROL) injection Stopped (12/07/19 0739)    Antimicrobials: Anti-infectives (From admission, onward)   None     Objective: Vitals: Today's Vitals   12/06/19 1409 12/06/19 2100 12/06/19 2343 12/07/19 0600  BP: 134/74 (!) 150/90  121/79  Pulse: 82 (!) 58  63  Resp: 16 18  17   Temp: 98.7 F (37.1 C) 98.2 F (36.8 C)  98.7 F (37.1 C)  TempSrc: Oral Oral  Oral  SpO2: 95% 97% 96% 97%  Weight:    110.3 kg  Height:      PainSc:   0-No pain     Intake/Output Summary (Last 24 hours) at 12/07/2019 0822 Last data filed at 12/07/2019 0739 Gross per 24 hour  Intake 410 ml  Output 500 ml  Net -90 ml   Filed Weights   12/04/19 1435 12/06/19 0500 12/07/19 0600  Weight: 112.3 kg 111.5 kg 110.3 kg   Weight change: -1.2 kg  Intake/Output from previous day: 11/02 0701 - 11/03 0700 In: 360 [P.O.:360] Out: 500 [Urine:500] Intake/Output this shift: Total I/O In: 50 [IV Piggyback:50] Out: -   Examination: General exam: AAOx3,NAD, weak appearing. HEENT:Oral mucosa moist, Ear/Nose WNL grossly, dentition normal. Respiratory system: bilaterally clear,no wheezing or crackles,no use of accessory muscle. Cardiovascular system: S1 & S2 +,No JVD. Gastrointestinal system: Abdomen soft, NT,ND, BS+ Nervous System: Right arm with good grip left arm still weak alert, awake, moving extremities and grossly nonfocal Extremities: No edema, distal peripheral pulses palpable.  Skin: No rashes,no icterus. MSK: Normal muscle bulk,tone, power  Data Reviewed: I have personally reviewed following labs and imaging studies CBC: Recent Labs  Lab 12/03/19 1622 12/05/19 1142  WBC 13.2* 22.6*  NEUTROABS  --  21.2*  HGB 14.4 15.0  HCT 42.7 44.7  MCV 91.6 92.0  PLT 254 281    Basic Metabolic Panel: Recent Labs  Lab 12/03/19 1622 12/05/19 1142  NA 140 136  K 4.3 4.1  CL 104 100  CO2 24 26  GLUCOSE 262* 430*  BUN 13 29*  CREATININE 1.16 1.18  CALCIUM 9.7 10.2  MG  --  2.2   GFR: Estimated Creatinine Clearance: 99.3 mL/min (by C-G formula based on SCr of 1.18 mg/dL). Liver Function Tests: Recent Labs  Lab 12/05/19 1142  AST 58*  ALT 151*  ALKPHOS 71  BILITOT 1.0  PROT 7.6  ALBUMIN 4.0   No results for input(s): LIPASE, AMYLASE in the last 168 hours. No results for input(s): AMMONIA in the last 168 hours. Coagulation Profile: No results for input(s): INR, PROTIME in the last 168 hours. Cardiac Enzymes: No  results for input(s): CKTOTAL, CKMB, CKMBINDEX, TROPONINI in the last 168 hours. BNP (last 3 results) No results for input(s): PROBNP in the last 8760 hours. HbA1C: Recent Labs    12/05/19 1142  HGBA1C 9.0*   CBG: Recent Labs  Lab 12/05/19 2159 12/06/19 0820 12/06/19 1148 12/06/19 1632 12/06/19 2258  GLUCAP 316* 375* 375* 328* 487*   Lipid Profile: No results for input(s): CHOL, HDL, LDLCALC, TRIG, CHOLHDL, LDLDIRECT in the last 72 hours. Thyroid Function Tests: Recent Labs    12/05/19 1142  TSH 0.264*   Anemia Panel: No results for input(s): VITAMINB12, FOLATE, FERRITIN, TIBC, IRON, RETICCTPCT in the last 72 hours. Sepsis Labs: No results for input(s): PROCALCITON, LATICACIDVEN in the last 168 hours.  Recent Results (from the past 240 hour(s))  Respiratory Panel by RT PCR (Flu A&B, Covid) - Nasopharyngeal Swab     Status: None   Collection Time: 12/04/19  6:17 AM   Specimen: Nasopharyngeal Swab  Result Value Ref Range Status   SARS Coronavirus 2 by RT PCR NEGATIVE NEGATIVE Final    Comment: (NOTE) SARS-CoV-2 target nucleic acids are NOT DETECTED.  The SARS-CoV-2 RNA is generally detectable in upper respiratoy specimens during the acute phase of infection. The lowest concentration of SARS-CoV-2 viral copies this  assay can detect is 131 copies/mL. A negative result does not preclude SARS-Cov-2 infection and should not be used as the sole basis for treatment or other patient management decisions. A negative result may occur with  improper specimen collection/handling, submission of specimen other than nasopharyngeal swab, presence of viral mutation(s) within the areas targeted by this assay, and inadequate number of viral copies (<131 copies/mL). A negative result must be combined with clinical observations, patient history, and epidemiological information. The expected result is Negative.  Fact Sheet for Patients:  https://www.moore.com/  Fact Sheet for Healthcare Providers:  https://www.young.biz/  This test is no t yet approved or cleared by the [place] FDA and  has been authorized for detection and/or diagnosis of SARS-CoV-2 by FDA under an Emergency Use Authorization (EUA). This EUA will remain  in effect (meaning this test can be used) for the duration of the COVID-19 declaration under Section 564(b)(1) of the Act, 21 U.S.C. section 360bbb-3(b)(1), unless the authorization is terminated or revoked sooner.     Influenza A by PCR NEGATIVE NEGATIVE Final   Influenza B by PCR NEGATIVE NEGATIVE Final    Comment: (NOTE) The Xpert Xpress SARS-CoV-2/FLU/RSV assay is intended as an aid in  the diagnosis of influenza from Nasopharyngeal swab specimens and  should not be used as a sole basis for treatment. Nasal washings and  aspirates are unacceptable for Xpert Xpress SARS-CoV-2/FLU/RSV  testing.  Fact Sheet for Patients: https://www.moore.com/  Fact Sheet for Healthcare Providers: https://www.young.biz/  This test is not yet approved or cleared by the [place] FDA and  has been authorized for detection and/or diagnosis of SARS-CoV-2 by  FDA under an Emergency Use Authorization (EUA). This EUA will  remain  in effect (meaning this test can be used) for the duration of the  Covid-19 declaration under Section 564(b)(1) of the Act, 21  U.S.C. section 360bbb-3(b)(1), unless the authorization is  terminated or revoked. Performed at [facility], [address], [place], [place] [address]   Anaerobic culture     Status: None (Preliminary result)   Collection Time: 12/05/19  9:03 AM   Specimen: PATH Cytology CSF; Cerebrospinal Fluid  Result Value Ref Range Status   Specimen Description  CYTO CSF  Final   Special Requests NONE  Final   Gram Stain   Final    NO WBC SEEN NO ORGANISMS SEEN Performed at [facility], [address], [place], [place] [address]    Culture PENDING  Incomplete   Report Status PENDING  Incomplete  CSF culture     Status: None (Preliminary result)   Collection Time: 12/05/19  9:03 AM   Specimen: PATH Cytology CSF; Cerebrospinal Fluid  Result Value Ref Range Status   Specimen Description CYTO CSF  Final   Special Requests NONE  Final   Gram Stain   Final    WBC PRESENT, PREDOMINANTLY MONONUCLEAR NO ORGANISMS SEEN CYTOSPIN SMEAR    Culture   Final    NO GROWTH 2 DAYS Performed at [facility], [address] [place], [place] [address]    Report Status PENDING  Incomplete     Radiology Studies: DG FL GUIDED LUMBAR PUNCTURE  Result Date: 12/05/2019 CLINICAL DATA:  Signal abnormalities in the cervical cord, query transverse myelitis or demyelinating process EXAM: DIAGNOSTIC LUMBAR PUNCTURE UNDER FLUOROSCOPIC GUIDANCE FLUOROSCOPY TIME:  Fluoroscopy Time:  0 minutes, 24 seconds Radiation Exposure Index (if provided by the fluoroscopic device): 5.4 mGy Number of Acquired Spot Images: 0 PROCEDURE: I discussed the risks (including hemorrhage, infection, and nerve damage, among others), benefits, and alternatives to fluoroscopically guided lumbar puncture with the patient. We specifically discussed the high likelihood of technical success of the  procedure. The patient understood and elected to undergo the procedure. Standard time-out was employed. Following sterile skin prep and local anesthetic administration consisting of 1 percent lidocaine, a 5 inch 20 gauge spinal needle was advanced without difficulty into the thecal sac at the L5-S1 level under fluoroscopic guidance. Clear CSF was returned. Opening pressure was 3 cm of water. A total of 12 cc of clear CSF was collected in 4 vials. The needle was subsequently removed and the skin cleansed and bandaged. No immediate complications were observed. IMPRESSION: 1. Technically successful fluoroscopically guided lumbar puncture at the L5-S1 level, yielding 12 cc of clear CSF. Electronically Signed   By: Gaylyn Rong M.D.   On: 12/05/2019 10:14     LOS: 3 days   Lanae Boast, MD [facility]  12/07/2019, 8:22 AM

## 2019-12-07 NOTE — Progress Notes (Addendum)
Inpatient Diabetes Program Recommendations  AACE/ADA: New Consensus Statement on Inpatient Glycemic Control (2015)  Target Ranges:  Prepandial:   less than 140 mg/dL      Peak postprandial:   less than 180 mg/dL (1-2 hours)      Critically ill patients:  140 - 180 mg/dL   Lab Results  Component Value Date   GLUCAP 268 (H) 12/07/2019   HGBA1C 9.0 (H) 12/05/2019    Review of Glycemic Control Results for Ivan Jones, Ivan Jones (MRN [id number]) as of 12/07/2019 10:42  Ref. Range 12/06/2019 11:48 12/06/2019 16:32 12/06/2019 22:58 12/07/2019 07:59  Glucose-Capillary Latest Ref Range: 70 - 99 mg/dL 476 (H) 546 (H) 503 (H) 268 (H)   Diabetes history:Type 2 DM Outpatient Diabetes medications:Janumet 50-1000 mg BID Current orders for Inpatient glycemic control:Novolog 0-20 units TID, Novolog 0-5 units QHS, Novolog 15 units TID, Lantus 20 units BID Solumedrol 1000 mg QD  Inpatient Diabetes Program Recommendations::    Noted insulin adjustment today. Continue to further increase Lantus to 28 units BID and Novolog 18 units TID (assuming patient is consuming >50% of meals).   Thanks, Lujean Rave, MSN, RNC-OB Diabetes Coordinator [phone] (8a-5p)

## 2019-12-08 LAB — CSF CULTURE W GRAM STAIN: Culture: NO GROWTH

## 2019-12-08 LAB — COMPREHENSIVE METABOLIC PANEL
ALT: 140 U/L — ABNORMAL HIGH (ref 0–44)
AST: 56 U/L — ABNORMAL HIGH (ref 15–41)
Albumin: 3.6 g/dL (ref 3.5–5.0)
Alkaline Phosphatase: 76 U/L (ref 38–126)
Anion gap: 9 (ref 5–15)
BUN: 37 mg/dL — ABNORMAL HIGH (ref 6–20)
CO2: 24 mmol/L (ref 22–32)
Calcium: 9.9 mg/dL (ref 8.9–10.3)
Chloride: 105 mmol/L (ref 98–111)
Creatinine, Ser: 1.28 mg/dL — ABNORMAL HIGH (ref 0.61–1.24)
GFR, Estimated: >60 mL/min (ref >60)
Glucose, Bld: 289 mg/dL — ABNORMAL HIGH (ref 70–99)
Potassium: 4.3 mmol/L (ref 3.5–5.1)
Sodium: 138 mmol/L (ref 135–145)
Total Bilirubin: 0.8 mg/dL (ref 0.3–1.2)
Total Protein: 6.9 g/dL (ref 6.5–8.1)

## 2019-12-08 LAB — CBC
HCT: 42.8 % (ref 39.0–52.0)
Hemoglobin: 14.5 g/dL (ref 13.0–17.0)
MCH: 31.3 pg (ref 26.0–34.0)
MCHC: 33.9 g/dL (ref 30.0–36.0)
MCV: 92.4 fL (ref 80.0–100.0)
Platelets: 274 10*3/uL (ref 150–400)
RBC: 4.63 MIL/uL (ref 4.22–5.81)
RDW: 12.5 % (ref 11.5–15.5)
WBC: 22 10*3/uL — ABNORMAL HIGH (ref 4.0–10.5)
nRBC: 0 % (ref 0.0–0.2)

## 2019-12-08 LAB — GLUCOSE, CAPILLARY
Glucose-Capillary: 241 mg/dL — ABNORMAL HIGH (ref 70–99)
Glucose-Capillary: 406 mg/dL — ABNORMAL HIGH (ref 70–99)
Glucose-Capillary: 268 mg/dL — ABNORMAL HIGH (ref 70–99)
Glucose-Capillary: 119 mg/dL — ABNORMAL HIGH (ref 70–99)

## 2019-12-08 LAB — HSV(HERPES SMPLX VRS)ABS-I+II(IGG)-CSF: HSV Type I/II Ab, IgG CSF: 0.64 IV (ref <=0.89)

## 2019-12-08 LAB — OLIGOCLONAL BANDS, CSF + SERM

## 2019-12-08 MED ORDER — INSULIN GLARGINE 100 UNIT/ML ~~LOC~~ SOLN
28.0000 [IU] | Freq: Two times a day (BID) | SUBCUTANEOUS | Status: DC
  Administered 2019-12-08: 28 [IU] via SUBCUTANEOUS
  Filled 2019-12-08 (×3): qty 0.28

## 2019-12-08 MED ORDER — SODIUM CHLORIDE 0.9 % IV SOLN: INTRAVENOUS | Status: AC

## 2019-12-08 MED ORDER — INSULIN ASPART 100 UNIT/ML ~~LOC~~ SOLN
18.0000 [IU] | Freq: Three times a day (TID) | SUBCUTANEOUS | Status: DC
  Administered 2019-12-08 – 2019-12-09 (×2): 18 [IU] via SUBCUTANEOUS

## 2019-12-08 MED ORDER — INSULIN ASPART 100 UNIT/ML ~~LOC~~ SOLN
20.0000 [IU] | Freq: Once | SUBCUTANEOUS | Status: AC
  Administered 2019-12-08: 20 [IU] via SUBCUTANEOUS

## 2019-12-08 NOTE — Progress Notes (Addendum)
Inpatient Diabetes Program Recommendations  AACE/ADA: New Consensus Statement on Inpatient Glycemic Control (2015)  Target Ranges:  Prepandial:   less than 140 mg/dL      Peak postprandial:   less than 180 mg/dL (1-2 hours)      Critically ill patients:  140 - 180 mg/dL   Lab Results  Component Value Date   GLUCAP 241 (H) 12/08/2019   HGBA1C 9.0 (H) 12/05/2019    Review of Glycemic Control Results for FERGUSON, GERTNER (MRN [id number]) as of 12/08/2019 09:30  Ref. Range 12/07/2019 12:16 12/07/2019 16:51 12/07/2019 20:58 12/08/2019 08:20  Glucose-Capillary Latest Ref Range: 70 - 99 mg/dL 675 (H) 449 (H) 201 (H) 241 (H)   Diabetes history:Type 2 DM Outpatient Diabetes medications:Janumet 50-1000 mg BID Current orders for Inpatient glycemic control:Novolog 0-20 units TID, Novolog 0-5 units QHS, Novolog 15 units TID, Lantus 20 units BID Solumedrol 1000 mg QD (today is last dose), then taper to orals x 5 days  Inpatient Diabetes Program Recommendations::   Continue to further increase Lantus to 28 units BID and Novolog 18 units TID (assuming patient is consuming >50% of meals).   Thanks, Lujean Rave, MSN, RNC-OB Diabetes Coordinator [phone] (8a-5p)

## 2019-12-08 NOTE — Evaluation (Addendum)
Occupational Therapy Evaluation Patient Details Name: Ivan Jones MRN: [id number] DOB: [birth year] Today's Date: 12/08/2019    History of Present Illness Pt adm with bilateral hand numbness and weakness. Pt found to have transverse myelitis. Pt began treatment with steroids. PMH - DM, HTN, arthritis, Rt TKR, recent panic attack.    Clinical Impression   PTA pt living with family and functioning at independent community level. At time of eval, pt able to complete bed mobility and transfers independently. Noted most deficits in bil hand strength (L>R). L hand presents with decreased extensor strength, weak grasp, and limited finger isolated movement. Pt is not able to use LUE functionally at independent level. RUE also presents with generalized weakness in grip strength, but pt is able to use hand functionally for self feeding and grooming with increased time. Recommend pt follow up with OP OT for progressive strengthening of bil hands to return to prior independent level. Will continue to follow per POC listed below.    Follow Up Recommendations  Outpatient OT (neuro)    Equipment Recommendations  None recommended by OT    Recommendations for Other Services       Precautions / Restrictions Precautions Precautions: None Restrictions Weight Bearing Restrictions: No      Mobility Bed Mobility Overal bed mobility: Independent                  Transfers Overall transfer level: Modified independent Equipment used: None                  Balance Overall balance assessment: Independent                                     ADL either performed or assessed with clinical judgement   ADL Overall ADL's : Needs assistance/impaired Eating/Feeding: Set up;Sitting   Grooming: Set up;Sitting   Upper Body Bathing: Minimal assistance;Sitting   Lower Body Bathing: Set up;Sitting/lateral leans;Sit to/from stand   Upper Body Dressing : Minimal  assistance;Sitting Upper Body Dressing Details (indicate cue type and reason): for fasteners Lower Body Dressing: Minimal assistance;Sitting/lateral leans;Sit to/from stand   Toilet Transfer: Supervision/safety;Ambulation;Regular Social worker and Hygiene: Independent       Functional mobility during ADLs: Supervision/safety;Cueing for safety       Vision Patient Visual Report: No change from baseline       Perception     Praxis      Pertinent Vitals/Pain Pain Assessment: Faces Faces Pain Scale: Hurts a little bit Pain Location: uncomfortable tingling Pain Descriptors / Indicators: Tingling Pain Intervention(s): Monitored during session     Hand Dominance Right   Extremity/Trunk Assessment Upper Extremity Assessment Upper Extremity Assessment: LUE deficits/detail;RUE deficits/detail RUE Deficits / Details: decreased grip strength, but able to use dominant extremity functionally. Able to feed self and isolate some finger movements. RUE Coordination: decreased fine motor;decreased gross motor LUE Deficits / Details: decreased grip strength (L>R)- mostly weak in extensors. Limited ability to oppose digits or isolate finger movements LUE Coordination: decreased gross motor;decreased fine motor   Lower Extremity Assessment Lower Extremity Assessment: Overall WFL for tasks assessed;Defer to PT evaluation       Communication Communication Communication: No difficulties   Cognition Arousal/Alertness: Awake/alert Behavior During Therapy: WFL for tasks assessed/performed Overall Cognitive Status: Within Functional Limits for tasks assessed  General Comments       Exercises   Shoulder Instructions      Home Living Family/patient expects to be discharged to:: Other (Comment) (camper while house is being built) Living Arrangements: Spouse/significant other Available Help at Discharge:  Family Type of Home: Other(Comment) Home Access: Stairs to enter Secretary/administrator of Steps: 3   Home Layout: One level     Bathroom Shower/Tub: Chief Strategy Officer: Standard         Additional Comments: has been staying in camper while house is being built (last 20 months) should be done ~3 weeks.      Prior Functioning/Environment Level of Independence: Independent        Comments: works for [employer] Problem List: Impaired UE functional use;Decreased strength;Decreased coordination      OT Treatment/Interventions: Self-care/ADL training;Patient/family education;Neuromuscular education;Therapeutic activities;Therapeutic exercise    OT Goals(Current goals can be found in the care plan section) Acute Rehab OT Goals Patient Stated Goal: regain independence OT Goal Formulation: With patient Time For Goal Achievement: 12/22/19 Potential to Achieve Goals: Good  OT Frequency: Min 3X/week   Barriers to D/C:            Co-evaluation              AM-PAC OT "6 Clicks" Daily Activity     Outcome Measure Help from another person eating meals?: None Help from another person taking care of personal grooming?: None Help from another person toileting, which includes using toliet, bedpan, or urinal?: A Little Help from another person bathing (including washing, rinsing, drying)?: A Little Help from another person to put on and taking off regular upper body clothing?: None Help from another person to put on and taking off regular lower body clothing?: A Little 6 Click Score: 21   End of Session Nurse Communication: Mobility status  Activity Tolerance: Patient tolerated treatment well Patient left: in chair;with call bell/phone within reach  OT Visit Diagnosis: Muscle weakness (generalized) (M62.81);Other symptoms and signs involving the nervous system (R29.898)                Time: 6195-0932 OT Time Calculation (min): 20  min Charges:  OT General Charges $OT Visit: 1 Visit OT Evaluation $OT Eval Moderate Complexity: 1 Mod  Dalphine Handing, MSOT, OTR/L Acute Rehabilitation Services [facility] Office Number: [phone] Pager: [phone]  Dalphine Handing 12/08/2019, 12:53 PM

## 2019-12-08 NOTE — Progress Notes (Signed)
Occupational Therapy Treatment Patient Details Name: Ivan Jones MRN: [id number] DOB: 11-16-1975 Today's Date: 12/08/2019    History of present illness Pt adm with bilateral hand numbness and weakness. Pt found to have transverse myelitis. Pt began treatment with steroids. PMH - DM, HTN, arthritis, Rt TKR, recent panic attack.    OT comments  Pt seen for additional OT session with focus on bil hand HEP (See exercise section). Focused L hand HEP on AROM HEP- mostly with finger lifts, ab/adduction, thumb circles, and isolated finger movements. R hand HEP focused on easy level theraputty with digit flexion/extension, ab/adduction, rolling putty, pinching putty, and resistive finger extension. Pt tolerated well. Wife then present at end of session for education. Continue to recommend OP neuro OT for progression of bil hand strength. Will continue to follow.   Follow Up Recommendations  Outpatient OT (neuro)    Equipment Recommendations  None recommended by OT    Recommendations for Other Services      Precautions / Restrictions Precautions Precautions: None Restrictions Weight Bearing Restrictions: No       Mobility Bed Mobility Overal bed mobility: Independent                Transfers Overall transfer level: Modified independent Equipment used: None                  Balance Overall balance assessment: Independent                                       ADL either performed or assessed with clinical judgement   ADL       Functional mobility during ADLs: Supervision/safety;Cueing for safety General ADL Comments: session focused on [facility] HEP for bil hands     Vision Patient Visual Report: No change from baseline     Perception     Praxis      Cognition Arousal/Alertness: Awake/alert Behavior During Therapy: WFL for tasks assessed/performed Overall Cognitive Status: Within Functional Limits for tasks assessed                                           Exercises Hand Exercises Wrist Extension: AROM;Left;10 reps Digit Composite Flexion: AROM;10 reps Composite Extension: AROM;10 reps Digit Composite Abduction: AROM;10 reps Digit Composite Adduction: AROM;10 reps Digit Lifts: AROM;10 reps;Seated Other Exercises Other Exercises: theraputty: finger extension, digit flexion, pinching x10 R/L hand   Shoulder Instructions       General Comments      Pertinent Vitals/ Pain       Pain Assessment: Faces Faces Pain Scale: Hurts a little bit Pain Location: uncomfortable tingling Pain Descriptors / Indicators: Tingling Pain Intervention(s): Monitored during session  Home Living Family/patient expects to be discharged to:: Other (Comment) (camper while house is being built) Living Arrangements: Spouse/significant other Available Help at Discharge: Family Type of Home: Other(Comment) Home Access: Stairs to enter Secretary/administrator of Steps: 3   Home Layout: One level     Bathroom Shower/Tub: Chief Strategy Officer: Standard         Additional Comments: has been staying in camper while house is being built (last 20 months) should be done ~3 weeks.      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 [employer]   Frequency  Min 3X/week        Progress Toward Goals  OT Goals(current goals can now be found in the care plan section)  Progress towards OT goals: Progressing toward goals  Acute Rehab OT Goals Patient Stated Goal: regain independence OT Goal Formulation: With patient Time For Goal Achievement: 12/22/19 Potential to Achieve Goals: Good  Plan Discharge plan remains appropriate    Co-evaluation                 AM-PAC OT "6 Clicks" Daily Activity     Outcome Measure   Help from another person eating meals?: None Help from another person taking care of personal grooming?: None Help from another person  toileting, which includes using toliet, bedpan, or urinal?: A Little Help from another person bathing (including washing, rinsing, drying)?: A Little Help from another person to put on and taking off regular upper body clothing?: None Help from another person to put on and taking off regular lower body clothing?: A Little 6 Click Score: 21    End of Session    OT Visit Diagnosis: Muscle weakness (generalized) (M62.81);Other symptoms and signs involving the nervous system (R29.898)   Activity Tolerance Patient tolerated treatment well   Patient Left in chair;with call bell/phone within reach   Nurse Communication Mobility status        Time: 0102-7253 OT Time Calculation (min): 15 min  Charges: OT General Charges $OT Visit: 1 Visit OT Evaluation $OT Eval Moderate Complexity: 1 Mod OT Treatments $Therapeutic Exercise: 8-22 mins  Dalphine Handing, MSOT, OTR/L Acute Rehabilitation Services [facility] Office Number: [phone] Pager: [phone]  Dalphine Handing 12/08/2019, 1:05 PM

## 2019-12-08 NOTE — Progress Notes (Signed)
PROGRESS NOTE    Ivan Jones  EVO:[id number] DOB: 1975/07/21 DOA: 12/03/2019 PCP: Marylen Ponto, MD   Chief Complaint  Patient presents with  . Hand Problem    Brief Narrative: 44 year old male with T2DM, HTN presented with bilateral hand numbness started October 30/2021 at noon, with weakness in the grip could not hold and drink up on the car door. He was seen in the ED MRI brain and thoracic spine normal, MRI cervical spine showed no discitis or osteomyelitis but did show multifocal cord lesions with possible mild enhancement at C5-C6 with transverse myelitis and demyelinating disease on the differential.  Seen by neurology LP unsuccessful in 10/31, underwent LP 11/1 under fluoroscopy. Patient placed on high-dose Solu-Medrol 1000 mg daily x5 days  Subjective: Some numbness tingling in upper and lower extremities but no worsening of weakness.  Right arm weakness is improving. Blood sugar has been running high, noted some dietary indiscretion  Assessment & Plan:   Transverse myelitis: Improving on high-dose steroid completed day 5 of 1000 mg Solu-Medrol today.  Continue to increase activity PT OT.  Underwent LP studies, unremarkable per neurology.   Type 2 diabetes mellitus without complication,Hemoglobin A1c is uncontrolled at 9.0: Uncontrolled hyperglycemia in the setting of high-dose steroid, dietary indiscretion.  We discussed about dietary compliance.  Adjusting insulin further today basal and bolus, will need to monitor to make sure sugar is stable as patient was not insulin prior to admission.  Home Janumet was on hold and we will resume. . Recent Labs  Lab 12/07/19 1216 12/07/19 1651 12/07/19 2058 12/08/19 0820 12/08/19 1159  GLUCAP 294* 256* 465* 241* 406*   Transaminitis: 2/2 ?overweight, trend, if not coming down check RUQ [place].  ALT down from 151>128,AST 67, total bili 1.4  Low TSH 0.2- check FT4- IT IS BORDERLINE high 1.13.  He has no tachycardia in fact bradycardic  in 50s at times, ?  Hyperthyroidism- will need follow-up thyroid function test and also follow-up with PCP or endocrinology upon discharge   Essential hypertension: BP is fairly controlled, hold losartan due to slightly uptrending creatinine.   Mild AKI, add gentle IV fluids.  Ensure hydration.  Hold losartan and meloxicam.  Leukocytosis in the setting of steroid.  Patient is afebrile.  Diet Order            Diet Carb Modified Fluid consistency: Thin; Room service appropriate? Yes  Diet effective now                Morbi obesity with BMI Body mass index is 35.33 kg/m.  Patient will benefit with weight loss and PCP follow-up.    DVT prophylaxis: heparin injection 5,000 Units Start: 12/04/19 0630 SCDs Start: 12/04/19 0618 Code Status:   Code Status: Full Code  Family Communication: plan of care discussed with patient and his wife at bedside.  Status is: Inpatient Remains inpatient appropriate because:Ongoing diagnostic testing needed not appropriate for outpatient work up and Inpatient level of care appropriate due to severity of illness  Dispo: The patient is from: Home              Anticipated d/c is to: Home              Anticipated d/c date is: 1 day if sugar remains a stable.                Patient currently is not medically stable to d/c.  Consultants:see note  Procedures:see note  Culture/Microbiology    Component  Value Date/Time   SDES CYTO CSF 12/05/2019 0903   SDES CYTO CSF 12/05/2019 0903   SPECREQUEST NONE 12/05/2019 0903   SPECREQUEST NONE 12/05/2019 0903   CULT  12/05/2019 0903    NO ANAEROBES ISOLATED; CULTURE IN PROGRESS FOR 5 DAYS   CULT  12/05/2019 0903    NO GROWTH 3 DAYS Performed at [facility], [address] [place], [place] [id number]    REPTSTATUS PENDING 12/05/2019 [id number]   REPTSTATUS 12/08/2019 FINAL 12/05/2019 [id number]    Other culture-see note  Medications: Scheduled Meds: . aspirin EC  325 mg Oral BID  . heparin  5,000 Units  Subcutaneous Q8H  . insulin aspart  0-20 Units Subcutaneous TID WC  . insulin aspart  0-5 Units Subcutaneous QHS  . insulin aspart  18 Units Subcutaneous TID WC  . insulin glargine  28 Units Subcutaneous BID  . lidocaine (PF)  5 mL Intradermal Once   Continuous Infusions:   Antimicrobials: Anti-infectives (From admission, onward)   None     Objective: Vitals: Today's Vitals   12/08/19 0600 12/08/19 0907 12/08/19 1200 12/08/19 1204  BP: 133/86  123/83   Pulse: 63   71  Resp: 11  15 11   Temp: 98 F (36.7 C)   98.8 F (37.1 C)  TempSrc: Oral   Oral  SpO2:    98%  Weight: 111.7 kg     Height:      PainSc:  0-No pain      Intake/Output Summary (Last 24 hours) at 12/08/2019 1527 Last data filed at 12/08/2019 0710 Gross per 24 hour  Intake 58 ml  Output --  Net 58 ml   Filed Weights   12/06/19 0500 12/07/19 0600 12/08/19 0600  Weight: 111.5 kg 110.3 kg 111.7 kg   Weight change: 1.4 kg  Intake/Output from previous day: 11/03 0701 - 11/04 0700 In: 710 [P.O.:660; IV Piggyback:50] Out: -  Intake/Output this shift: Total I/O In: 58 [IV Piggyback:58] Out: -   Examination: General exam:AAOx3,NAD,weak appearing.Obese. HEENT:Oral mucosa moist, Ear/Nose WNL grossly, dentition normal. Respiratory system: bilaterally clear,no wheezing or crackles,no use of accessory muscle. Cardiovascular system: S1 & S2 +, No JVD. Gastrointestinal system: Abdomen soft, NT,ND, BS+. Nervous System:Alert, awake, moving extremities and grossly non-focal. Extremities: No edema, distal peripheral pulses palpable.  Skin: No rashes,no icterus. MSK: Normal muscle bulk,tone, power.  Data Reviewed: I have personally reviewed following labs and imaging studies CBC: Recent Labs  Lab 12/03/19 1622 12/05/19 1142 12/08/19 0202  WBC 13.2* 22.6* 22.0*  NEUTROABS  --  21.2*  --   HGB 14.4 15.0 14.5  HCT 42.7 44.7 42.8  MCV 91.6 92.0 92.4  PLT 254 281 274   Basic Metabolic Panel: Recent Labs   Lab 12/03/19 1622 12/05/19 1142 12/08/19 0202  NA 140 136 138  K 4.3 4.1 4.3  CL 104 100 105  CO2 24 26 24   GLUCOSE 262* 430* 289*  BUN 13 29* 37*  CREATININE 1.16 1.18 1.28*  CALCIUM 9.7 10.2 9.9  MG  --  2.2  --    GFR: Estimated Creatinine Clearance: 92.2 mL/min (A) (by C-G formula based on SCr of 1.28 mg/dL (H)). Liver Function Tests: Recent Labs  Lab 12/05/19 1142 12/07/19 0925 12/08/19 0202  AST 58* 67* 56*  ALT 151* 128* 140*  ALKPHOS 71 80 76  BILITOT 1.0 1.4* 0.8  PROT 7.6 7.4 6.9  ALBUMIN 4.0 3.9 3.6   No results for input(s): LIPASE, AMYLASE in the last  168 hours. No results for input(s): AMMONIA in the last 168 hours. Coagulation Profile: No results for input(s): INR, PROTIME in the last 168 hours. Cardiac Enzymes: No results for input(s): CKTOTAL, CKMB, CKMBINDEX, TROPONINI in the last 168 hours. BNP (last 3 results) No results for input(s): PROBNP in the last 8760 hours. HbA1C: No results for input(s): HGBA1C in the last 72 hours. CBG: Recent Labs  Lab 12/07/19 1216 12/07/19 1651 12/07/19 2058 12/08/19 0820 12/08/19 1159  GLUCAP 294* 256* 465* 241* 406*   Lipid Profile: No results for input(s): CHOL, HDL, LDLCALC, TRIG, CHOLHDL, LDLDIRECT in the last 72 hours. Thyroid Function Tests: Recent Labs    12/07/19 0925  FREET4 1.13*   Anemia Panel: No results for input(s): VITAMINB12, FOLATE, FERRITIN, TIBC, IRON, RETICCTPCT in the last 72 hours. Sepsis Labs: No results for input(s): PROCALCITON, LATICACIDVEN in the last 168 hours.  Recent Results (from the past 240 hour(s))  Respiratory Panel by RT PCR (Flu A&B, Covid) - Nasopharyngeal Swab     Status: None   Collection Time: 12/04/19  6:17 AM   Specimen: Nasopharyngeal Swab  Result Value Ref Range Status   SARS Coronavirus 2 by RT PCR NEGATIVE NEGATIVE Final    Comment: (NOTE) SARS-CoV-2 target nucleic acids are NOT DETECTED.  The SARS-CoV-2 RNA is generally detectable in upper  respiratoy specimens during the acute phase of infection. The lowest concentration of SARS-CoV-2 viral copies this assay can detect is 131 copies/mL. A negative result does not preclude SARS-Cov-2 infection and should not be used as the sole basis for treatment or other patient management decisions. A negative result may occur with  improper specimen collection/handling, submission of specimen other than nasopharyngeal swab, presence of viral mutation(s) within the areas targeted by this assay, and inadequate number of viral copies (<131 copies/mL). A negative result must be combined with clinical observations, patient history, and epidemiological information. The expected result is Negative.  Fact Sheet for Patients:  https://www.moore.com/  Fact Sheet for Healthcare Providers:  https://www.young.biz/  This test is no t yet approved or cleared by the [place] FDA and  has been authorized for detection and/or diagnosis of SARS-CoV-2 by FDA under an Emergency Use Authorization (EUA). This EUA will remain  in effect (meaning this test can be used) for the duration of the COVID-19 declaration under Section 564(b)(1) of the Act, 21 U.S.C. section 360bbb-3(b)(1), unless the authorization is terminated or revoked sooner.     Influenza A by PCR NEGATIVE NEGATIVE Final   Influenza B by PCR NEGATIVE NEGATIVE Final    Comment: (NOTE) The Xpert Xpress SARS-CoV-2/FLU/RSV assay is intended as an aid in  the diagnosis of influenza from Nasopharyngeal swab specimens and  should not be used as a sole basis for treatment. Nasal washings and  aspirates are unacceptable for Xpert Xpress SARS-CoV-2/FLU/RSV  testing.  Fact Sheet for Patients: https://www.moore.com/  Fact Sheet for Healthcare Providers: https://www.young.biz/  This test is not yet approved or cleared by the [place] FDA and  has been  authorized for detection and/or diagnosis of SARS-CoV-2 by  FDA under an Emergency Use Authorization (EUA). This EUA will remain  in effect (meaning this test can be used) for the duration of the  Covid-19 declaration under Section 564(b)(1) of the Act, 21  U.S.C. section 360bbb-3(b)(1), unless the authorization is  terminated or revoked. Performed at [facility], [address] [place], [place] [address]   Anaerobic culture     Status: None (Preliminary result)  Collection Time: 12/05/19  9:03 AM   Specimen: PATH Cytology CSF; Cerebrospinal Fluid  Result Value Ref Range Status   Specimen Description CYTO CSF  Final   Special Requests NONE  Final   Gram Stain   Final    NO WBC SEEN NO ORGANISMS SEEN Performed at [facility], [address], [place], [place] [address]    Culture   Final    NO ANAEROBES ISOLATED; CULTURE IN PROGRESS FOR 5 DAYS   Report Status PENDING  Incomplete  CSF culture     Status: None   Collection Time: 12/05/19  9:03 AM   Specimen: PATH Cytology CSF; Cerebrospinal Fluid  Result Value Ref Range Status   Specimen Description CYTO CSF  Final   Special Requests NONE  Final   Gram Stain   Final    WBC PRESENT, PREDOMINANTLY MONONUCLEAR NO ORGANISMS SEEN CYTOSPIN SMEAR    Culture   Final    NO GROWTH 3 DAYS Performed at [facility], [address] [place], [place] [address]    Report Status 12/08/2019 FINAL  Final  Fungus Culture With Stain     Status: None (Preliminary result)   Collection Time: 12/05/19  9:03 AM   Specimen: PATH Cytology CSF; Cerebrospinal Fluid  Result Value Ref Range Status   Fungus Stain Final report  Final    Comment: (NOTE) Performed At: [facility] [address] [place], [place] [id number] Jolene Schimke MD [id number]    Fungus (Mycology) Culture PENDING  Incomplete   Fungal Source CSF  Final    Comment: Performed at [facility], [address], [place], [place] [address]  Hsv  Culture And Typing     Status: None   Collection Time: 12/05/19  9:03 AM   Specimen: PATH Cytology CSF; Cerebrospinal Fluid  Result Value Ref Range Status   HSV Culture/Type Comment  Final    Comment: (NOTE) Negative No Herpes simplex virus isolated. Performed At: [facility] [address] [place], [place] [id number] Jolene Schimke MD [id number]    Source of Sample CSF  Final    Comment: Performed at [facility], [address] [place], [place] [address]  Fungus Culture Result     Status: None   Collection Time: 12/05/19  9:03 AM  Result Value Ref Range Status   Result 1 Comment  Final    Comment: (NOTE) KOH/Calcofluor preparation:  no fungus observed. Performed At: [facility] [address] [place], [place] [id number] Jolene Schimke MD [id number]      Radiology Studies: No results found.   LOS: 4 days   Lanae Boast, MD [facility]  12/08/2019, 3:27 PM

## 2019-12-08 NOTE — Progress Notes (Addendum)
NEUROLOGY PROGRESS NOTE  Patient Ivan Jones a 44 y.o.with PMHx of has a past medical history of Arthritis, Complication of anesthesia, Diabetes mellitus without complication (HCC), History of kidney stones, and Hypertension, who presented on 10/29 to [facility] w/ retrosternal chest pain radiating to the left shoulder, felt to be an anxiety attack, followed by bilateral hand weakness / numbness on 10/30 (sudden onset). Started on pulse dose steroids 10/31, now completed as of 11/04 morning   Subjective: Patient feels good. Feels his grip continues to slowly improve  Had some recurrent tingling in his thighs overnight which has resolved   Exam:     Vitals:   12/05/19 2100 12/06/19 0535  BP: 137/84 122/87  Pulse: 81 75  Resp: 15 16  Temp: 98.4 F (36.9 C) 98.3 F (36.8 C)  SpO2: 98% 99%      Physical Exam Constitutional: Appears well-developed and well-nourished.  Psych: Affect appropriate to situation Eyes: No scleral injection HENT: No OP obstrucion Head: Normocephalic.  Cardiovascular: Normal rate and regular rhythm.  Respiratory: Effort normal, non-labored breathing GI: Soft. No distension. There is no tenderness.  Skin: WDI   Neuro: Mental Status: Alert, oriented, thought content appropriate.  Speech fluent without evidence of aphasia.  Able to follow 3 step commands without difficulty. Cranial Nerves: II:  Visual fields grossly normal,  III,IV, VI: ptosis not present, extra-ocular motions intact  V,VII: smile symmetric,  VIII: hearing normal bilaterally  Motor: Right :Upper extremityLeft: Upper extremity 5/5 deltoid5/5 deltoid 5/5 tricep5/5 tricep 5/5 biceps5/5 biceps  4/5wrist  flexion5/5 wrist flexion 5/5 wrist extension5/5 wrist extension 4/20finger ext4/25finger ext 4/5 finger flexion4/5 finer flext Lower extremityLower extremity 5/5 hip flexor5/5 hip flexor 5/5 hip adductors5/5 hip adductors 5/5 hip abductors5/5 hip abductors 5/5 quadricep5/5 quadriceps  5/5 hamstrings5/5 hamstrings Tone and bulk:normal tone throughout; no atrophy noted Sensory: States that bilateral hands still have tingling sensation with left greater than right.  Right/left forearmcold temperature mildly impaired, moreso distally than proximally, and moreso plamar than dorsal surface of hand Medications:  Scheduled: . aspirin EC  325 mg Oral BID  . heparin  5,000 Units Subcutaneous Q8H  . insulin aspart  0-20 Units Subcutaneous TID WC  . insulin aspart  0-5 Units Subcutaneous QHS  . insulin aspart  7 Units Subcutaneous TID WC  . insulin glargine  10 Units Subcutaneous BID  . lidocaine (PF)  5 mL Intradermal Once  . losartan  100 mg Oral Daily  . meloxicam  15 mg Oral Daily    Pertinent Labs/Diagnostics: Results for Ivan, Jones (MRN [id number]) as of 12/06/2019 10:21  Ref. Range 12/05/2019 09:03  Appearance, CSF Latest Ref Range: CLEAR  CLEAR (A)  Glucose, CSF Latest Ref Range: 40 - 70 mg/dL 809 (H)  RBC Count, CSF Latest Ref Range: 0 /cu mm 0  WBC, CSF Latest Ref Range: 0 - 5 /cu mm 3  Other  Cells, CSF Unknown TOO FEW TO COUNT, SMEAR AVAILABLE FOR REVIEW  Color, CSF Latest Ref Range: COLORLESS  COLORLESS  Supernatant Unknown NOT INDICATED  Total  Protein, CSF Latest Ref Range: 15 - 45 mg/dL 40  Tube # Unknown 3  PENDING CSF LABS Pending labs: -Lumbar puncture with cytology including: Culture-negative Cytology - negative oligoclonal bands-pending AQP4 antibody-pending enterovirus PCR-pending cytomegalovirus PCR-pending Epstein-Barr virus PCR-pending herpes virus I/II antibody 0.64 varicella-zoster virus PCR-pending IgG index-pending [facility] panel [facility]-pending VRDL-neg - Serumaquaporin-4 antibodies(AQP4), anti-myelin oligodendrocyte glycoprotein antibodies(MOG), arbovirus panel including West Nile virus antibodies -All pending  Imaging Results (Last 48 hours)  DG Fluoro Rm 1-60 Min - No Report  Result Date: 12/04/2019 CLINICAL DATA:  Request for lumbar puncture under fluoroscopic guidance. EXAM: DIAGNOSTIC LUMBAR PUNCTURE UNDER FLUOROSCOPIC GUIDANCE FLUOROSCOPY TIME:  Fluoroscopy Time: Not recorded (fluoroscopy unit malfunction) Radiation Exposure Index (if provided by the fluoroscopic device): None applicable Number of Acquired Spot Images: 0 PROCEDURE: Informed consent was obtained from the patient prior to the procedure, including potential complications of headache, allergy, and pain. With the patient prone, the lower back was prepped with Betadine. 1% Lidocaine was used for local anesthesia. Lumbar puncture was performed at the L3-4 and L2-3 levels using a 21 gauge needle with return of no fluid. The table malfunctioned during the procedure and procedure was terminated prior to obtaining fluid. IMPRESSION: Attempted lumbar puncture under fluoroscopic guidance. Fluid was not obtained for analysis. Procedure will be rescheduled for 12/05/2019. Electronically Signed   By: Norva Pavlov M.D.   On: 12/04/2019 14:40   DG FL GUIDED LUMBAR  PUNCTURE  Result Date: 12/05/2019 CLINICAL DATA:  Signal abnormalities in the cervical cord, query transverse myelitis or demyelinating process EXAM: DIAGNOSTIC LUMBAR PUNCTURE UNDER FLUOROSCOPIC GUIDANCE FLUOROSCOPY TIME:  Fluoroscopy Time:  0 minutes, 24 seconds Radiation Exposure Index (if provided by the fluoroscopic device): 5.4 mGy Number of Acquired Spot Images: 0 PROCEDURE: I discussed the risks (including hemorrhage, infection, and nerve damage, among others), benefits, and alternatives to fluoroscopically guided lumbar puncture with the patient. We specifically discussed the high likelihood of technical success of the procedure. The patient understood and elected to undergo the procedure. Standard time-out was employed. Following sterile skin prep and local anesthetic administration consisting of 1 percent lidocaine, a 5 inch 20 gauge spinal needle was advanced without difficulty into the thecal sac at the L5-S1 level under fluoroscopic guidance. Clear CSF was returned. Opening pressure was 3 cm of water. A total of 12 cc of clear CSF was collected in 4 vials. The needle was subsequently removed and the skin cleansed and bandaged. No immediate complications were observed. IMPRESSION: 1. Technically successful fluoroscopically guided lumbar puncture at the L5-S1 level, yielding 12 cc of clear CSF. Electronically Signed   By: Gaylyn Rong M.D.   On: 12/05/2019 10:14     Assessment:  Ivan Jones a 44 y.o.malewith acute rapidly progressing paraparesisand paresthesia with clearly defined sensory levelat C3 without autonomic involvement.MRI showed multifocal cervical cord lesions and a few with borderline enhancement.The patient meets criteria for transverse myelitis withacute flaccid paralysisbeing less likelyand will need further emergent diagnostic workup forinfectious([place] and [place] had recent tick bite), CNS demyelinating, systemic  inflammatory, autoimmune and possibleparaneoplastic disorders and emergentevaluation andtreatment   Impression: This is a 44 year old gentleman with transverse myelitis improving on pulse dose steroids.  Lumbar puncture is reassuring against infection, as is his improvement with his steroids.  Recommendations: -Continue Solu-Medrol 1000 mg/day for 5 doses today, monitoring BG today with plans for early discharge tomorrow morning -We will need continued physical therapy as outpatient -Will need follow-up with outpatient neurologist-[facility]

## 2019-12-08 NOTE — Progress Notes (Signed)
Report given to Shanda Bumps, Charity fundraiser. Patient is alert and oriented x4 resting in bed with call light in reach. VSS. IV Solumedrol in place, last dose today per neuro. Elevated blood sugar noted overnight. Glucose was 289 this AM per CMP results. SR on tele, ACHS. Continue neuro checks.  Patient to work with PT and OT today.  Post discharge, patient will follow up with Dr. Epimenio Foot neurologist.  WBC 22.0, Hgb 14.5, platelets 274, K+ 4.3 and Creat 1.28 this AM.

## 2019-12-08 NOTE — Evaluation (Signed)
Physical Therapy Evaluation Patient Details Name: Ivan Jones MRN: [id number] DOB: 1975-07-26 Today's Date: 12/08/2019   History of Present Illness  Pt adm with bilateral hand numbness and weakness. Pt found to have transverse myelitis. Pt began treatment with steroids. PMH - DM, HTN, arthritis, Rt TKR, recent panic attack.   Clinical Impression  Pt with primary complaint of hand weakness and is mobilizing well. He feels he is very close to baseline with mobility. Expect he will return to baseline quickly. Instructed him in some home exercises for balance.      Follow Up Recommendations No PT follow up    Equipment Recommendations       Recommendations for Other Services       Precautions / Restrictions Precautions Precautions: None      Mobility  Bed Mobility Overal bed mobility: Independent                  Transfers Overall transfer level: Modified independent Equipment used: None                Ambulation/Gait Ambulation/Gait assistance: Modified independent (Device/Increase time) Gait Distance (Feet): 475 Feet Assistive device: None Gait Pattern/deviations: Wide base of support   Gait velocity interpretation: >4.37 ft/sec, indicative of normal walking speed General Gait Details: Pt with steady gait but wide based.   Stairs            Wheelchair Mobility    Modified Rankin (Stroke Patients Only)       Balance Overall balance assessment: Independent                           High level balance activites: Direction changes;Turns;Sudden stops;Head turns High Level Balance Comments: Pt able to perform above activities without difficulty. Pt unable to perform single leg stance which he states is not a new problem             Pertinent Vitals/Pain Pain Assessment: Faces Faces Pain Scale: Hurts a little bit Pain Location: uncomfortable tingling Pain Descriptors / Indicators: Tingling Pain Intervention(s): Monitored during  session    Home Living Family/patient expects to be discharged to:: Other (Comment) (camper while house is being built) Living Arrangements: Spouse/significant other Available Help at Discharge: Family Type of Home: Other(Comment) Home Access: Stairs to enter   Secretary/administrator of Steps: 3 Home Layout: One level   Additional Comments: has been staying in camper while house is being built (last 20 months) should be done ~3 weeks.    Prior Function Level of Independence: Independent               Hand Dominance   Dominant Hand: Right    Extremity/Trunk Assessment   Upper Extremity Assessment Upper Extremity Assessment: Defer to OT evaluation    Lower Extremity Assessment Lower Extremity Assessment: Overall WFL for tasks assessed       Communication   Communication: No difficulties  Cognition Arousal/Alertness: Awake/alert Behavior During Therapy: WFL for tasks assessed/performed Overall Cognitive Status: Within Functional Limits for tasks assessed                                        General Comments      Exercises     Assessment/Plan    PT Assessment Patent does not need any further PT services  PT Problem List  PT Treatment Interventions      PT Goals (Current goals can be found in the Care Plan section)  Acute Rehab PT Goals PT Goal Formulation: All assessment and education complete, DC therapy    Frequency     Barriers to discharge        Co-evaluation               AM-PAC PT "6 Clicks" Mobility  Outcome Measure Help needed turning from your back to your side while in a flat bed without using bedrails?: None Help needed moving from lying on your back to sitting on the side of a flat bed without using bedrails?: None Help needed moving to and from a bed to a chair (including a wheelchair)?: None Help needed standing up from a chair using your arms (e.g., wheelchair or bedside chair)?: None Help needed  to walk in hospital room?: None Help needed climbing 3-5 steps with a railing? : None 6 Click Score: 24    End of Session   Activity Tolerance: Patient tolerated treatment well   Nurse Communication: Mobility status PT Visit Diagnosis: Other abnormalities of gait and mobility (R26.89)    Time: 3267-1245 PT Time Calculation (min) (ACUTE ONLY): 12 min   Charges:   PT Evaluation $PT Eval Low Complexity: 1 Low          [facility] PT Acute Rehabilitation Services Pager [phone] Office [phone]   Angelina Ok [facility] 12/08/2019, 11:09 AM

## 2019-12-09 ENCOUNTER — Other Ambulatory Visit (HOSPITAL_COMMUNITY): Payer: Self-pay | Admitting: Internal Medicine

## 2019-12-09 LAB — CBC
HCT: 42.9 % (ref 39.0–52.0)
Hemoglobin: 14.5 g/dL (ref 13.0–17.0)
MCH: 31 pg (ref 26.0–34.0)
MCHC: 33.8 g/dL (ref 30.0–36.0)
MCV: 91.9 fL (ref 80.0–100.0)
Platelets: 281 10*3/uL (ref 150–400)
RBC: 4.67 MIL/uL (ref 4.22–5.81)
RDW: 12.6 % (ref 11.5–15.5)
WBC: 23.9 10*3/uL — ABNORMAL HIGH (ref 4.0–10.5)
nRBC: 0 % (ref 0.0–0.2)

## 2019-12-09 LAB — COMPREHENSIVE METABOLIC PANEL
ALT: 132 U/L — ABNORMAL HIGH (ref 0–44)
AST: 44 U/L — ABNORMAL HIGH (ref 15–41)
Albumin: 3.4 g/dL — ABNORMAL LOW (ref 3.5–5.0)
Alkaline Phosphatase: 65 U/L (ref 38–126)
Anion gap: 10 (ref 5–15)
BUN: 37 mg/dL — ABNORMAL HIGH (ref 6–20)
CO2: 22 mmol/L (ref 22–32)
Calcium: 9.6 mg/dL (ref 8.9–10.3)
Chloride: 106 mmol/L (ref 98–111)
Creatinine, Ser: 1.13 mg/dL (ref 0.61–1.24)
GFR, Estimated: >60 mL/min (ref >60)
Glucose, Bld: 151 mg/dL — ABNORMAL HIGH (ref 70–99)
Potassium: 4.2 mmol/L (ref 3.5–5.1)
Sodium: 138 mmol/L (ref 135–145)
Total Bilirubin: 0.9 mg/dL (ref 0.3–1.2)
Total Protein: 6.4 g/dL — ABNORMAL LOW (ref 6.5–8.1)

## 2019-12-09 LAB — GLUCOSE, CAPILLARY
Glucose-Capillary: 139 mg/dL — ABNORMAL HIGH (ref 70–99)
Glucose-Capillary: 105 mg/dL — ABNORMAL HIGH (ref 70–99)
Glucose-Capillary: 92 mg/dL (ref 70–99)

## 2019-12-09 MED ORDER — SITAGLIP PHOS-METFORMIN HCL ER 50-1000 MG PO TB24: 1.0000 | ORAL_TABLET | Freq: Two times a day (BID) | ORAL | Status: DC

## 2019-12-09 MED ORDER — INSULIN ASPART 100 UNIT/ML ~~LOC~~ SOLN: 10.0000 [IU] | Freq: Three times a day (TID) | SUBCUTANEOUS | Status: DC

## 2019-12-09 MED ORDER — NOVOLOG FLEXPEN 100 UNIT/ML ~~LOC~~ SOPN: PEN_INJECTOR | SUBCUTANEOUS | 0 refills | Status: DC

## 2019-12-09 MED ORDER — METFORMIN HCL 500 MG PO TABS: 1000.0000 mg | ORAL_TABLET | Freq: Two times a day (BID) | ORAL | Status: DC

## 2019-12-09 MED ORDER — INSULIN ASPART 100 UNIT/ML ~~LOC~~ SOLN: 0.0000 [IU] | Freq: Three times a day (TID) | SUBCUTANEOUS | Status: DC

## 2019-12-09 MED ORDER — INSULIN GLARGINE 100 UNIT/ML ~~LOC~~ SOLN
14.0000 [IU] | Freq: Two times a day (BID) | SUBCUTANEOUS | Status: DC
  Administered 2019-12-09: 14 [IU] via SUBCUTANEOUS
  Filled 2019-12-09 (×2): qty 0.14

## 2019-12-09 MED ORDER — LINAGLIPTIN 5 MG PO TABS: 5.0000 mg | ORAL_TABLET | Freq: Every day | ORAL | Status: DC

## 2019-12-09 MED ORDER — "PEN NEEDLES 3/16"" 31G X 5 MM MISC": 0 refills | Status: DC

## 2019-12-09 MED ORDER — BLOOD GLUCOSE MONITOR KIT: PACK | 0 refills | Status: DC

## 2019-12-09 MED FILL — NOVOLOG FLEXPEN SYRINGE: 100 | 30 days supply | Qty: 15 | Fill #0

## 2019-12-09 MED FILL — PENTIPS 32G X 4 MM MISC: 32G X 4 MM | 30 days supply | Qty: 100 | Fill #0

## 2019-12-09 NOTE — Progress Notes (Signed)
Inpatient Diabetes Program Recommendations  AACE/ADA: New Consensus Statement on Inpatient Glycemic Control (2015)  Target Ranges:  Prepandial:   less than 140 mg/dL      Peak postprandial:   less than 180 mg/dL (1-2 hours)      Critically ill patients:  140 - 180 mg/dL   Lab Results  Component Value Date   GLUCAP 105 (H) 12/09/2019   HGBA1C 9.0 (H) 12/05/2019    Review of Glycemic Control Results for JEFFREY, GRAEFE (MRN [id number]) as of 12/09/2019 15:01  Ref. Range 12/08/2019 21:46 12/09/2019 07:56 12/09/2019 11:53  Glucose-Capillary Latest Ref Range: 70 - 99 mg/dL 119 (H) 139 (H) 105 (H)    Note:  Spoke with patient and spouse at bedside.  Educated patient on insulin pen use at home. Reviewed contents of insulin flexpen starter kit. Reviewed all steps of insulin pen including attachment of needle, 2-unit air shot, dialing up dose, giving injection, removing needle, disposal of sharps, storage of unused insulin, disposal of insulin etc. Patient able to provide successful return demonstration. Also reviewed troubleshooting with insulin pen. MD to give patient Rxs for insulin pens and insulin pen needles.  Educated patient on rapid acting insulin as he will be discharging on SSI.  Placed a FreeStyle Libre to left arm.  Educated patient on application and use of CGM.  Educated on how to administer SSI.  He will check his CBG ac/hs.  If less than 178m/dl at bedtime he can have a small snack.  He will f/u with pcp in 1-2 weeks.    Will continue to follow while inpatient.  Thank you, JReche Dixon RN, BSN Diabetes Coordinator Inpatient Diabetes Program [phone](team pager from 8a-5p)

## 2019-12-09 NOTE — Discharge Summary (Signed)
Physician Discharge Summary  Delfin Squillace RAQ:[id number] DOB: 11-03-1975 DOA: 12/03/2019  PCP: Ronita Hipps, MD  Admit date: 12/03/2019 Discharge date: 12/09/2019  Admitted From: home Disposition:  home  Recommendations for Outpatient Follow-up:  1. Follow up with PCP in 1-2 weeks 2. Please obtain BMP/CBC in one week 3. Please follow up on the following pending results:  Home Health:No  Equipment/Devices:Glucose sensor  Discharge Condition:Stable Code Status:   Code Status: Full Code Diet recommendation:  Diet Order            Diet Carb Modified           Diet Carb Modified Fluid consistency: Thin; Room service appropriate? Yes  Diet effective now                  Brief/Interim Summary:  44 year old male with T2DM, HTN presented with bilateral hand numbness started October 30/2021 at noon, with weakness in the grip could not hold and drink up on the car door. He was seen in the ED MRI brain and thoracic spine normal, MRI cervical spine showed no discitis or osteomyelitis but did show multifocal cord lesions with possible mild enhancement at C5-C6 with transverse myelitis and demyelinating disease on the differential.  Seen by neurology LP unsuccessful in 10/31, underwent LP 11/1 under fluoroscopy. Patient placed on high-dose Solu-Medrol 1000 mg daily x5 days Patient completed high-dose steroid 11/4 and was kept an additional night to monitor his blood sugar as he was in significant amount of insulin and blood sugar was already improving. Insulin has been weaned down, he will be discharged on sliding scale insulin in case blood sugar goes up, patient will be educated regarding how to do sliding scale insulin FlexPen. Patient educated on risk of hypoglycemia.  Discharge Diagnoses:  Transverse myelitis: Improving on high-dose steroid completed day 5 of 1000 mg Solu-Medrol 11/4, right arm is strong but some weakness in the left arm.  No other weakness numbness tingling.underwent  LP studies, unremarkable per neurology.   Type 2 diabetes mellitus without complication,Hemoglobin K5G is uncontrolled at 9.0: Uncontrolled hyperglycemia in the setting of high-dose steroid, dietary indiscretion.  Slowly weaning off insulin as he is off steroid.  Blood sugar improving.  We will just keep him on sliding scale insulin for his discharge home and I do not anticipate he will need more than a week or so he was placed on modified sliding scale to prevent any hypoglycemia, provided him with instruction regarding hypoglycemia especially while on insulin and how to recognize and treat.  He was seen by diabetes educator and was given extensive education abd insulin use, sugar mangement, hypoglycemia and had Freestyle Libre applied to his arm  Transaminitis: 2/2 ?overweight.  AST 44 ALT 132.  Advise repeat LFTs in a week if not improving will need PCP follow-up for right upper quadrant ultrasound/further work-up.  Currently no abdominal pain.   Low TSH 0.2- check FT4- It is border high 1.13.He has no tachycardia in fact bradycardic in 50s at times, ?  Hyperthyroidism vs abnormal labs in the setting of acute illness-will need follow-up thyroid function test and also follow-up with PCP or endocrinology upon discharge.  Patient has been well instructed and in fact I called him on 12/13/2019 at 2:45 PM spoke to him personally on his phone.  Essential hypertension: He will continue his home meds.   Leucocytosis-from steroid.  He is afebrile.  Monitor with repeat CBC in 1 week.  Consults:  Neurology, DM coordinator  Subjective:  Alert awake oriented no new complaints. Eager to go home today. Discharge Exam: Vitals:   12/09/19 0700 12/09/19 1153  BP:  (!) 148/85  Pulse: 68 60  Resp: 12 17  Temp: 98 F (36.7 C) 99 F (37.2 C)  SpO2: 98% 98%   General: Pt is alert, awake, not in acute distress Cardiovascular: RRR, S1/S2 +, no rubs, no gallops Respiratory: CTA bilaterally, no wheezing, no  rhonchi Abdominal: Soft, NT, ND, bowel sounds + Extremities: no edema, no cyanosis  Discharge Instructions  Discharge Instructions    Ambulatory referral to Neurology   Complete by: As directed    An appointment is requested in approximately: 2 weeks   Ambulatory referral to Nutrition and Diabetic Education   Complete by: As directed    Diet Carb Modified   Complete by: As directed    Discharge instructions   Complete by: As directed    Please call call MD or return to ER for similar or worsening recurring problem that brought you to hospital or if any fever,nausea/vomiting,abdominal pain, uncontrolled pain, chest pain,  shortness of breath or any other alarming symptoms.  Please follow-up your doctor as instructed in a week time and call the office for appointment.  Please avoid alcohol, smoking, or any other illicit substance and maintain healthy habits including taking your regular medications as prescribed.  You were cared for by a hospitalist during your hospital stay. If you have any questions about your discharge medications or the care you received while you were in the hospital after you are discharged, you can call the unit and ask to speak with the hospitalist on call if the hospitalist that took care of you is not available.  Once you are discharged, your primary care physician will handle any further medical issues. Please note that NO REFILLS for any discharge medications will be authorized once you are discharged, as it is imperative that you return to your primary care physician (or establish a relationship with a primary care physician if you do not have one) for your aftercare needs so that they can reassess your need for medications and monitor your lab values  Check blood sugar 3 times a day and bedtime at home. If blood sugar running above 200 less than 70 please call your MD to adjust insulin. If blood sugars running less 100 do not use insulin and call MD. If you  noticed signs and symptoms of hypoglycemia or low blood sugar like jitteriness, confusion, thirst, tremor, sweating- Check blood sugar, drink sugary drink/biscuits/sweets to increase sugar level and call MD or return to ER.  Novolog Insulin Pen- Order # [id number] Blood glucose meter - #[id number]  Insulin pen needles - #[id number]   Increase activity slowly   Complete by: As directed      Allergies as of 12/09/2019   No Known Allergies     Medication List    STOP taking these medications   HYDROmorphone 2 MG tablet Commonly known as: DILAUDID   losartan 100 MG tablet Commonly known as: COZAAR   polyethylene glycol 17 g packet Commonly known as: MIRALAX / GLYCOLAX     TAKE these medications   aspirin EC 325 MG tablet Take 1 tablet (325 mg total) by mouth 2 (two) times daily.   blood glucose meter kit and supplies Kit Dispense based on patient and insurance preference. Use up to four times daily as directed. (FOR ICD-9 250.00, 250.01).   docusate sodium 100 MG capsule Commonly known as: Colace  Take 1 capsule (100 mg total) by mouth 2 (two) times daily as needed for mild constipation.   irbesartan 75 MG tablet Commonly known as: AVAPRO Take 75 mg by mouth daily.   Janumet XR 50-1000 MG Tb24 Generic drug: SitaGLIPtin-MetFORMIN HCl Take 1 tablet by mouth 2 (two) times daily.   meloxicam 15 MG tablet Commonly known as: MOBIC Take 15 mg by mouth daily.   methocarbamol 500 MG tablet Commonly known as: Robaxin Take 1 tablet (500 mg total) by mouth every 6 (six) hours as needed for muscle spasms.   NovoLOG FlexPen 100 UNIT/ML FlexPen Generic drug: insulin aspart CBG 70 - 120:0 units CBG 121 - 150:0 unit CBG 151 - 200:1 units CBG 201 - 250: 2 units CBG 251 - 300: 4 units CBG 301 - 350: 6 units CBG 351 - 400: 7 units > 400 call you MD   oxyCODONE-acetaminophen 7.5-325 MG tablet Commonly known as: PERCOCET Take 1 tablet by mouth every 6 (six) hours as needed for moderate pain.    Pen Needles 3/16" 31G X 5 MM Misc Use tid with insulin inj       Follow-up Information    [place], Nanine Means, MD Follow up in 1 week(s).   Specialty: Neurology Contact information: [place] [address] [phone]        Ronita Hipps, MD Follow up in 1 week(s).   Specialty: Family Medicine Contact information: [place] [phone]              No Known Allergies  The results of significant diagnostics from this hospitalization (including imaging, microbiology, ancillary and laboratory) are listed below for reference.    Microbiology: Recent Results (from the past 240 hour(s))  Respiratory Panel by RT PCR (Flu A&B, Covid) - Nasopharyngeal Swab     Status: None   Collection Time: 12/04/19  6:17 AM   Specimen: Nasopharyngeal Swab  Result Value Ref Range Status   SARS Coronavirus 2 by RT PCR NEGATIVE NEGATIVE Final    Comment: (NOTE) SARS-CoV-2 target nucleic acids are NOT DETECTED.  The SARS-CoV-2 RNA is generally detectable in upper respiratoy specimens during the acute phase of infection. The lowest concentration of SARS-CoV-2 viral copies this assay can detect is 131 copies/mL. A negative result does not preclude SARS-Cov-2 infection and should not be used as the sole basis for treatment or other patient management decisions. A negative result may occur with  improper specimen collection/handling, submission of specimen other than nasopharyngeal swab, presence of viral mutation(s) within the areas targeted by this assay, and inadequate number of viral copies (<131 copies/mL). A negative result must be combined with clinical observations, patient history, and epidemiological information. The expected result is Negative.  Fact Sheet for Patients:  PinkCheek.be  Fact Sheet for Healthcare Providers:  GravelBags.it  This test is no t yet approved or  cleared by the [place] FDA and  has been authorized for detection and/or diagnosis of SARS-CoV-2 by FDA under an Emergency Use Authorization (EUA). This EUA will remain  in effect (meaning this test can be used) for the duration of the COVID-19 declaration under Section 564(b)(1) of the Act, 21 U.S.C. section 360bbb-3(b)(1), unless the authorization is terminated or revoked sooner.     Influenza A by PCR NEGATIVE NEGATIVE Final   Influenza B by PCR NEGATIVE NEGATIVE Final    Comment: (NOTE) The Xpert Xpress SARS-CoV-2/FLU/RSV assay is intended as an aid in  the diagnosis of influenza  from Nasopharyngeal swab specimens and  should not be used as a sole basis for treatment. Nasal washings and  aspirates are unacceptable for Xpert Xpress SARS-CoV-2/FLU/RSV  testing.  Fact Sheet for Patients: PinkCheek.be  Fact Sheet for Healthcare Providers: GravelBags.it  This test is not yet approved or cleared by the [place] FDA and  has been authorized for detection and/or diagnosis of SARS-CoV-2 by  FDA under an Emergency Use Authorization (EUA). This EUA will remain  in effect (meaning this test can be used) for the duration of the  Covid-19 declaration under Section 564(b)(1) of the Act, 21  U.S.C. section 360bbb-3(b)(1), unless the authorization is  terminated or revoked. Performed at [facility], [place] [address] [place], [place] [address]   Anaerobic culture     Status: None (Preliminary result)   Collection Time: 12/05/19  9:03 AM   Specimen: PATH Cytology CSF; Cerebrospinal Fluid  Result Value Ref Range Status   Specimen Description CYTO CSF  Final   Special Requests NONE  Final   Gram Stain   Final    NO WBC SEEN NO ORGANISMS SEEN Performed at [facility], [place] [address], [place], [place] [address]    Culture   Final    NO ANAEROBES ISOLATED; CULTURE IN PROGRESS FOR 5 DAYS   Report Status PENDING   Incomplete  CSF culture     Status: None   Collection Time: 12/05/19  9:03 AM   Specimen: PATH Cytology CSF; Cerebrospinal Fluid  Result Value Ref Range Status   Specimen Description CYTO CSF  Final   Special Requests NONE  Final   Gram Stain   Final    WBC PRESENT, PREDOMINANTLY MONONUCLEAR NO ORGANISMS SEEN CYTOSPIN SMEAR    Culture   Final    NO GROWTH 3 DAYS Performed at [facility], [place] [address] [place], [place] [address]    Report Status 12/08/2019 FINAL  Final  Fungus Culture With Stain     Status: None (Preliminary result)   Collection Time: 12/05/19  9:03 AM   Specimen: PATH Cytology CSF; Cerebrospinal Fluid  Result Value Ref Range Status   Fungus Stain Final report  Final    Comment: (NOTE) Performed At: [facility] [place], [place] [id number] Rush Farmer MD ZS:[id number]    Fungus (Mycology) Culture PENDING  Incomplete   Fungal Source CSF  Final    Comment: Performed at [facility], [place] [address] [place], [place] [address]  Hsv Culture And Typing     Status: None   Collection Time: 12/05/19  9:03 AM   Specimen: PATH Cytology CSF; Cerebrospinal Fluid  Result Value Ref Range Status   HSV Culture/Type Comment  Final    Comment: (NOTE) Negative No Herpes simplex virus isolated. Performed At: [facility] [place], [place] [id number] Rush Farmer MD SE:[id number]    Source of Sample CSF  Final    Comment: Performed at [facility], [place] [address], [place],  [address]  Fungus Culture Result     Status: None   Collection Time: 12/05/19  9:03 AM  Result Value Ref Range Status   Result 1 Comment  Final    Comment: (NOTE) KOH/Calcofluor preparation:  no fungus observed. Performed At: [facility] [place], [place] [id number] Rush Farmer MD EV:[id number]     Procedures/Studies: MR Brain W and Wo Contrast  Result Date: 12/04/2019 CLINICAL DATA:   Decreased  hand dexterity. Possible demyelinating disease. EXAM: MRI HEAD WITHOUT AND WITH CONTRAST TECHNIQUE: Multiplanar, multiecho pulse sequences of the brain and surrounding structures were obtained without and with intravenous contrast. CONTRAST:  62m GADAVIST GADOBUTROL 1 MMOL/ML IV SOLN COMPARISON:  None. FINDINGS: Brain: No acute infarct, acute hemorrhage or extra-axial collection. Normal white matter signal. Normal volume of CSF spaces. No chronic microhemorrhage. Normal midline structures. There is no abnormal contrast enhancement. Vascular: Normal flow voids. Skull and upper cervical spine: Normal marrow signal. Sinuses/Orbits: Negative. Other: None. IMPRESSION: Normal brain MRI. No evidence of demyelinating disease. Electronically Signed   By: KUlyses JarredM.D.   On: 12/04/2019 04:16   MR Cervical Spine W or Wo Contrast  Result Date: 12/03/2019 CLINICAL DATA:  Neck pain, acute, infection suspected. EXAM: MRI CERVICAL SPINE WITHOUT AND WITH CONTRAST TECHNIQUE: Multiplanar and multiecho pulse sequences of the cervical spine, to include the craniocervical junction and cervicothoracic junction, were obtained without and with intravenous contrast. CONTRAST:  143mGADAVIST GADOBUTROL 1 MMOL/ML IV SOLN COMPARISON:  None. FINDINGS: Please note that image quality is degraded by motion artifact. Alignment: Reversal of lordosis. Minimal grade 1 C4-5 anterolisthesis. Vertebrae: No focal osseous lesion. No bone marrow edema or abnormal enhancement. Cord: Multifocal T2 hyperintense cord lesions at the C3, C5 through T1 levels. Normal cord caliber. Smaller T2 hyperintense cord lesions are likely present at the C3-4 level as well. Mild hyperintense signal within the cord at the C5-6 level on sagittal postcontrast imaging may reflect artifact versus minimal enhancement. Posterior Fossa, vertebral arteries: Negative. Disc levels: Multilevel desiccation, osteophytosis and disc space loss. C2-3: Uncovertebral and  facet hypertrophy. Patent spinal canal and right neural foramen. Mild left neural foraminal narrowing. C3-4: Uncovertebral and facet hypertrophy with shallow central protrusion. Patent spinal canal. Mild bilateral neural foraminal narrowing. C4-5: Uncovertebral and facet hypertrophy with shallow central protrusion. Mild bilateral neural foraminal narrowing. Patent spinal canal. C5-6: Disc osteophyte complex with superimposed right subarticular and left paracentral protrusions abutting the ventral cord. Uncovertebral and facet hypertrophy. Partial effacement of the ventral CSF containing spaces. Patent spinal canal. Mild to moderate bilateral neural foraminal narrowing. C6-7: Disc osteophyte complex with superimposed right foraminal/subarticular protrusion. Uncovertebral and facet hypertrophy. There may be a small inferiorly migrated left paracentral extrusion. Mild spinal canal and moderate bilateral neural foraminal narrowing. C7-T1: Uncovertebral and facet hypertrophy. Mild right greater than left neural foraminal narrowing. Patent spinal canal. Paraspinal tissues: Negative. IMPRESSION: No evidence of discitis osteomyelitis. Multifocal cervical cord lesions with possible mild enhancement at the C5-6 level. Differential includes transverse myelitis versus demyelination. Multilevel spondylosis most prominent at the C5-7 levels, further detailed above. Electronically Signed   By: ChPrimitivo Gauze.D.   On: 12/03/2019 20:46   MR THORACIC SPINE W WO CONTRAST  Result Date: 12/04/2019 CLINICAL DATA:  Possible demyelination.  Decreased hand dexterity. EXAM: MRI THORACIC WITHOUT AND WITH CONTRAST TECHNIQUE: Multiplanar and multiecho pulse sequences of the thoracic spine were obtained without and with intravenous contrast. CONTRAST:  1061mADAVIST GADOBUTROL 1 MMOL/ML IV SOLN COMPARISON:  None. FINDINGS: MRI THORACIC SPINE FINDINGS Alignment:  Physiologic. Vertebrae: No fracture, evidence of discitis, or bone  lesion. Cord: Hyperintense T2-weighted signal within the cervical spine extends to the T1 level. No abnormal contrast enhancement. Paraspinal and other soft tissues: Negative. Disc levels: No disc herniation or spinal canal stenosis. IMPRESSION: No evidence of demyelinating disease within the thoracic spine. Electronically Signed   By: KevUlyses JarredD.   On: 12/04/2019 04:20   DG Fluoro Rm  1-60 Min - No Report  Result Date: 12/04/2019 CLINICAL DATA:  Request for lumbar puncture under fluoroscopic guidance. EXAM: DIAGNOSTIC LUMBAR PUNCTURE UNDER FLUOROSCOPIC GUIDANCE FLUOROSCOPY TIME:  Fluoroscopy Time: Not recorded (fluoroscopy unit malfunction) Radiation Exposure Index (if provided by the fluoroscopic device): None applicable Number of Acquired Spot Images: 0 PROCEDURE: Informed consent was obtained from the patient prior to the procedure, including potential complications of headache, allergy, and pain. With the patient prone, the lower back was prepped with Betadine. 1% Lidocaine was used for local anesthesia. Lumbar puncture was performed at the L3-4 and L2-3 levels using a 21 gauge needle with return of no fluid. The table malfunctioned during the procedure and procedure was terminated prior to obtaining fluid. IMPRESSION: Attempted lumbar puncture under fluoroscopic guidance. Fluid was not obtained for analysis. Procedure will be rescheduled for 12/05/2019. Electronically Signed   By: Nolon Nations M.D.   On: 12/04/2019 14:40   DG FL GUIDED LUMBAR PUNCTURE  Result Date: 12/05/2019 CLINICAL DATA:  Signal abnormalities in the cervical cord, query transverse myelitis or demyelinating process EXAM: DIAGNOSTIC LUMBAR PUNCTURE UNDER FLUOROSCOPIC GUIDANCE FLUOROSCOPY TIME:  Fluoroscopy Time:  0 minutes, 24 seconds Radiation Exposure Index (if provided by the fluoroscopic device): 5.4 mGy Number of Acquired Spot Images: 0 PROCEDURE: I discussed the risks (including hemorrhage, infection, and nerve  damage, among others), benefits, and alternatives to fluoroscopically guided lumbar puncture with the patient. We specifically discussed the high likelihood of technical success of the procedure. The patient understood and elected to undergo the procedure. Standard time-out was employed. Following sterile skin prep and local anesthetic administration consisting of 1 percent lidocaine, a 5 inch 20 gauge spinal needle was advanced without difficulty into the thecal sac at the L5-S1 level under fluoroscopic guidance. Clear CSF was returned. Opening pressure was 3 cm of water. A total of 12 cc of clear CSF was collected in 4 vials. The needle was subsequently removed and the skin cleansed and bandaged. No immediate complications were observed. IMPRESSION: 1. Technically successful fluoroscopically guided lumbar puncture at the L5-S1 level, yielding 12 cc of clear CSF. Electronically Signed   By: Van Clines M.D.   On: 12/05/2019 10:14    Labs: BNP (last 3 results) No results for input(s): BNP in the last 8760 hours. Basic Metabolic Panel: Recent Labs  Lab 12/03/19 1622 12/05/19 1142 12/08/19 0202 12/09/19 0555  NA 140 136 138 138  K 4.3 4.1 4.3 4.2  CL 104 100 105 106  CO2 _0 GLUCOSE 262* 430* 289* 151*  BUN 13 29* 37* 37*  CREATININE 1.16 1.18 1.28* 1.13  CALCIUM 9.7 10.2 9.9 9.6  MG  --  2.2  --   --    Liver Function Tests: Recent Labs  Lab 12/05/19 1142 12/07/19 0925 12/08/19 0202 12/09/19 0555  AST 58* 67* 56* 44*  ALT 151* 128* 140* 132*  ALKPHOS 71 80 76 65  BILITOT 1.0 1.4* 0.8 0.9  PROT 7.6 7.4 6.9 6.4*  ALBUMIN 4.0 3.9 3.6 3.4*   No results for input(s): LIPASE, AMYLASE in the last 168 hours. No results for input(s): AMMONIA in the last 168 hours. CBC: Recent Labs  Lab 12/03/19 1622 12/05/19 1142 12/08/19 0202 12/09/19 0555  WBC 13.2* 22.6* 22.0* 23.9*  NEUTROABS  --  21.2*  --   --   HGB 14.4 15.0 14.5 14.5  HCT 42.7 44.7 42.8 42.9  MCV 91.6  92.0 92.4 91.9  PLT 254 281 274 281  Cardiac Enzymes: No results for input(s): CKTOTAL, CKMB, CKMBINDEX, TROPONINI in the last 168 hours. BNP: Invalid input(s): POCBNP CBG: Recent Labs  Lab 12/08/19 1159 12/08/19 1641 12/08/19 2146 12/09/19 0756 12/09/19 1153  GLUCAP 406* 268* 119* 139* 105*   D-Dimer No results for input(s): DDIMER in the last 72 hours. Hgb A1c No results for input(s): HGBA1C in the last 72 hours. Lipid Profile No results for input(s): CHOL, HDL, LDLCALC, TRIG, CHOLHDL, LDLDIRECT in the last 72 hours. Thyroid function studies No results for input(s): TSH, T4TOTAL, T3FREE, THYROIDAB in the last 72 hours.  Invalid input(s): FREET3 Anemia work up No results for input(s): VITAMINB12, FOLATE, FERRITIN, TIBC, IRON, RETICCTPCT in the last 72 hours. Urinalysis    Component Value Date/Time   COLORURINE YELLOW 01/14/2017 0820   APPEARANCEUR CLEAR 01/14/2017 0820   LABSPEC 1.025 01/14/2017 0820   PHURINE 6.0 01/14/2017 0820   GLUCOSEU NEGATIVE 01/14/2017 0820   HGBUR NEGATIVE 01/14/2017 0820   BILIRUBINUR NEGATIVE 01/14/2017 0820   KETONESUR NEGATIVE 01/14/2017 0820   PROTEINUR NEGATIVE 01/14/2017 0820   NITRITE NEGATIVE 01/14/2017 0820   LEUKOCYTESUR NEGATIVE 01/14/2017 0820   Sepsis Labs Invalid input(s): PROCALCITONIN,  WBC,  LACTICIDVEN Microbiology Recent Results (from the past 240 hour(s))  Respiratory Panel by RT PCR (Flu A&B, Covid) - Nasopharyngeal Swab     Status: None   Collection Time: 12/04/19  6:17 AM   Specimen: Nasopharyngeal Swab  Result Value Ref Range Status   SARS Coronavirus 2 by RT PCR NEGATIVE NEGATIVE Final    Comment: (NOTE) SARS-CoV-2 target nucleic acids are NOT DETECTED.  The SARS-CoV-2 RNA is generally detectable in upper respiratoy specimens during the acute phase of infection. The lowest concentration of SARS-CoV-2 viral copies this assay can detect is 131 copies/mL. A negative result does not preclude  SARS-Cov-2 infection and should not be used as the sole basis for treatment or other patient management decisions. A negative result may occur with  improper specimen collection/handling, submission of specimen other than nasopharyngeal swab, presence of viral mutation(s) within the areas targeted by this assay, and inadequate number of viral copies (<131 copies/mL). A negative result must be combined with clinical observations, patient history, and epidemiological information. The expected result is Negative.  Fact Sheet for Patients:  PinkCheek.be  Fact Sheet for Healthcare Providers:  GravelBags.it  This test is no t yet approved or cleared by the [place] FDA and  has been authorized for detection and/or diagnosis of SARS-CoV-2 by FDA under an Emergency Use Authorization (EUA). This EUA will remain  in effect (meaning this test can be used) for the duration of the COVID-19 declaration under Section 564(b)(1) of the Act, 21 U.S.C. section 360bbb-3(b)(1), unless the authorization is terminated or revoked sooner.     Influenza A by PCR NEGATIVE NEGATIVE Final   Influenza B by PCR NEGATIVE NEGATIVE Final    Comment: (NOTE) The Xpert Xpress SARS-CoV-2/FLU/RSV assay is intended as an aid in  the diagnosis of influenza from Nasopharyngeal swab specimens and  should not be used as a sole basis for treatment. Nasal washings and  aspirates are unacceptable for Xpert Xpress SARS-CoV-2/FLU/RSV  testing.  Fact Sheet for Patients: PinkCheek.be  Fact Sheet for Healthcare Providers: GravelBags.it  This test is not yet approved or cleared by the [place] FDA and  has been authorized for detection and/or diagnosis of SARS-CoV-2 by  FDA under an Emergency Use Authorization (EUA). This EUA will remain  in effect (meaning this test can be  used) for the duration of the   Covid-19 declaration under Section 564(b)(1) of the Act, 21  U.S.C. section 360bbb-3(b)(1), unless the authorization is  terminated or revoked. Performed at [facility], [place] [address], [place], [place] [address]   Anaerobic culture     Status: None (Preliminary result)   Collection Time: 12/05/19  9:03 AM   Specimen: PATH Cytology CSF; Cerebrospinal Fluid  Result Value Ref Range Status   Specimen Description CYTO CSF  Final   Special Requests NONE  Final   Gram Stain   Final    NO WBC SEEN NO ORGANISMS SEEN Performed at [facility], [place] [address] [place], [place] [address]    Culture   Final    NO ANAEROBES ISOLATED; CULTURE IN PROGRESS FOR 5 DAYS   Report Status PENDING  Incomplete  CSF culture     Status: None   Collection Time: 12/05/19  9:03 AM   Specimen: PATH Cytology CSF; Cerebrospinal Fluid  Result Value Ref Range Status   Specimen Description CYTO CSF  Final   Special Requests NONE  Final   Gram Stain   Final    WBC PRESENT, PREDOMINANTLY MONONUCLEAR NO ORGANISMS SEEN CYTOSPIN SMEAR    Culture   Final    NO GROWTH 3 DAYS Performed at [facility], [place] [address], [place], [place] [address]    Report Status 12/08/2019 FINAL  Final  Fungus Culture With Stain     Status: None (Preliminary result)   Collection Time: 12/05/19  9:03 AM   Specimen: PATH Cytology CSF; Cerebrospinal Fluid  Result Value Ref Range Status   Fungus Stain Final report  Final    Comment: (NOTE) Performed At: [facility] [place], [place] [id number] Rush Farmer MD ID:[id number]    Fungus (Mycology) Culture PENDING  Incomplete   Fungal Source CSF  Final    Comment: Performed at [facility], [place] [address], [place], [place] [address]  Hsv Culture And Typing     Status: None   Collection Time: 12/05/19  9:03 AM   Specimen: PATH Cytology CSF; Cerebrospinal Fluid  Result Value Ref Range Status   HSV Culture/Type Comment  Final     Comment: (NOTE) Negative No Herpes simplex virus isolated. Performed At: [facility] [place], [place] [id number] Rush Farmer MD WS:[id number]    Source of Sample CSF  Final    Comment: Performed at [facility], [place] [address] [place], [place] [address]  Fungus Culture Result     Status: None   Collection Time: 12/05/19  9:03 AM  Result Value Ref Range Status   Result 1 Comment  Final    Comment: (NOTE) KOH/Calcofluor preparation:  no fungus observed. Performed At: [facility] [place], [place] [id number] Rush Farmer MD VH:[id number]      Time coordinating discharge: 25 minutes  SIGNED: Antonieta Pert, MD  [facility] 12/09/2019, 2:24 PM  If 7PM-7AM, please contact night-coverage [email or web address]

## 2019-12-09 NOTE — Progress Notes (Signed)
Report given to [facility], Charity fundraiser.  Patient is alert and oriented x4 sitting up in bed with call light in reach. VSS. Wife at bedside. Patient cleared for DC today by neuro and will follow up outpatient. Outpatient OT also recommended. WBC 23.9, Hgb 14.5, platelets 281, K+ 4.2 and Creat 1.13 this AM.

## 2019-12-09 NOTE — Progress Notes (Signed)
Occupational Therapy Treatment Patient Details Name: Ivan Jones MRN: [id number] DOB: 05-13-1975 Today's Date: 12/09/2019    History of present illness Pt adm with bilateral hand numbness and weakness. Pt found to have transverse myelitis. Pt began treatment with steroids. PMH - DM, HTN, arthritis, Rt TKR, recent panic attack.    OT comments  Pt seen for OT follow up session with focus on BUE hand strength. LUE remains weaker than R but is improved from previous session. Educated pt and wife on typical disease process/therapy process with transverse myelitis. Pt performed exercises as listed below. Educated pt on safe compensations and activity recommendations for d/c home. Will continue to follow acutely for strengthening.   Follow Up Recommendations  Outpatient OT (neuro)    Equipment Recommendations  None recommended by OT    Recommendations for Other Services      Precautions / Restrictions Precautions Precautions: None Restrictions Weight Bearing Restrictions: No       Mobility Bed Mobility Overal bed mobility: Independent                Transfers Overall transfer level: Modified independent Equipment used: None                  Balance Overall balance assessment: Independent                                         ADL either performed or assessed with clinical judgement   ADL                                         General ADL Comments: session focused on [facility] HEP for bil hands     Vision Patient Visual Report: No change from baseline     Perception     Praxis      Cognition Arousal/Alertness: Awake/alert Behavior During Therapy: WFL for tasks assessed/performed Overall Cognitive Status: Within Functional Limits for tasks assessed                                          Exercises Other Exercises Other Exercises: L hand: x10 single digit lifts, finger opposition, finger ab/adduction    Shoulder Instructions       General Comments      Pertinent Vitals/ Pain       Pain Assessment: Faces Faces Pain Scale: Hurts a little bit Pain Location: uncomfortable tingling Pain Descriptors / Indicators: Tingling Pain Intervention(s): Monitored during session  Home Living                                          Prior Functioning/Environment              Frequency  Min 3X/week        Progress Toward Goals  OT Goals(current goals can now be found in the care plan section)  Progress towards OT goals: Progressing toward goals  Acute Rehab OT Goals Patient Stated Goal: regain independence OT Goal Formulation: With patient Time For Goal Achievement: 12/22/19 Potential to Achieve Goals: Good  Plan Discharge plan remains appropriate  Co-evaluation                 AM-PAC OT "6 Clicks" Daily Activity     Outcome Measure   Help from another person eating meals?: None Help from another person taking care of personal grooming?: None Help from another person toileting, which includes using toliet, bedpan, or urinal?: A Little Help from another person bathing (including washing, rinsing, drying)?: A Little Help from another person to put on and taking off regular upper body clothing?: None Help from another person to put on and taking off regular lower body clothing?: A Little 6 Click Score: 21    End of Session    OT Visit Diagnosis: Muscle weakness (generalized) (M62.81);Other symptoms and signs involving the nervous system (R29.898)   Activity Tolerance Patient tolerated treatment well   Patient Left in chair;with call bell/phone within reach   Nurse Communication Mobility status        Time: 0959-1010 OT Time Calculation (min): 11 min  Charges: OT General Charges $OT Visit: 1 Visit OT Treatments $Therapeutic Exercise: 8-22 mins  Dalphine Handing, MSOT, OTR/L Acute Rehabilitation Services [facility] Office Number:  [phone] Pager: [phone]  Dalphine Handing 12/09/2019, 3:23 PM

## 2019-12-10 LAB — ANAEROBIC CULTURE: Gram Stain: NONE SEEN

## 2019-12-13 DIAGNOSIS — Z6835 Body mass index (BMI) 35.0-35.9, adult: Secondary | ICD-10-CM | POA: Diagnosis not present

## 2019-12-13 DIAGNOSIS — Z09 Encounter for follow-up examination after completed treatment for conditions other than malignant neoplasm: Secondary | ICD-10-CM | POA: Diagnosis not present

## 2019-12-13 DIAGNOSIS — G373 Acute transverse myelitis in demyelinating disease of central nervous system: Secondary | ICD-10-CM | POA: Diagnosis not present

## 2019-12-13 DIAGNOSIS — F411 Generalized anxiety disorder: Secondary | ICD-10-CM | POA: Diagnosis not present

## 2019-12-21 LAB — MISC LABCORP TEST (SEND OUT): Labcorp test code: 9985

## 2020-01-02 ENCOUNTER — Encounter: Payer: Self-pay | Admitting: *Deleted

## 2020-01-02 ENCOUNTER — Telehealth: Payer: Self-pay | Admitting: Neurology

## 2020-01-02 NOTE — Telephone Encounter (Signed)
Pt's wife, Jermane Brayboy called, husband is out of work. Can he be seen any earlier? He is not able to go back to work until he is seen. But we need a earlier appt so he can get back to work. Can you work him in?  Would like a call from the nurse.

## 2020-01-02 NOTE — Telephone Encounter (Signed)
Noted, thank you

## 2020-01-02 NOTE — Telephone Encounter (Signed)
He's rescheduled for Wednesday 12/1 at 9:00

## 2020-01-02 NOTE — Telephone Encounter (Signed)
This is a new pt. Can one of you call and offer MSNP slot this week for sooner appt? Thank you!

## 2020-01-04 ENCOUNTER — Encounter: Payer: Self-pay | Admitting: Neurology

## 2020-01-04 ENCOUNTER — Ambulatory Visit: Payer: BC Managed Care – PPO | Admitting: Neurology

## 2020-01-04 ENCOUNTER — Telehealth: Payer: Self-pay | Admitting: *Deleted

## 2020-01-04 VITALS — BP 119/78 | HR 85 | Ht 70.0 in | Wt 239.5 lb

## 2020-01-04 DIAGNOSIS — G373 Acute transverse myelitis in demyelinating disease of central nervous system: Secondary | ICD-10-CM | POA: Diagnosis not present

## 2020-01-04 DIAGNOSIS — R29898 Other symptoms and signs involving the musculoskeletal system: Secondary | ICD-10-CM

## 2020-01-04 DIAGNOSIS — R200 Anesthesia of skin: Secondary | ICD-10-CM

## 2020-01-04 DIAGNOSIS — E119 Type 2 diabetes mellitus without complications: Secondary | ICD-10-CM

## 2020-01-04 LAB — FUNGUS CULTURE WITH STAIN

## 2020-01-04 LAB — FUNGAL ORGANISM REFLEX

## 2020-01-04 LAB — FUNGUS CULTURE RESULT

## 2020-01-04 NOTE — Telephone Encounter (Signed)
Sent completed/signed [employer] Critical illness insurance claim form to be scanned to pt chart. Completed copy given to pt by MD while here for appt.  Submitted [facility] lab orders for NMO/MOG (dx: neuromyelitis optica). Pt/wife given billing pamphlet info and order form. They are going to wait to see what cost will be prior to getting lab drawn.

## 2020-01-04 NOTE — Progress Notes (Signed)
[facility]  PATIENT: Ivan Jones DOB: 1975/12/22  REFERRING DOCTOR OR PCP: Dr. Helene Kelp SOURCE: Patient, notes from hospitalization, imaging and lab reports, MRI images personally reviewed.  _________________________________   HISTORICAL  CHIEF COMPLAINT:  Chief Complaint  Patient presents with  . New Patient (Initial Visit)    RM 13, with wife. Internal referral from [facility] for transverse myelitis. Able to put socks on now. Not as strong on right side. Left hand still weak. Has numb/ting in left more than right hand. Has not done PT.   . Gait Problem    Ambulates with cane.     HISTORY OF PRESENT ILLNESS:  I had the pleasure of seeing patient, Ivan Jones, at [facility] for neurologic consultation regarding his recent transverse myelitis and continued neurologic symptoms.  He is a 44 year old man who had pain between the shoulder blades and pain radiated to the chest on 12/02/2019.   He went to the [facility] ED, concerned about an MI.   He was evaluated and released.       The next day, 12/03/2019, he had the onset of severe pain in the shoulder blade region again.   Over the next 2 hours the hands became weaker and he was unable to open a door aor hold items.   He went to the [facility] one ED.  Duringt he wait, he began to have more troublle with his legs.   He had numbness in the abdomen and tingling and mild weakness in his legs.   He felt off balanced.  At his worse, he had numbness form the left shoulder down and from the right upper chest down.     He was admitted and recevied 5 days of IV Solu-Medrol (1000 mg daily).    He felt some improvement and was walking better, feeding self but still having numbness and mild weaknes in his hands.  Legs were improved though he still needed a cane.     Since discharge, he notes  Continued slow improvement.    He has a better grip.   The left hand is weaker than his right.  He is right handed and  feels his handwriting is slower and not as neat.      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   He mostly talks but needs to drive and use his hands some, especially with keyboarding.     He has no recent neck trauma.   He has Type 2 DM and is on Janumet and insulin prn.   Sugars were elevated during steroid therapy and he had insomnia.    He has gained some weight during the pandemic.      He had Covid-19 diagnosed October 05, 2019.    Imaging studies reviewed: MRI of the cervical spine 12/03/2019 showed several T2 hyperintense foci within the spinal cord.  Small foci are noted at C3, C3-C4 and C5-C6 and a larger focus is noted from C5-C6 through T1.  There are degenerative changes.  At C5-C6 there is spinal stenosis and foraminal narrowing but no nerve root compression.  At C6-7, there is mild spinal stenosis and bilateral foraminal narrowing.  Moderate degenerative changes at other levels that do not lead to spinal stenosis or nerve root compression.  MRI of the brain 12/04/2019 was normal.  MRI of the thoracic spine 12/04/2019 was normal.  Pertinent laboratory results: CSF 12/05/2019 showed normal protein and glucose and cell count.  VDRL was  negative.  No oligoclonal bands were present.  Labs 12/03/2019 through 12/06/2019: HIV is negative.  Arboviral profile was negative.  HSV type I and II are negative.  Hemoglobin A1c 9.0.  Glucoses were elevated generally above 200 while getting steroids.  REVIEW OF SYSTEMS: Constitutional: No fevers, chills, sweats, or change in appetite Eyes: No visual changes, double vision, eye pain Ear, nose and throat: No hearing loss, ear pain, nasal congestion, sore throat Cardiovascular: No chest pain, palpitations Respiratory: No shortness of breath at rest or with exertion.   No wheezes GastrointestinaI: No nausea, vomiting, diarrhea, abdominal pain, fecal incontinence Genitourinary: No dysuria, urinary retention or frequency.  No  nocturia. Musculoskeletal: No neck pain, back pain Integumentary: No rash, pruritus, skin lesions Neurological: as above Psychiatric: No depression at this time.  No anxiety Endocrine: No palpitations, diaphoresis, change in appetite, change in weigh or increased thirst Hematologic/Lymphatic: No anemia, purpura, petechiae. Allergic/Immunologic: No itchy/runny eyes, nasal congestion, recent allergic reactions, rashes  ALLERGIES: No Known Allergies  HOME MEDICATIONS:  Current Outpatient Medications:  .  aspirin EC 325 MG tablet, Take 1 tablet (325 mg total) by mouth 2 (two) times daily., Disp: 60 tablet, Rfl: 1 .  blood glucose meter kit and supplies KIT, Dispense based on patient and insurance preference. Use up to four times daily as directed. (FOR ICD-9 250.00, 250.01)., Disp: 1 each, Rfl: 0 .  Insulin Pen Needle (PEN NEEDLES 3/16") 31G X 5 MM MISC, Use tid with insulin inj, Disp: 30 each, Rfl: 0 .  irbesartan (AVAPRO) 75 MG tablet, Take 75 mg by mouth daily., Disp: , Rfl:  .  JANUMET XR 50-1000 MG TB24, Take 1 tablet by mouth 2 (two) times daily., Disp: , Rfl: 1 .  meloxicam (MOBIC) 15 MG tablet, Take 15 mg by mouth daily., Disp: , Rfl:  .  oxyCODONE-acetaminophen (PERCOCET) 7.5-325 MG tablet, Take 1 tablet by mouth every 6 (six) hours as needed for moderate pain. , Disp: , Rfl:  .  insulin aspart (NOVOLOG FLEXPEN) 100 UNIT/ML FlexPen, CBG 70 - 120:0 units CBG 121 - 150:0 unit CBG 151 - 200:1 units CBG 201 - 250: 2 units CBG 251 - 300: 4 units CBG 301 - 350: 6 units CBG 351 - 400: 7 units > 400 call you MD, Disp: 15 mL, Rfl: 0  PAST MEDICAL HISTORY: Past Medical History:  Diagnosis Date  . Arthritis    knees and shoulder  . Complication of anesthesia    wakes up to fast   . Diabetes mellitus without complication ([place])    type 2  . History of kidney stones   . Hypertension     PAST SURGICAL HISTORY: Past Surgical History:  Procedure Laterality Date  . COLON SURGERY      diverticulitis 15 inches rescected  . KNEE ARTHROSCOPY     3 times  . TOTAL KNEE ARTHROPLASTY Right 01/15/2017   Procedure: RIGHT TOTAL KNEE ARTHROPLASTY;  Surgeon: Susa Day, MD;  Location: [facility];  Service: Orthopedics;  Laterality: Right;  2 hrs    FAMILY HISTORY: Family History  Problem Relation Age of Onset  . Heart attack Mother   . Pancreatitis Mother   . Cancer Father        kidney  . Diabetes Father   . High blood pressure Father     SOCIAL HISTORY:  Social History   Socioeconomic History  . Marital status: Married    Spouse name: Not on file  . Number of children: Not  on file  . Years of education: Not on file  . Highest education level: Not on file  Occupational History  . Not on file  Tobacco Use  . Smoking status: Never Smoker  . Smokeless tobacco: Never Used  Vaping Use  . Vaping Use: Never used  Substance and Sexual Activity  . Alcohol use: Not Currently  . Drug use: No  . Sexual activity: Yes  Other Topics Concern  . Not on file  Social History Narrative   Right handed   Caffeine use: Diet MT Dew, Diet drinks   Lives with wife   Social Determinants of Health   Financial Resource Strain:   . Difficulty of Paying Living Expenses: Not on file  Food Insecurity:   . Worried About Charity fundraiser in the Last Year: Not on file  . Ran Out of Food in the Last Year: Not on file  Transportation Needs:   . Lack of Transportation (Medical): Not on file  . Lack of Transportation (Non-Medical): Not on file  Physical Activity:   . Days of Exercise per Week: Not on file  . Minutes of Exercise per Session: Not on file  Stress:   . Feeling of Stress : Not on file  Social Connections:   . Frequency of Communication with Friends and Family: Not on file  . Frequency of Social Gatherings with Friends and Family: Not on file  . Attends Religious Services: Not on file  . Active Member of Clubs or Organizations: Not on file  . Attends Theatre manager Meetings: Not on file  . Marital Status: Not on file  Intimate Partner Violence:   . Fear of Current or Ex-Partner: Not on file  . Emotionally Abused: Not on file  . Physically Abused: Not on file  . Sexually Abused: Not on file     PHYSICAL EXAM  Vitals:   01/04/20 0847  BP: 119/78  Pulse: 85  SpO2: 98%  Weight: 239 lb 8 oz (108.6 kg)  Height: '5\' 10"'  (1.778 m)    Body mass index is 34.36 kg/m.   General: The patient is well-developed and well-nourished and in no acute distress  HEENT:  Head is [place]/AT.  Sclera are anicteric.  Funduscopic exam shows normal optic discs and retinal vessels.  Neck: No carotid bruits are noted.  The neck is nontender.  Cardiovascular: The heart has a regular rate and rhythm with a normal S1 and S2. There were no murmurs, gallops or rubs.    Skin: Extremities are without rash or  edema.  Musculoskeletal:  Back is nontender  Neurologic Exam  Mental status: The patient is alert and oriented x 3 at the time of the examination. The patient has apparent normal recent and remote memory, with an apparently normal attention span and concentration ability.   Speech is normal.  Cranial nerves: Extraocular movements are full. Pupils are equal, round, and reactive to light and accomodation.  Visual fields are full.  Facial symmetry is present. There is good facial sensation to soft touch bilaterally.Facial strength is normal.  Trapezius and sternocleidomastoid strength is normal. No dysarthria is noted.  The tongue is midline, and the patient has symmetric elevation of the soft palate. No obvious hearing deficits are noted.  Motor:  Muscle bulk is normal.   Tone is normal. Strength is  5 / 5 in proximal arms and legs but 4/5 in left C7 and 4-/5 in leftC8/T1 innervated muscles and 4/5 in right C8/T1 innervated  hand/forearm muscles.    Sensory: Sensory testing is intact to vibration sensation in all 4 extremities.   He has reduced temperature  sensation from C6 and below left and C7   Coordination: Cerebellar testing reveals good finger-nose-finger and heel-to-shin bilaterally.  Gait and station: Station is normal.   Gait is normal. Tandem gait is wide. Romberg is negative.   Reflexes: Deep tendon reflexes are symmetric and normal bilaterally.   Plantar responses are flexor.    DIAGNOSTIC DATA (LABS, IMAGING, TESTING) - I reviewed patient records, labs, notes, testing and imaging myself where available.  Lab Results  Component Value Date   WBC 23.9 (H) 12/09/2019   HGB 14.5 12/09/2019   HCT 42.9 12/09/2019   MCV 91.9 12/09/2019   PLT 281 12/09/2019      Component Value Date/Time   NA 138 12/09/2019 0555   K 4.2 12/09/2019 0555   CL 106 12/09/2019 0555   CO2 22 12/09/2019 0555   GLUCOSE 151 (H) 12/09/2019 0555   BUN 37 (H) 12/09/2019 0555   CREATININE 1.13 12/09/2019 0555   CALCIUM 9.6 12/09/2019 0555   PROT 6.4 (L) 12/09/2019 0555   ALBUMIN 3.4 (L) 12/09/2019 0555   AST 44 (H) 12/09/2019 0555   ALT 132 (H) 12/09/2019 0555   ALKPHOS 65 12/09/2019 0555   BILITOT 0.9 12/09/2019 0555   GFRNONAA >60 12/09/2019 0555   GFRAA >60 01/16/2017 0536   No results found for: CHOL, HDL, LDLCALC, LDLDIRECT, TRIG, CHOLHDL Lab Results  Component Value Date   HGBA1C 9.0 (H) 12/05/2019   No results found for: VITAMINB12 Lab Results  Component Value Date   TSH 0.264 (L) 12/05/2019       ASSESSMENT AND PLAN  Transverse myelitis ([place]) - Plan: Ambulatory referral to Occupational Therapy  Hand weakness - Plan: Ambulatory referral to Occupational Therapy  Numbness - Plan: Ambulatory referral to Occupational Therapy   In summary, Ivan Jones is a 44 year old man who developed a transverse myelitis towards the end of October 2020.  I discussed with him and his wife that there are multiple possible etiologies.  This did occur about 7 weeks after COVID-19 and he could have a postinfectious transverse myelitis.  This might be  the best possibility as this would be expected to be nonrelapsing.  The dominant cervical spine lesion spanned 3 vertebral bodies and neuromyelitis optica is another possibility.  We will check anti-NMO and anti-MOG FACS ([facility]).  Additionally we will check for vasculitis and check ANA, ANCA, ESR/CRP.  An infectious etiology is unlikely but I will check HTLV 1/2.  If he does have neuromyelitis optica, consider Rituxan or other treatment.  Will order occupational therapy to help with coordination hands.  I wrote a note excusing from work until December 20 to give more time for improvement.  He should continue to stay active and exercise as tolerated.  He will return to see me in 3 months or sooner depending the results of the studies or if he has new or worsening neurologic symptoms.   Ivan Wahlert A. Felecia Shelling, MD, [facility] 29/10/3714, 9:67 AM Certified in Neurology, Clinical Neurophysiology, Sleep Medicine and Neuroimaging  [facility] [address], [place], [place] [address] [phone]

## 2020-01-06 LAB — PAN-ANCA
ANCA Proteinase 3: <3.5 U/mL (ref 0.0–3.5)
Atypical pANCA: <1:20 {titer}
C-ANCA: <1:20 {titer}
Myeloperoxidase Ab: <9 U/mL (ref 0.0–9.0)
P-ANCA: <1:20 {titer}

## 2020-01-06 LAB — SEDIMENTATION RATE: Sed Rate: 21 mm/hr — ABNORMAL HIGH (ref 0–15)

## 2020-01-06 LAB — C-REACTIVE PROTEIN: CRP: 4 mg/L (ref 0–10)

## 2020-01-06 LAB — HEPATITIS C ANTIBODY: Hep C Virus Ab: 0.2 s/co ratio (ref 0.0–0.9)

## 2020-01-06 LAB — HTLV I+II ANTIBODIES, (EIA), BLD: HTLV I/II Ab: NEGATIVE

## 2020-01-06 LAB — ANA W/REFLEX: Anti Nuclear Antibody (ANA): NEGATIVE

## 2020-01-09 ENCOUNTER — Other Ambulatory Visit: Payer: Self-pay

## 2020-01-09 ENCOUNTER — Telehealth: Payer: Self-pay | Admitting: *Deleted

## 2020-01-09 ENCOUNTER — Encounter: Payer: Self-pay | Admitting: Registered"

## 2020-01-09 ENCOUNTER — Encounter: Payer: BC Managed Care – PPO | Attending: Internal Medicine | Admitting: Registered"

## 2020-01-09 DIAGNOSIS — E119 Type 2 diabetes mellitus without complications: Secondary | ICD-10-CM

## 2020-01-09 NOTE — Patient Instructions (Addendum)
Savor the flavor of your food - eat more mindfully. Continue getting plenty of fluid Get daily movement as tolerated Arm chair exercises are an option Aim to eat balanced meals and snacks. Getting protein in the morning is good Continue with eating smaller dinners and not too late Stress management and getting better quality sleep.

## 2020-01-09 NOTE — Progress Notes (Signed)
Diabetes Self-Management Education  Visit Type: First/Initial  Appt. Start Time: 1415 Appt. End Time: 1510  01/09/2020  Mr. Scharlene Gloss, identified by name and date of birth, is a 44 y.o. male with a diagnosis of Diabetes: Type 2.   ASSESSMENT  There were no vitals taken for this visit. There is no height or weight on file to calculate BMI.   A1c 9% during hospital encounter 12/05/19.   Prior History of present condition: Pt states in 201 he was almost 300 lbs when diagnosed, drinking soda and eating cookies regularly. Pt states he changed his diet and lifestyle, was playing softball regularly. Pt states he was able to get A1c into the 6% range and felt confident he could manage his diabetes. Pt states since COVID he became less active and getting too many sweets in his diet.   Pt reports recent hospital encounter for transverse myelitis and states he was told it will take about 6 months for him to recover. Patient is ambulating with cane and states he has episodes of feeling off balance.   Stress: Patient states he has recently lost 30 lbs due to stress in his life including death in the family and building a new home. Pt states he has been living in a camper with wife and 86 year old son and doesn't get enough sleep. Pt reports that he will start moving furniture into new home tomorrow and looking forward to getting better sleep.    Medication: Pt states he was receiving high doses of steroids that was making his blood sugar reach the 400s and states that is what prompted him to be sent home with Novolog. Pt states he continues to use slide scale as needed and does not have to inject insulin very often.   SMBG: Pt states he has tried both [place] and [address]. Pt reports recently his highest blood sugar was 270 but usually stays in the 115-170 mg/dL range. Pt reports yesterday was 132 mg/dL before lunch.  Pt states for last 6 weeks he watches his intake, minimal starch and only 1 cookie  instead of a sleeve. Pt states although he is mindful not to eat too late in the day, he does not eat mindfully.    Diabetes Self-Management Education - 01/09/20 1426      Visit Information   Visit Type First/Initial      Initial Visit   Diabetes Type Type 2    Are you currently following a meal plan? Yes    What type of meal plan do you follow? eats a lot better    Are you taking your medications as prescribed? --   insulin as neededd   Date Diagnosed 2011      Health Coping   How would you rate your overall health? Good      Psychosocial Assessment   Patient Belief/Attitude about Diabetes Motivated to manage diabetes    How often do you need to have someone help you when you read instructions, pamphlets, or other written materials from your doctor or pharmacy? 1 - Never    What is the last grade level you completed in school? 2 yrs college      Complications   Last HgB A1C per patient/outside source 9 %    How often do you check your blood sugar? > 4 times/day    Number of hypoglycemic episodes per month 0    Have you had a dilated eye exam in the past 12 months? No  2 yrs ago   Have you had a dental exam in the past 12 months? No    Are you checking your feet? Yes    How many days per week are you checking your feet? 3      Dietary Intake   Breakfast protein bar (for weight loss) OR orange    Snack (morning) none    Lunch salad, protein    Snack (afternoon) none OR almonds    Dinner Chicken, [place] or fish, limit starch, vegetable daily    Snack (evening) 1 cookie    Beverage(s) diet mountain dew, flavored water, unsweet tea      Exercise   Exercise Type Light (walking / raking leaves)    How many days per week to you exercise? 7    How many minutes per day do you exercise? 10    Total minutes per week of exercise 70      Patient Education   Previous Diabetes Education Yes (please comment)    Nutrition management  Role of diet in the treatment of diabetes and the  relationship between the three main macronutrients and blood glucose level    Physical activity and exercise  Role of exercise on diabetes management, blood pressure control and cardiac health.;Helped patient identify appropriate exercises in relation to his/her diabetes, diabetes complications and other health issue.    Medications Reviewed patients medication for diabetes, action, purpose, timing of dose and side effects.    Monitoring Identified appropriate SMBG and/or A1C goals.    Psychosocial adjustment Role of stress on diabetes      Individualized Goals (developed by patient)   Nutrition General guidelines for healthy choices and portions discussed    Physical Activity Exercise 3-5 times per week      Outcomes   Expected Outcomes Demonstrated interest in learning. Expect positive outcomes    Future DMSE PRN    Program Status Completed           Individualized Plan for Diabetes Self-Management Training:   Learning Objective:  Patient will have a greater understanding of diabetes self-management. Patient education plan is to attend individual and/or group sessions per assessed needs and concerns.   Patient Instructions  Savor the flavor of your food - eat more mindfully. Continue getting plenty of fluid Get daily movement as tolerated Arm chair exercises are an option Aim to eat balanced meals and snacks. Getting protein in the morning is good Continue with eating smaller dinners and not too late Stress management and getting better quality sleep.    Expected Outcomes:  Demonstrated interest in learning. Expect positive outcomes  Education material provided: Planning healthy Meals, A1c chart  If problems or questions, patient to contact team via:  Phone  Future DSME appointment: PRN

## 2020-01-09 NOTE — Telephone Encounter (Signed)
Called and LVM for pt about lab results per Dr. Epimenio Foot note. Advised him to let us know once he gets [facility] lab drawn and to make sure to bring order form with him day of draw. Advised him to call back if he has any further questions/concerns.

## 2020-01-09 NOTE — Telephone Encounter (Signed)
-----   Message from Asa Lente, MD sent at 01/07/2020 11:14 AM EST ----- Please let the patient know that the lab work is fine.  The one lab (sed rate - a measure of inflammation) was slightly elevated but not enough to be concerned about

## 2020-01-18 DIAGNOSIS — R2689 Other abnormalities of gait and mobility: Secondary | ICD-10-CM | POA: Diagnosis not present

## 2020-01-18 DIAGNOSIS — M62521 Muscle wasting and atrophy, not elsewhere classified, right upper arm: Secondary | ICD-10-CM | POA: Diagnosis not present

## 2020-01-18 DIAGNOSIS — M62522 Muscle wasting and atrophy, not elsewhere classified, left upper arm: Secondary | ICD-10-CM | POA: Diagnosis not present

## 2020-01-18 DIAGNOSIS — G373 Acute transverse myelitis in demyelinating disease of central nervous system: Secondary | ICD-10-CM | POA: Diagnosis not present

## 2020-01-20 DIAGNOSIS — M62521 Muscle wasting and atrophy, not elsewhere classified, right upper arm: Secondary | ICD-10-CM | POA: Diagnosis not present

## 2020-01-20 DIAGNOSIS — G373 Acute transverse myelitis in demyelinating disease of central nervous system: Secondary | ICD-10-CM | POA: Diagnosis not present

## 2020-01-20 DIAGNOSIS — R2689 Other abnormalities of gait and mobility: Secondary | ICD-10-CM | POA: Diagnosis not present

## 2020-01-20 DIAGNOSIS — M62522 Muscle wasting and atrophy, not elsewhere classified, left upper arm: Secondary | ICD-10-CM | POA: Diagnosis not present

## 2020-01-25 DIAGNOSIS — M62522 Muscle wasting and atrophy, not elsewhere classified, left upper arm: Secondary | ICD-10-CM | POA: Diagnosis not present

## 2020-01-25 DIAGNOSIS — G373 Acute transverse myelitis in demyelinating disease of central nervous system: Secondary | ICD-10-CM | POA: Diagnosis not present

## 2020-01-25 DIAGNOSIS — R2689 Other abnormalities of gait and mobility: Secondary | ICD-10-CM | POA: Diagnosis not present

## 2020-01-25 DIAGNOSIS — M62521 Muscle wasting and atrophy, not elsewhere classified, right upper arm: Secondary | ICD-10-CM | POA: Diagnosis not present

## 2020-01-31 DIAGNOSIS — G373 Acute transverse myelitis in demyelinating disease of central nervous system: Secondary | ICD-10-CM | POA: Diagnosis not present

## 2020-01-31 DIAGNOSIS — R2689 Other abnormalities of gait and mobility: Secondary | ICD-10-CM | POA: Diagnosis not present

## 2020-01-31 DIAGNOSIS — M62521 Muscle wasting and atrophy, not elsewhere classified, right upper arm: Secondary | ICD-10-CM | POA: Diagnosis not present

## 2020-01-31 DIAGNOSIS — M62522 Muscle wasting and atrophy, not elsewhere classified, left upper arm: Secondary | ICD-10-CM | POA: Diagnosis not present

## 2020-02-02 DIAGNOSIS — M62522 Muscle wasting and atrophy, not elsewhere classified, left upper arm: Secondary | ICD-10-CM | POA: Diagnosis not present

## 2020-02-02 DIAGNOSIS — M62521 Muscle wasting and atrophy, not elsewhere classified, right upper arm: Secondary | ICD-10-CM | POA: Diagnosis not present

## 2020-02-02 DIAGNOSIS — G373 Acute transverse myelitis in demyelinating disease of central nervous system: Secondary | ICD-10-CM | POA: Diagnosis not present

## 2020-02-02 DIAGNOSIS — R2689 Other abnormalities of gait and mobility: Secondary | ICD-10-CM | POA: Diagnosis not present

## 2020-02-07 DIAGNOSIS — M62521 Muscle wasting and atrophy, not elsewhere classified, right upper arm: Secondary | ICD-10-CM | POA: Diagnosis not present

## 2020-02-07 DIAGNOSIS — G373 Acute transverse myelitis in demyelinating disease of central nervous system: Secondary | ICD-10-CM | POA: Diagnosis not present

## 2020-02-07 DIAGNOSIS — R2689 Other abnormalities of gait and mobility: Secondary | ICD-10-CM | POA: Diagnosis not present

## 2020-02-07 DIAGNOSIS — M62522 Muscle wasting and atrophy, not elsewhere classified, left upper arm: Secondary | ICD-10-CM | POA: Diagnosis not present

## 2020-02-09 ENCOUNTER — Telehealth: Payer: Self-pay | Admitting: *Deleted

## 2020-02-09 NOTE — Telephone Encounter (Signed)
LVM for pt to call office back to provide update as to whether he has had [facility] labs drawn that were ordered by MD

## 2020-02-16 DIAGNOSIS — Z23 Encounter for immunization: Secondary | ICD-10-CM | POA: Diagnosis not present

## 2020-02-27 ENCOUNTER — Ambulatory Visit: Payer: BC Managed Care – PPO | Admitting: Neurology

## 2020-02-28 NOTE — Telephone Encounter (Signed)
[employer]. She states lab Dr. Epimenio Foot ordered via [facility] denied via insurance. She spoke with [facility] who said MD can do pre-approval letter to try and get approval. Without approval, it will cost them about 1500.00 dollars. Advised I will speak with MD and call back with update once I know more.

## 2020-02-28 NOTE — Telephone Encounter (Signed)
Pt's wife(on DPR) is asking for a call from RN to discuss needed pre auth for blood work that pt needs, please call

## 2020-02-29 NOTE — Telephone Encounter (Signed)
Faxed letter to [facility] PA department to fax # below. Received fax confirmation.

## 2020-02-29 NOTE — Telephone Encounter (Signed)
Letter written, waiting on MD signature. Also emailed rep with [facility] to see who we should send letter to for review. Waiting on response.

## 2020-02-29 NOTE — Telephone Encounter (Signed)
Called wife back. She would like pre-approval letter mailed to them since they live in [place]. I verified address on file. Placed letter in mail.

## 2020-02-29 NOTE — Telephone Encounter (Signed)
Received response from rep:  "I was able to verify with the billing office that the patient's insurance company would need that letter so they can go ahead and grant the approval for the test.  We recommend also sending [place] a copy of the that letter so we have it on file in the patient's record.  The fax number for that department is:[phone]  Attn: Prior Authorization"  I reached back out to clarify where we need to send letter for insurance to review. She responded with:   "The patient will need to provide the form to their insurance company as [employer] is requesting it.  That is the most important.  Faxing it to [place] will be helpful so we can keep in on file in case it is needed later on for any reason."

## 2020-03-30 DIAGNOSIS — M25511 Pain in right shoulder: Secondary | ICD-10-CM | POA: Diagnosis not present

## 2020-04-11 NOTE — Telephone Encounter (Signed)
Sent message to [facility] rep: Marlowe Kays to get update on pt order/status. Waiting on response.

## 2020-04-16 DIAGNOSIS — J329 Chronic sinusitis, unspecified: Secondary | ICD-10-CM | POA: Diagnosis not present

## 2020-04-23 NOTE — Telephone Encounter (Signed)
I called Amber back. [facility] now saying we have to submit letter directly from our office to insurance. She states she has been calling [phone]. Advised I will call again tomorrow morning to see if I can figure out more info as to where we need to send letter to try and get labs approved. Advised I will call back once I get more info. She verbalized understanding and appreciation.

## 2020-04-23 NOTE — Telephone Encounter (Signed)
Received the following message back: "On the patient's insurance card it should indicate where the prior authorization should be sent to. Many insurance companies will not allow a patient or the performing lab to request a prior authorization for services being requestioned from a physicians office. I apologies if there was a misunderstanding. Our office can have a financial conversation with the patients and provide all the necessary information to validate coverage. We are however unable to submit prior authorizations."

## 2020-04-23 NOTE — Telephone Encounter (Signed)
Sent message to [facility] to see what next steps would be, waiting on response

## 2020-04-23 NOTE — Telephone Encounter (Signed)
Pt.'s wife Joice Lofts states that insurance needs doctor to start a claim for blood work. Please advise.

## 2020-04-23 NOTE — Telephone Encounter (Signed)
Received the following message from Health and safety inspector w/ [facility]: "Claims are based upon date of service so if the draw has not been completed a claim can not be submitted. Our office will only file a claim for the testing performed and can not be submitted until testing is completed.Is his insurance requesting a Prior Authorization?" I replied stating we submitted PA letter to them and pt to submit to insurance. Waiting on response.

## 2020-04-24 NOTE — Telephone Encounter (Addendum)
Called [employer] at [phone] and spoke w/ Ivin Booty. Chose claims options. He researched and sees no PA needed, covered under policy for CPT codes: 73710, [phone]. When I told him pt stating it needs coverage/prior approval prior to getting labs drawn. He states it would need pre-determination. Would need to speak with utilization management. Phone [phone], pre-cert option. He transferred me to this option, I was hung up on.  I called back and spoke with Samson Frederic. She states I need to speak with pre-cert department. I asked she do a warm transfer since I have been hung up on 3 times, she stated she would but ended up just transferring me. I chose pre-cert option again. Spoke with Jaymee V. Provided CPT codes/Dx code: G36.0. Confirmed this is for genetic testing. Not cancer related/does not need pre-cert but she does recommend pre-determination. She initiated pre-determination using effective dates 04/24/20-05/23/20. She placed me on hold to finish up and create Case#. [id number]. Wanting [place] to send clinicals for review. Include case #. Fax: [phone]. Attn: utilization management department. Will review. Turn around time: 15 calender days. If needed, we can call and have date of service changed if after 05/23/20. No need to start whole process over, will just need to provide case#

## 2020-04-24 NOTE — Telephone Encounter (Signed)
Called wife and provided her an update. Advised I will call her once I hear back from insurance. She verbalized understanding.

## 2020-04-24 NOTE — Telephone Encounter (Signed)
Faxed clinical information to utilization management at [phone]. Marked urgent. Awaiting determination.

## 2020-04-25 NOTE — Telephone Encounter (Signed)
Spoke with Angie in billing. She received call from Tammy w/ pt insurance. Pre-determination denied stating this is not medically necessary/investigational. Can submit appeal to fax: [phone] if we want to go that route. Can call Tammy back directly at [phone] ext: [phone] if there are questions. Case# [id number]

## 2020-04-26 ENCOUNTER — Encounter: Payer: Self-pay | Admitting: Neurology

## 2020-04-26 ENCOUNTER — Ambulatory Visit: Payer: BC Managed Care – PPO | Admitting: Neurology

## 2020-04-26 VITALS — BP 139/93 | HR 83 | Ht 70.0 in | Wt 251.0 lb

## 2020-04-26 DIAGNOSIS — R260 Ataxic gait: Secondary | ICD-10-CM

## 2020-04-26 DIAGNOSIS — R208 Other disturbances of skin sensation: Secondary | ICD-10-CM | POA: Insufficient documentation

## 2020-04-26 DIAGNOSIS — G373 Acute transverse myelitis in demyelinating disease of central nervous system: Secondary | ICD-10-CM | POA: Diagnosis not present

## 2020-04-26 DIAGNOSIS — R200 Anesthesia of skin: Secondary | ICD-10-CM

## 2020-04-26 MED ORDER — GABAPENTIN 300 MG PO CAPS: ORAL_CAPSULE | ORAL | 11 refills | Status: DC

## 2020-04-26 NOTE — Progress Notes (Signed)
    Insured: Jas Betten, date of birth Apr 01, 1975 Patient ID: [id number] Attention: Appeals Department  To whom it may concern:  Ivan Jones is a 45 year old man who is being seen for a longitudinal extensive transverse myelitis (LETM) that occurred on December 02, 2019.  The transverse myelitis extends from C3-T1 and had subtle enhancement on MRI of the cervical spine 12/03/2019.  This pattern is very concerning for LETM associated with neuromyelitis optica spectrum disorder (NMOSD), an inflammatory condition of the central nervous system.  People with NMOSD may experience relapses with a high possibility of permanent disability.    A relapse causing additional long-term sequela on top of the permanent disability from the first event could lead to loss of ambulation.   It is essential that we run diagnostic testing that allows [place] to determine if he has NMOSD.  If he does, he will require long-term treatment with immunotherapy in order to prevent future relapses that could be disabling.   About 80% of patients with NMOSD have anti-NMO antibodies and 15% have antimargin antibodies. We need to obtain anti-NMO and anti-MOG testing.  Cell based FACS assays are known to have much higher sensitivity and specificity compared to ELISA based assays (abstract from 2020 article comparing different assays is attached and relevant result is underlying).  Therefore, it is medically necessary that he have the cell based assay performed.  These assays are not available at standard labs but are available at [facility] laboratories.  Please reconsider your decision not to authorize coverage for this medically necessary test.  Please call if you have any questions or need for further information.  Richard A. Epimenio Foot, MD, PhD, FAAN Certified in Neurology, Clinical Neurophysiology, Sleep Medicine, Pain Medicine and Neuroimaging Director, [facility]  [facility] [address] [place], [place] [address] [phone]

## 2020-04-26 NOTE — Progress Notes (Signed)
[facility]  PATIENT: Ivan Jones DOB: November 01, 1975  REFERRING DOCTOR OR PCP: Dr. Helene Kelp SOURCE: Patient, notes from hospitalization, imaging and lab reports, MRI images personally reviewed.  _________________________________   HISTORICAL  CHIEF COMPLAINT:  Chief Complaint  Patient presents with  . Follow-up    Rm 12 with wife. Reports he has been doing better, but left hand still is not at 100 % and legs are still weak. Denies any falls. Pt does report burning throughout his body at least once a week. Pt reports these sx are primarily at night. Reports he is still waiting for approval on [facility] labs     HISTORY OF PRESENT ILLNESS:  Ivan Jones is a 45 y.o. man with longitudinally extensive transverse myelitis late October 2021.    Update 04/26/2020 He is doing better and walking without a cane now.   He has reduced strength and coordination in the left hand but still does not have complete use of it.   His legs tire out easily.   He has allodynia and dysesthesias on the left side.   He has numbness from the abdomen down.    He tried playing golf and swings are much less powerful due to poor left grip.     I reviewed his MRIs.  There is a longitudinally extensive transverse myelitis from C3-T1.  Unfortunately, insurance would not authorize the cell base anti-MOG and anti-NMO assays.  We discussed that I will try to p.o. this as the test is fairly expensive.   He has noted mild memory difficulties and word finding difficulties.  He notes more problems with this than his wife close.  He snores but has not had excessive daytime sleepiness. His wife has not noted apnea signs.   The dysesthesias worsen at night and he is having difficulty sleeping.  Usually after he gets a couple hours sleep he is up and down the rest of the night.  He feels he may have mild depression.     History of transverse myelitis: On 12/02/2019, he had pain between the shoulder blades  and pain radiated to the chest.   He went to the [facility] ED, concerned about an MI.   He was evaluated and released.   The next day, 12/03/2019, he had the onset of severe pain in the shoulder blade region again.   Over the next 2 hours the hands became weaker and he was unable to open a door aor hold items.   He went to the [facility] ED.  Duringt he wait, he began to have more troublle with his legs.   He had numbness in the abdomen and tingling and mild weakness in his legs.   He felt off balanced.  At his worse, he had numbness form the left shoulder down and from the right upper chest down.     He was admitted and recevied 5 days of IV Solu-Medrol (1000 mg daily).    He felt some improvement and was walking better, feeding self but still having numbness and mild weaknes in his hands.  Legs were improved though he still needed a cane.   He continued to improve some over the next couple months but not to baseline  He has Type 2 DM and is on Janumet and insulin prn.   Sugars were very elevated during steroid therapy and he had severe insomnia.   He had Covid-19 diagnosed October 05, 2019.    Imaging studies reviewed: MRI of the cervical  spine 12/03/2019 showed several T2 hyperintense foci within the spinal cord.  Small foci are noted at C3, C3-C4 and C5-C6 and a larger focus is noted from C5-C6 through T1.  There are degenerative changes.  At C5-C6 there is spinal stenosis and foraminal narrowing but no nerve root compression.  At C6-7, there is mild spinal stenosis and bilateral foraminal narrowing.  Moderate degenerative changes at other levels that do not lead to spinal stenosis or nerve root compression.  MRI of the brain 12/04/2019 was normal.  MRI of the thoracic spine 12/04/2019 was normal.  Pertinent laboratory results: CSF 12/05/2019 showed normal protein and glucose and cell count.  VDRL was negative.  No oligoclonal bands were present.  Labs 12/03/2019 through 12/06/2019: HIV is negative.   Arboviral profile was negative.  HSV type I and II are negative.  Hemoglobin A1c 9.0.  Glucoses were elevated generally above 200 while getting steroids.  REVIEW OF SYSTEMS: Constitutional: No fevers, chills, sweats, or change in appetite Eyes: No visual changes, double vision, eye pain Ear, nose and throat: No hearing loss, ear pain, nasal congestion, sore throat Cardiovascular: No chest pain, palpitations Respiratory: No shortness of breath at rest or with exertion.   No wheezes GastrointestinaI: No nausea, vomiting, diarrhea, abdominal pain, fecal incontinence Genitourinary: No dysuria, urinary retention or frequency.  No nocturia. Musculoskeletal: No neck pain, back pain Integumentary: No rash, pruritus, skin lesions Neurological: as above Psychiatric: No depression at this time.  No anxiety Endocrine: No palpitations, diaphoresis, change in appetite, change in weigh or increased thirst Hematologic/Lymphatic: No anemia, purpura, petechiae. Allergic/Immunologic: No itchy/runny eyes, nasal congestion, recent allergic reactions, rashes  ALLERGIES: No Known Allergies  HOME MEDICATIONS:  Current Outpatient Medications:  .  aspirin EC 325 MG tablet, Take 1 tablet (325 mg total) by mouth 2 (two) times daily., Disp: 60 tablet, Rfl: 1 .  blood glucose meter kit and supplies KIT, Dispense based on patient and insurance preference. Use up to four times daily as directed. (FOR ICD-9 250.00, 250.01)., Disp: 1 each, Rfl: 0 .  escitalopram (LEXAPRO) 5 MG tablet, Take 5 mg by mouth daily., Disp: , Rfl:  .  gabapentin (NEURONTIN) 300 MG capsule, One po qAM, one po qPM and two po qHS, Disp: 90 capsule, Rfl: 11 .  Insulin Pen Needle (PEN NEEDLES 3/16") 31G X 5 MM MISC, Use tid with insulin inj, Disp: 30 each, Rfl: 0 .  irbesartan (AVAPRO) 75 MG tablet, Take 75 mg by mouth daily., Disp: , Rfl:  .  JANUMET XR 50-1000 MG TB24, Take 1 tablet by mouth 2 (two) times daily., Disp: , Rfl: 1 .  meloxicam  (MOBIC) 15 MG tablet, Take 15 mg by mouth daily., Disp: , Rfl:  .  oxyCODONE-acetaminophen (PERCOCET) 7.5-325 MG tablet, Take 1 tablet by mouth every 6 (six) hours as needed for moderate pain. , Disp: , Rfl:  .  zolpidem (AMBIEN) 5 MG tablet, Take 5 mg by mouth at bedtime as needed., Disp: , Rfl:  .  insulin aspart (NOVOLOG FLEXPEN) 100 UNIT/ML FlexPen, CBG 70 - 120:0 units CBG 121 - 150:0 unit CBG 151 - 200:1 units CBG 201 - 250: 2 units CBG 251 - 300: 4 units CBG 301 - 350: 6 units CBG 351 - 400: 7 units > 400 call you MD, Disp: 15 mL, Rfl: 0  PAST MEDICAL HISTORY: Past Medical History:  Diagnosis Date  . Arthritis    knees and shoulder  . Complication of anesthesia  wakes up to fast   . Diabetes mellitus without complication ([place])    type 2  . History of kidney stones   . Hypertension     PAST SURGICAL HISTORY: Past Surgical History:  Procedure Laterality Date  . COLON SURGERY     diverticulitis 15 inches rescected  . KNEE ARTHROSCOPY     3 times  . TOTAL KNEE ARTHROPLASTY Right 01/15/2017   Procedure: RIGHT TOTAL KNEE ARTHROPLASTY;  Surgeon: Susa Day, MD;  Location: [facility];  Service: Orthopedics;  Laterality: Right;  2 hrs    FAMILY HISTORY: Family History  Problem Relation Age of Onset  . Heart attack Mother   . Pancreatitis Mother   . Cancer Father        kidney  . Diabetes Father   . High blood pressure Father     SOCIAL HISTORY:  Social History   Socioeconomic History  . Marital status: Married    Spouse name: Not on file  . Number of children: Not on file  . Years of education: Not on file  . Highest education level: Not on file  Occupational History  . Not on file  Tobacco Use  . Smoking status: Never Smoker  . Smokeless tobacco: Never Used  Vaping Use  . Vaping Use: Never used  Substance and Sexual Activity  . Alcohol use: Not Currently  . Drug use: No  . Sexual activity: Yes  Other Topics Concern  . Not on file  Social History  Narrative   Right handed   Caffeine use: Diet MT Dew, Diet drinks   Lives with wife   Social Determinants of Health   Financial Resource Strain: Not on file  Food Insecurity: Not on file  Transportation Needs: Not on file  Physical Activity: Not on file  Stress: Not on file  Social Connections: Not on file  Intimate Partner Violence: Not on file     PHYSICAL EXAM  Vitals:   04/26/20 1603  BP: (!) 139/93  Pulse: 83  Weight: 251 lb (113.9 kg)  Height: '5\' 10"'  (1.778 m)    Body mass index is 36.01 kg/m.   General: The patient is well-developed and well-nourished and in no acute distress  HEENT:  Head is [place]/AT.  Sclera are anicteric.  Funduscopic exam shows normal optic discs and retinal vessels.  Neck: No carotid bruits are noted.  The neck is nontender.  Cardiovascular: The heart has a regular rate and rhythm with a normal S1 and S2. There were no murmurs, gallops or rubs.    Skin: Extremities are without rash or  edema.  Musculoskeletal:  Back is nontender  Neurologic Exam  Mental status: The patient is alert and oriented x 3 at the time of the examination. The patient has apparent normal recent and remote memory, with an apparently normal attention span and concentration ability.   Speech is normal.  Cranial nerves: Extraocular movements are full. Pupils are equal, round, and reactive to light and accomodation.  Visual fields are full.  Facial symmetry is present. There is good facial sensation to soft touch bilaterally.Facial strength is normal.  Trapezius and sternocleidomastoid strength is normal. No dysarthria is noted.  The tongue is midline, and the patient has symmetric elevation of the soft palate. No obvious hearing deficits are noted.  Motor:  Muscle bulk is normal.   Tone is normal. Strength is  5 / 5 in proximal arms and legs but 4/5 in left C7 and 4-/5 in leftC8/T1 innervated  muscles and 4/5 in right C8/T1 innervated hand/forearm muscles.    Sensory:  Sensory testing is intact to vibration sensation in all 4 extremities.   He has reduced temperature sensation from C6 and below left and C7   Coordination: Cerebellar testing reveals good finger-nose-finger and heel-to-shin bilaterally.  Gait and station: Station is normal.   Gait is normal. Tandem gait is wide. Romberg is negative.   Reflexes: Deep tendon reflexes are symmetric and normal bilaterally.   Plantar responses are flexor.    DIAGNOSTIC DATA (LABS, IMAGING, TESTING) - I reviewed patient records, labs, notes, testing and imaging myself where available.  Lab Results  Component Value Date   WBC 23.9 (H) 12/09/2019   HGB 14.5 12/09/2019   HCT 42.9 12/09/2019   MCV 91.9 12/09/2019   PLT 281 12/09/2019      Component Value Date/Time   NA 138 12/09/2019 0555   K 4.2 12/09/2019 0555   CL 106 12/09/2019 0555   CO2 22 12/09/2019 0555   GLUCOSE 151 (H) 12/09/2019 0555   BUN 37 (H) 12/09/2019 0555   CREATININE 1.13 12/09/2019 0555   CALCIUM 9.6 12/09/2019 0555   PROT 6.4 (L) 12/09/2019 0555   ALBUMIN 3.4 (L) 12/09/2019 0555   AST 44 (H) 12/09/2019 0555   ALT 132 (H) 12/09/2019 0555   ALKPHOS 65 12/09/2019 0555   BILITOT 0.9 12/09/2019 0555   GFRNONAA >60 12/09/2019 0555   GFRAA >60 01/16/2017 0536   No results found for: CHOL, HDL, LDLCALC, LDLDIRECT, TRIG, CHOLHDL Lab Results  Component Value Date   HGBA1C 9.0 (H) 12/05/2019   No results found for: VITAMINB12 Lab Results  Component Value Date   TSH 0.264 (L) 12/05/2019       ASSESSMENT AND PLAN  Transverse myelitis (HCC)  Dysesthesia  Numbness  Ataxic gait    1.  Mr. Vitali has longitudinally extensive transverse myelitis.  The appearance would be atypical for MS but is very concerning for neuromyelitis optica.  Additionally, postinfectious/postvaccination transverse myelitis and vasculitic transverse myelitis could also have this appearance.  Vasculitis labs (ANA/ANCA) were negative.  At his last  visit, we had ordered the FACS anti-NMO and anti-MOG test through [facility].  This is essential to determine if the etiology of the longitudinal extensive transverse myelitis is due to neuromyelitis optica.  If he does have this, treatment is necessary to prevent future relapses.  Unfortunately, insurance feels that this test is not necessary.  I will write a letter of appeal today. 2.   For the dysesthesias, I will have him start gabapentin and titrate up to 300-300-600 (1200 mg total) over the day.  Hopefully, this will also help the insomnia.  If this is not beneficial consider lamotrigine. 3.  Stay active and exercise as tolerated. 4.   Return in 4 months or sooner if there are new or worsening neurologic symptoms.  Based on the results of the animal and mild antibodies we may need to initiate immunomodulatory treatment.  Because he has diabetes steroids would not be a good choice for him.  45-minute office visit with the majority of the time spent face-to-face for history and physical, discussion/counseling and decision-making.  Additional time with record review and documentation.   Whittney Steenson A. Felecia Shelling, MD, [facility] 7/34/0370, 9:64 PM Certified in Neurology, Clinical Neurophysiology, Sleep Medicine and Neuroimaging  [facility] [address] [place], [place] [address] [phone]

## 2020-04-30 DIAGNOSIS — E1121 Type 2 diabetes mellitus with diabetic nephropathy: Secondary | ICD-10-CM | POA: Diagnosis not present

## 2020-04-30 NOTE — Telephone Encounter (Signed)
Faxed completed/signed appeal letter to [phone]. Marked urgent. Received fax confirmation, waiting on determination.

## 2020-05-02 ENCOUNTER — Other Ambulatory Visit: Payer: Self-pay | Admitting: *Deleted

## 2020-05-02 DIAGNOSIS — E1165 Type 2 diabetes mellitus with hyperglycemia: Secondary | ICD-10-CM | POA: Diagnosis not present

## 2020-05-02 DIAGNOSIS — Z6837 Body mass index (BMI) 37.0-37.9, adult: Secondary | ICD-10-CM | POA: Diagnosis not present

## 2020-05-02 DIAGNOSIS — M170 Bilateral primary osteoarthritis of knee: Secondary | ICD-10-CM | POA: Diagnosis not present

## 2020-05-02 DIAGNOSIS — I10 Essential (primary) hypertension: Secondary | ICD-10-CM | POA: Diagnosis not present

## 2020-05-02 NOTE — Telephone Encounter (Signed)
Called medical management department at [phone] ext: [phone]. LVM for Lindell Spar, RN to call office back. Provided office #.  Asked her to call back to confirm they received appeal letter and provide any updates. Advised we are open Mon-Thurs 8-5pm and closed on Fridays. I provided FTDD#[id number].

## 2020-05-08 NOTE — Telephone Encounter (Signed)
Called medical management department at [phone] ext: [phone]. LVM again for Lindell Spar, RN to call office back. Provided office #.  Advised I also LVM on 05/02/20.  Called [phone]. Chose pre-cert option. Spoke with [place]. Provided case #[id number]. It was denied on 04/25/20. Advised we were aware and we sent appeal letter on 04/30/20. She does not see appeal letter on file. She placed me on hold to do further research. She confirmed they did not receive appeal. She asked I re-fax to [phone]. She will call me by 4:30pm today if they do not receive this. Call [phone]. Faxed appeal letter again, received fax confirmation.

## 2020-05-08 NOTE — Telephone Encounter (Signed)
Jael messaged and stated Tammy calling back. I tried to pick up call, but missed transfer/was not at desk. I called her back [phone] ext: [phone]. LVM again for Lindell Spar, RN to call office back. Provided office #. Gave pt info. Provided case #[id number]. Advised I spoke with rep this am who advised she would call back by 4:30pm today to confirm she received appeal letter but I have not heard back yet. Asked her to call back to provide an update.

## 2020-05-15 NOTE — Telephone Encounter (Signed)
Called medical management department [phone] ext: [phone]. LVM again for Lindell Spar, RN to call office back. Provided office # and pt info. Provided case #[id number].Advised I also LVM on 05/08/2020.  Called [phone]. Chose pre-cert option. Spoke with Thayer Ohm. He confirmed they received appeal letter 05/08/20 and under review. Appeal case# [id number]. Due date for determination is 07/07/20. Even though we were originally told turn around time was 15 calender days. He states I will need to speak with claims department about exact turn around time/urgency. He placed me on hold to do a warm transfer. Spoke with Fayrene Fearing from claims department. He will call back in 1-2 hours to see if they can expedite request. Call ref# [id number].   Tammy had called back while I was speaking with [employer] and she states that since appeal received, I would need to speak with claims department moving forward

## 2020-05-16 NOTE — Telephone Encounter (Signed)
Called insurance back and spoke with [place]. She states Fayrene Fearing did try to call back yesterday but documented he could not get through. He sent e-mail to national appeals to request appeal be expedited. She does not see update. I again relayed the urgency of case. She placed me on hold and sent message to Fayrene Fearing to see if we can get an update. Also requested for Dr. Epimenio Foot to do P2P if possible. She was not sure if this was an option.  She got confirmed w/ Fayrene Fearing that he did send e-mail to appeals department asking for expedited review. They have 14 days to review. I again expressed urgency and that pt cannot wait that long. Provided Dr. Bonnita Hollow phone # for someone higher up to call him to discuss case from appeals department today. Ref# for call: [id number]. Ref# for Fayrene Fearing: [id number].

## 2020-05-21 NOTE — Telephone Encounter (Signed)
I never got called --- let's do the [facility] anti-NMO Ab    (might be listed as neuromyelitis optica IgG )  If positive, he has Devic's (NMO) and If negative we can then order the anti-MOG

## 2020-05-22 ENCOUNTER — Other Ambulatory Visit: Payer: Self-pay | Admitting: *Deleted

## 2020-05-22 ENCOUNTER — Other Ambulatory Visit (INDEPENDENT_AMBULATORY_CARE_PROVIDER_SITE_OTHER): Payer: Self-pay

## 2020-05-22 DIAGNOSIS — R208 Other disturbances of skin sensation: Secondary | ICD-10-CM | POA: Diagnosis not present

## 2020-05-22 DIAGNOSIS — Z0289 Encounter for other administrative examinations: Secondary | ICD-10-CM

## 2020-05-22 DIAGNOSIS — G360 Neuromyelitis optica [Devic]: Secondary | ICD-10-CM | POA: Diagnosis not present

## 2020-05-22 DIAGNOSIS — R260 Ataxic gait: Secondary | ICD-10-CM

## 2020-05-22 DIAGNOSIS — R29898 Other symptoms and signs involving the musculoskeletal system: Secondary | ICD-10-CM

## 2020-05-22 DIAGNOSIS — G373 Acute transverse myelitis in demyelinating disease of central nervous system: Secondary | ICD-10-CM

## 2020-05-22 DIAGNOSIS — R200 Anesthesia of skin: Secondary | ICD-10-CM

## 2020-05-22 DIAGNOSIS — R799 Abnormal finding of blood chemistry, unspecified: Secondary | ICD-10-CM | POA: Diagnosis not present

## 2020-05-22 DIAGNOSIS — E119 Type 2 diabetes mellitus without complications: Secondary | ICD-10-CM

## 2020-05-22 NOTE — Telephone Encounter (Signed)
[employer] back. Relayed Dr. Bonnita Hollow message. Scheduled for pt to come today 1:15pm to have lab drawn at our office. Placed lab order. Advised we will call as soon as the lab result comes back. She verbalized understanding.

## 2020-05-22 NOTE — Telephone Encounter (Signed)
Amber returned call. Please call back when available.

## 2020-05-22 NOTE — Telephone Encounter (Signed)
Called, LVM for [facility] (wife) to call office back today.

## 2020-05-25 LAB — NEUROMYELITIS OPTICA AUTOAB, IGG: NMO IgG Autoantibodies: <1.5 U/mL (ref 0.0–3.0)

## 2020-06-11 NOTE — Progress Notes (Signed)
I spoke with her about the results of the lab test.  The anti-NMO antibody was negative.  Hopefully this was a one-time transverse myelitis.  He has had fairly good recovery.  If he has any more neurologic events we will also check the anti-MOG antibody.

## 2020-07-16 DIAGNOSIS — M25511 Pain in right shoulder: Secondary | ICD-10-CM | POA: Diagnosis not present

## 2020-08-27 DIAGNOSIS — E1121 Type 2 diabetes mellitus with diabetic nephropathy: Secondary | ICD-10-CM | POA: Diagnosis not present

## 2020-08-28 ENCOUNTER — Ambulatory Visit: Payer: BC Managed Care – PPO | Admitting: Neurology

## 2020-08-28 DIAGNOSIS — E1121 Type 2 diabetes mellitus with diabetic nephropathy: Secondary | ICD-10-CM | POA: Diagnosis not present

## 2020-08-28 DIAGNOSIS — M170 Bilateral primary osteoarthritis of knee: Secondary | ICD-10-CM | POA: Diagnosis not present

## 2020-08-28 DIAGNOSIS — Z6837 Body mass index (BMI) 37.0-37.9, adult: Secondary | ICD-10-CM | POA: Diagnosis not present

## 2020-08-29 ENCOUNTER — Telehealth: Payer: Self-pay | Admitting: Neurology

## 2020-08-29 NOTE — Telephone Encounter (Signed)
I LVM for patient to reschedule appt from 7/26 when the office's power went out. 

## 2020-09-17 DIAGNOSIS — G894 Chronic pain syndrome: Secondary | ICD-10-CM | POA: Diagnosis not present

## 2020-11-02 DIAGNOSIS — Z23 Encounter for immunization: Secondary | ICD-10-CM | POA: Diagnosis not present

## 2020-11-14 DIAGNOSIS — R748 Abnormal levels of other serum enzymes: Secondary | ICD-10-CM | POA: Diagnosis not present

## 2020-11-14 DIAGNOSIS — Z6838 Body mass index (BMI) 38.0-38.9, adult: Secondary | ICD-10-CM | POA: Diagnosis not present

## 2020-11-14 DIAGNOSIS — E1165 Type 2 diabetes mellitus with hyperglycemia: Secondary | ICD-10-CM | POA: Diagnosis not present

## 2020-11-20 ENCOUNTER — Encounter: Payer: Self-pay | Admitting: Neurology

## 2020-11-20 ENCOUNTER — Ambulatory Visit: Payer: BC Managed Care – PPO | Admitting: Neurology

## 2020-11-20 ENCOUNTER — Other Ambulatory Visit: Payer: Self-pay

## 2020-11-20 VITALS — BP 129/88 | HR 96 | Ht 69.75 in | Wt 266.0 lb

## 2020-11-20 DIAGNOSIS — R208 Other disturbances of skin sensation: Secondary | ICD-10-CM | POA: Diagnosis not present

## 2020-11-20 DIAGNOSIS — G373 Acute transverse myelitis in demyelinating disease of central nervous system: Secondary | ICD-10-CM

## 2020-11-20 DIAGNOSIS — R29898 Other symptoms and signs involving the musculoskeletal system: Secondary | ICD-10-CM

## 2020-11-20 DIAGNOSIS — R200 Anesthesia of skin: Secondary | ICD-10-CM

## 2020-11-20 MED ORDER — ALPRAZOLAM 1 MG PO TABS: ORAL_TABLET | ORAL | 0 refills | Status: DC

## 2020-11-20 NOTE — Progress Notes (Signed)
[facility]  PATIENT: Ivan Jones DOB: 1975-11-29  REFERRING DOCTOR OR PCP: Dr. Leonor Liv SOURCE: Patient, notes from hospitalization, imaging and lab reports, MRI images personally reviewed.  _________________________________   HISTORICAL  CHIEF COMPLAINT:  Chief Complaint  Patient presents with   Follow-up    Rm 2, w wife. Pt reports worsening hand cramping. Left hand seem to be drawn more. Fine motor skills will aggravate sx more. Legs give out periodically, has had 1 fall, w no head injury. Energy is still low. Pt will be traveing to [facility] and wanted to know if he is able to get on rides.     HISTORY OF PRESENT ILLNESS:  Ivan Jones is a 45 y.o. man with longitudinally extensive transverse myelitis late October 2021.    Update 11/20/2020 Since the last visit, he is walking better and rarely uses the cane.   The right leg seems slightly weaker.    He notes it when ehe walks longer disnace and the right leg seems more unstable.   Arms are strong but the left hand spasms up.   He has altered sensation in his trunk and legs.     Bladder function is fine.     He did play golf and did not do as well as usual but managed to do ok.   They are planning an extended trip to [place] and they have rented a scooter for him since he would have to be on his feet a long time.  He has noted some mild memory difficulties or word finding difficulties.  These were worse late last year when he was having the initial symptoms but not completely at baseline  He had Covid early September 2021.   Transverse myelitis symptoms started about 5 weeks later.   Because of that, it is possible that he has postviral transverse myelitis.  Neuromyelitis optica is also a possibility and the timing association with COVID could be random.  Besides the dominant transverse myelitis from C5-T1, he also has a smaller focus at C3.  It is unclear if the spot at C3 is separate or part of the  more dominant lesion.  The brain did not show any lesions.  I also discussed with him and his wife that MS remains a possibility.  Oligoclonal bands were not present.  NMO IgG antibody was negative.  Unfortunately, his insurance would not cover the cell-based anti-MOG assay which would have much more value than the serum based assay.  Despite a couple pills I was unable to get this covered.       History of transverse myelitis: On 12/02/2019, he had pain between the shoulder blades and pain radiated to the chest.   He went to the [facility] ED, concerned about an MI.   He was evaluated and released.   The next day, 12/03/2019, he had the onset of severe pain in the shoulder blade region again.   Over the next 2 hours the hands became weaker and he was unable to open a door aor hold items.   He went to the [facility] one ED.  Duringt he wait, he began to have more troublle with his legs.   He had numbness in the abdomen and tingling and mild weakness in his legs.   He felt off balanced.  At his worse, he had numbness form the left shoulder down and from the right upper chest down.     He was admitted and recevied 5 days of  IV Solu-Medrol (1000 mg daily).    He felt some improvement and was walking better, feeding self but still having numbness and mild weaknes in his hands.  Legs were improved though he still needed a cane.   He continued to improve some over the next couple months but not to baseline  He has Type 2 DM and is on Janumet and insulin prn.   Sugars were very elevated during steroid therapy and he had severe insomnia.   He had Covid-19 diagnosed October 05, 2019.    Imaging studies reviewed: MRI of the cervical spine 12/03/2019 showed several T2 hyperintense foci within the spinal cord.  Small foci are noted at C3, C3-C4 and C5-C6 and a larger focus is noted from C5-C6 through T1.  There are degenerative changes.  At C5-C6 there is spinal stenosis and foraminal narrowing but no nerve root  compression.  At C6-7, there is mild spinal stenosis and bilateral foraminal narrowing.  Moderate degenerative changes at other levels that do not lead to spinal stenosis or nerve root compression.  MRI of the brain 12/04/2019 was normal.  MRI of the thoracic spine 12/04/2019 was normal.  Pertinent laboratory results: CSF 12/05/2019 showed normal protein and glucose and cell count.  VDRL was negative.  No oligoclonal bands were present.  Labs 12/03/2019 through 12/06/2019: HIV is negative.  Arboviral profile was negative.  HSV type I and II are negative.  Hemoglobin A1c 9.0.  Glucoses were elevated generally above 200 while getting steroids.  REVIEW OF SYSTEMS: Constitutional: No fevers, chills, sweats, or change in appetite Eyes: No visual changes, double vision, eye pain Ear, nose and throat: No hearing loss, ear pain, nasal congestion, sore throat Cardiovascular: No chest pain, palpitations Respiratory:  No shortness of breath at rest or with exertion.   No wheezes GastrointestinaI: No nausea, vomiting, diarrhea, abdominal pain, fecal incontinence Genitourinary:  No dysuria, urinary retention or frequency.  No nocturia. Musculoskeletal:  No neck pain, back pain Integumentary: No rash, pruritus, skin lesions Neurological: as above Psychiatric: No depression at this time.  No anxiety Endocrine: No palpitations, diaphoresis, change in appetite, change in weigh or increased thirst Hematologic/Lymphatic:  No anemia, purpura, petechiae. Allergic/Immunologic: No itchy/runny eyes, nasal congestion, recent allergic reactions, rashes  ALLERGIES: No Known Allergies  HOME MEDICATIONS:  Current Outpatient Medications:    ALPRAZolam (XANAX) 1 MG tablet, Take one or two before MRi, Disp: 2 tablet, Rfl: 0   escitalopram (LEXAPRO) 5 MG tablet, Take 5 mg by mouth daily., Disp: , Rfl:    FARXIGA 5 MG TABS tablet, Take 5 mg by mouth daily., Disp: , Rfl:    gabapentin (NEURONTIN) 300 MG capsule, One  po qAM, one po qPM and two po qHS, Disp: 90 capsule, Rfl: 11   irbesartan (AVAPRO) 75 MG tablet, Take 75 mg by mouth daily., Disp: , Rfl:    JANUMET XR 50-1000 MG TB24, Take 1 tablet by mouth 2 (two) times daily., Disp: , Rfl: 1   meloxicam (MOBIC) 15 MG tablet, Take 15 mg by mouth daily., Disp: , Rfl:    oxyCODONE-acetaminophen (PERCOCET) 10-325 MG tablet, Take 1 tablet by mouth every 6 (six) hours as needed., Disp: , Rfl:    zolpidem (AMBIEN) 5 MG tablet, Take 5 mg by mouth at bedtime as needed., Disp: , Rfl:   PAST MEDICAL HISTORY: Past Medical History:  Diagnosis Date   Arthritis    knees and shoulder   Complication of anesthesia    wakes up to  fast    Diabetes mellitus without complication (HCC)    type 2   History of kidney stones    Hypertension     PAST SURGICAL HISTORY: Past Surgical History:  Procedure Laterality Date   COLON SURGERY     diverticulitis 15 inches rescected   KNEE ARTHROSCOPY     3 times   TOTAL KNEE ARTHROPLASTY Right 01/15/2017   Procedure: RIGHT TOTAL KNEE ARTHROPLASTY;  Surgeon: Jene Every, MD;  Location: [facility];  Service: Orthopedics;  Laterality: Right;  2 hrs    FAMILY HISTORY: Family History  Problem Relation Age of Onset   Heart attack Mother    Pancreatitis Mother    Cancer Father        kidney   Diabetes Father    High blood pressure Father     SOCIAL HISTORY:  Social History   Socioeconomic History   Marital status: Married    Spouse name: Not on file   Number of children: Not on file   Years of education: Not on file   Highest education level: Not on file  Occupational History   Not on file  Tobacco Use   Smoking status: Never   Smokeless tobacco: Never  Vaping Use   Vaping Use: Never used  Substance and Sexual Activity   Alcohol use: Not Currently   Drug use: No   Sexual activity: Yes  Other Topics Concern   Not on file  Social History Narrative   Right handed   Caffeine use: Diet MT Dew, Diet drinks    Lives with wife   Social Determinants of Health   Financial Resource Strain: Not on file  Food Insecurity: Not on file  Transportation Needs: Not on file  Physical Activity: Not on file  Stress: Not on file  Social Connections: Not on file  Intimate Partner Violence: Not on file     PHYSICAL EXAM  Vitals:   11/20/20 0949  BP: 129/88  Pulse: 96  Weight: 266 lb (120.7 kg)  Height: 5' 9.75" (1.772 m)    Body mass index is 38.44 kg/m.   General: The patient is well-developed and well-nourished and in no acute distress  HEENT:  Head is [place]/AT.  Sclera are anicteric.    Neck: No carotid bruits are noted.  The neck is nontender.  Cardiovascular: The heart has a regular rate and rhythm with a normal S1 and S2. There were no murmurs, gallops or rubs.    Skin: Extremities are without rash or  edema.  Musculoskeletal:  Back is nontender  Neurologic Exam  Mental status: The patient is alert and oriented x 3 at the time of the examination. The patient has apparent normal recent and remote memory, with an apparently normal attention span and concentration ability.   Speech is normal.  Cranial nerves: Extraocular movements are full.  Facial strength and sensation was normal.  No dysarthria is noted.  The tongue is midline, and the patient has symmetric elevation of the soft palate. No obvious hearing deficits are noted.  Motor:  Muscle bulk is normal.   Tone is normal. Strength is  5 / 5 in proximal arms and now 5/5 in the triceps.  He continues to have 4/5 strength in the intrinsic hand muscles on the left.  Leg strength was 5/5.    Sensory: Sensory testing is intact to vibration sensation in all 4 extremities.   He has reduced temperature sensation from C6 and below left and C7  Coordination: Cerebellar testing reveals good finger-nose-finger and heel-to-shin bilaterally.  Gait and station: Station is normal.   Gait is near normal. Tandem gait is wide. Romberg is negative.    Reflexes: Deep tendon reflexes are symmetric and normal bilaterally.   Plantar responses are flexor.    DIAGNOSTIC DATA (LABS, IMAGING, TESTING) - I reviewed patient records, labs, notes, testing and imaging myself where available.  Lab Results  Component Value Date   WBC 23.9 (H) 12/09/2019   HGB 14.5 12/09/2019   HCT 42.9 12/09/2019   MCV 91.9 12/09/2019   PLT 281 12/09/2019      Component Value Date/Time   NA 138 12/09/2019 0555   K 4.2 12/09/2019 0555   CL 106 12/09/2019 0555   CO2 22 12/09/2019 0555   GLUCOSE 151 (H) 12/09/2019 0555   BUN 37 (H) 12/09/2019 0555   CREATININE 1.13 12/09/2019 0555   CALCIUM 9.6 12/09/2019 0555   PROT 6.4 (L) 12/09/2019 0555   ALBUMIN 3.4 (L) 12/09/2019 0555   AST 44 (H) 12/09/2019 0555   ALT 132 (H) 12/09/2019 0555   ALKPHOS 65 12/09/2019 0555   BILITOT 0.9 12/09/2019 0555   GFRNONAA >60 12/09/2019 0555   GFRAA >60 01/16/2017 0536   No results found for: CHOL, HDL, LDLCALC, LDLDIRECT, TRIG, CHOLHDL Lab Results  Component Value Date   HGBA1C 9.0 (H) 12/05/2019   No results found for: VITAMINB12 Lab Results  Component Value Date   TSH 0.264 (L) 12/05/2019       ASSESSMENT AND PLAN  Transverse myelitis (HCC) - Plan: MR BRAIN W WO CONTRAST, MR CERVICAL SPINE W WO CONTRAST  Dysesthesia - Plan: MR BRAIN W WO CONTRAST, MR CERVICAL SPINE W WO CONTRAST  Numbness  Hand weakness    1.  Mr. Shamblin has longitudinally extensive transverse myelitis.  The differential diagnosis is neuromyelitis optica, post COVID transverse myelitis, multiple sclerosis.  Vasculitis labs were negative.  Anti-NMO was negative.  His insurance would not cover an appropriate anti-MOG facs based assay despite appeals.   We need to check an MRI of the brain and cervical spine and compared to the MRI from last year to determine if he has additional lesions that would make the diagnosis of MS much more likely. 2.   stay active and exercise as tolerated. 4.    Return in 6 months or sooner if there are new or worsening neurologic symptoms.     42-minute office visit with the majority of the time spent face-to-face for history and physical, discussion/counseling and decision-making.  Additional time with record review and documentation.   Rakayla Ricklefs A. Epimenio Foot, MD, [facility] 11/20/2020, 12:52 PM Certified in Neurology, Clinical Neurophysiology, Sleep Medicine and Neuroimaging  [facility] [address], [address] [place], [place] [address] [phone]

## 2020-11-26 ENCOUNTER — Telehealth: Payer: Self-pay | Admitting: Neurology

## 2020-11-26 NOTE — Telephone Encounter (Signed)
LVM for pt to call back to schedule  [employer] auth: [id number] (exp. 11/26/20 to 12/25/20)

## 2020-12-03 ENCOUNTER — Telehealth: Payer: Self-pay | Admitting: Neurology

## 2020-12-03 DIAGNOSIS — J329 Chronic sinusitis, unspecified: Secondary | ICD-10-CM | POA: Diagnosis not present

## 2020-12-03 DIAGNOSIS — Z20828 Contact with and (suspected) exposure to other viral communicable diseases: Secondary | ICD-10-CM | POA: Diagnosis not present

## 2020-12-03 NOTE — Telephone Encounter (Signed)
Called wife back. He tested positive this am for covid, given antiviral to start today. Still needing to pick up from pharmacy. Advised he should continue to manage sx w/ OTC meds and antiviral/drink plenty of fluids. She verbalized understanding.   Wanted to make sure Dr. Epimenio Foot did not want to add on steroids d/t hx transverse myelitis. Aware I will speak with MD about this. If he wants to add this on, I will call back.

## 2020-12-03 NOTE — Telephone Encounter (Signed)
Spoke w/ Dr. Epimenio Foot. No need for steroids at this time. Can continue current treatment plan w/ PCP.

## 2020-12-03 NOTE — Telephone Encounter (Signed)
Pt's wife, Ivan Jones (on [place]) called, PCP tested him for Covid, test was positive. Was prescribed antivirus pill. Advised to call neurologist to make sure there is not any other medication he needs to be on.  Would like a call from the nurse

## 2020-12-12 DIAGNOSIS — Z1211 Encounter for screening for malignant neoplasm of colon: Secondary | ICD-10-CM | POA: Diagnosis not present

## 2020-12-12 DIAGNOSIS — Z9049 Acquired absence of other specified parts of digestive tract: Secondary | ICD-10-CM | POA: Diagnosis not present

## 2020-12-12 DIAGNOSIS — Z7901 Long term (current) use of anticoagulants: Secondary | ICD-10-CM | POA: Diagnosis not present

## 2020-12-12 DIAGNOSIS — R945 Abnormal results of liver function studies: Secondary | ICD-10-CM | POA: Diagnosis not present

## 2020-12-17 DIAGNOSIS — Z96651 Presence of right artificial knee joint: Secondary | ICD-10-CM | POA: Diagnosis not present

## 2020-12-17 DIAGNOSIS — M25511 Pain in right shoulder: Secondary | ICD-10-CM | POA: Diagnosis not present

## 2020-12-17 DIAGNOSIS — Z79891 Long term (current) use of opiate analgesic: Secondary | ICD-10-CM | POA: Diagnosis not present

## 2020-12-17 DIAGNOSIS — Z5181 Encounter for therapeutic drug level monitoring: Secondary | ICD-10-CM | POA: Diagnosis not present

## 2020-12-21 DIAGNOSIS — R945 Abnormal results of liver function studies: Secondary | ICD-10-CM | POA: Diagnosis not present

## 2021-01-14 ENCOUNTER — Telehealth: Payer: Self-pay | Admitting: Neurology

## 2021-01-14 NOTE — Telephone Encounter (Signed)
MR Brain w/wo contrast & MR Cervical spine w/wo contrast Dr. Gershon Mussel Berkley Harvey: [id number] (exp. 01/14/21 to 02/12/21). Patient is scheduled at [facility] for 01/23/21.

## 2021-01-23 ENCOUNTER — Other Ambulatory Visit: Payer: Self-pay | Admitting: *Deleted

## 2021-01-23 ENCOUNTER — Other Ambulatory Visit: Payer: Self-pay

## 2021-01-23 ENCOUNTER — Other Ambulatory Visit: Payer: Self-pay | Admitting: Neurology

## 2021-01-23 ENCOUNTER — Ambulatory Visit: Payer: BC Managed Care – PPO

## 2021-01-23 DIAGNOSIS — R208 Other disturbances of skin sensation: Secondary | ICD-10-CM

## 2021-01-23 DIAGNOSIS — G373 Acute transverse myelitis in demyelinating disease of central nervous system: Secondary | ICD-10-CM

## 2021-01-23 NOTE — Progress Notes (Signed)
Pt here today for MRI brain/cervical w/wo. However, he has hx diabetes and no recent creatinine. Istat down, in for repair per MRI truck. Unable to get today. Since Dr. Epimenio Foot out, received VO from Dr. Marjory Lies to change orders to be wo contract. If patient needs to come back later for contrast, he can do so. He will need to get creatinine checked in the meantime.

## 2021-01-29 ENCOUNTER — Telehealth: Payer: Self-pay | Admitting: Neurology

## 2021-01-29 NOTE — Telephone Encounter (Signed)
Called the patient and there was no answer. LVM advising of the findings of the MRI brain and cervical spine. Informed the results on VM and informed the patient to call back with any questions.

## 2021-01-29 NOTE — Telephone Encounter (Signed)
-----   Message from Asa Lente, MD sent at 01/25/2021  4:58 PM EST ----- Please let him know: 1.  The MRI of the brain was normal. 2.   The MRI of the cervical spine shows some abnormal signal left over from the transverse myelitis from last year but there is nothing that looks new 3.    Disc degenerative changes at C6-C7 and C5-C6 look about the same as they did last year.

## 2021-02-16 ENCOUNTER — Other Ambulatory Visit: Payer: Self-pay | Admitting: Neurology

## 2021-04-05 DIAGNOSIS — G894 Chronic pain syndrome: Secondary | ICD-10-CM | POA: Diagnosis not present

## 2021-04-05 DIAGNOSIS — M19011 Primary osteoarthritis, right shoulder: Secondary | ICD-10-CM | POA: Diagnosis not present

## 2021-05-02 DIAGNOSIS — E1129 Type 2 diabetes mellitus with other diabetic kidney complication: Secondary | ICD-10-CM | POA: Diagnosis not present

## 2021-05-16 DIAGNOSIS — Z1331 Encounter for screening for depression: Secondary | ICD-10-CM | POA: Diagnosis not present

## 2021-05-16 DIAGNOSIS — Z6838 Body mass index (BMI) 38.0-38.9, adult: Secondary | ICD-10-CM | POA: Diagnosis not present

## 2021-05-16 DIAGNOSIS — E1129 Type 2 diabetes mellitus with other diabetic kidney complication: Secondary | ICD-10-CM | POA: Diagnosis not present

## 2021-05-23 ENCOUNTER — Ambulatory Visit: Payer: BC Managed Care – PPO | Admitting: Neurology

## 2021-05-23 ENCOUNTER — Encounter: Payer: Self-pay | Admitting: Neurology

## 2021-05-23 VITALS — BP 162/94 | HR 88 | Ht 69.5 in | Wt 267.0 lb

## 2021-05-23 DIAGNOSIS — M542 Cervicalgia: Secondary | ICD-10-CM | POA: Diagnosis not present

## 2021-05-23 DIAGNOSIS — R208 Other disturbances of skin sensation: Secondary | ICD-10-CM | POA: Diagnosis not present

## 2021-05-23 DIAGNOSIS — G373 Acute transverse myelitis in demyelinating disease of central nervous system: Secondary | ICD-10-CM

## 2021-05-23 DIAGNOSIS — G379 Demyelinating disease of central nervous system, unspecified: Secondary | ICD-10-CM

## 2021-05-23 MED ORDER — BACLOFEN 10 MG PO TABS: ORAL_TABLET | ORAL | 11 refills | Status: DC

## 2021-05-23 NOTE — Progress Notes (Signed)
? ?[facility] ? ?PATIENT: Ivan Jones ?DOB: [birth year] ? ?REFERRING DOCTOR OR PCP: Dr. Leonor Liv ?SOURCE: Patient, notes from hospitalization, imaging and lab reports, MRI images personally reviewed. ? ?_________________________________ ? ? ?HISTORICAL ? ?CHIEF COMPLAINT:  ?Chief Complaint  ?Patient presents with  ? Follow-up  ?  RM 1 here for 6 month f/u- Pt reports sx have not improved. Reports trouble with hand cramping, shoulder pain, leg weakness ( a few falls over the last few months).   ? ? ?HISTORY OF PRESENT ILLNESS:  ?Ivan Jones is a 46 y.o. man with longitudinally extensive transverse myelitis late October 2021.  ? ?Update 05/23/2021: ?He is noting more cramping in the left hand.   This happens bilaterally and is worse with activity, especially repetitive tasks.     The hand strength is worse on the left, though better than last year.   He has tightness and pain in the left shoulder.    He has not been on baclofen or tizanidine.     ? ?His gait is ok.  His legs get wobbly if he walks longer distance.   He walks up to 8000/steps a day though.  He keeps his cane in his car but rarely uses it now.    ? ?Since the last visit, he is walking better and rarely uses the cane.   The right leg seems slightly weaker.    He notes it when ehe walks longer disnace and the right leg seems more unstable.   Arms are strong but the left hand spasms up.   He has altered sensation in his trunk and legs.  The burning sensation is much better with gabapentin.     Bladder function is fine.      ? ?Cognition is doing well. ? ?History of LETM: ?He had Covid early September 2021.   Transverse myelitis symptoms started about 5 weeks later.   Because of that, it is possible that he has postviral transverse myelitis.  Neuromyelitis optica is also a possibility and the timing association with COVID could be random.  Besides the dominant transverse myelitis from C5-T1, he also has a smaller focus at C3.  It  is unclear if the spot at C3 is separate or part of the more dominant lesion.  The brain did not show any lesions.  I also discussed with him and his wife that MS remains a possibility.  Oligoclonal bands were not present. ? ?NMO IgG antibody was negative.  Unfortunately, his insurance would not cover the cell-based anti-MOG assay which would have much more value than the serum based assay.   ?  ? ?   ?History of transverse myelitis: ?On 12/02/2019, he had pain between the shoulder blades and pain radiated to the chest.   He went to the [facility] ED, concerned about an MI.   He was evaluated and released.   The next day, 12/03/2019, he had the onset of severe pain in the shoulder blade region again.   Over the next 2 hours the hands became weaker and he was unable to open a door aor hold items.   He went to the [facility] one ED.  Duringt he wait, he began to have more troublle with his legs.   He had numbness in the abdomen and tingling and mild weakness in his legs.   He felt off balanced.  At his worse, he had numbness form the left shoulder down and from the right upper chest down.  He was admitted and recevied 5 days of IV Solu-Medrol (1000 mg daily).    He felt some improvement and was walking better, feeding self but still having numbness and mild weaknes in his hands.  Legs were improved though he still needed a cane.   He continued to improve some over the next couple months but not to baseline ? ?He has Type 2 DM and is on Janumet and insulin prn.   Sugars were very elevated during steroid therapy and he had severe insomnia.   He had Covid-19 diagnosed October 05, 2019.   ? ?Imaging studies reviewed: ?MRI of the cervical spine 12/03/2019 showed several T2 hyperintense foci within the spinal cord.  Small foci are noted at C3, C3-C4 and C5-C6 and a larger focus is noted from C5-C6 through T1.  There are degenerative changes.  At C5-C6 there is spinal stenosis and foraminal narrowing but no nerve root  compression.  At C6-7, there is mild spinal stenosis and bilateral foraminal narrowing.  Moderate degenerative changes at other levels that do not lead to spinal stenosis or nerve root compression. ? ?MRI of the brain 12/04/2019 was normal. ? ?MRI of the thoracic spine 12/04/2019 was normal. ? ?MRI of the brain and cervical spine 01/24/2021 shows no new lesions compared to the MRIs from 2021 ? ?Pertinent laboratory results: ?CSF 12/05/2019 showed normal protein and glucose and cell count.  VDRL was negative.  No oligoclonal bands were present. ? ?Labs 12/03/2019 through 12/06/2019: HIV is negative.  Arboviral profile was negative.  HSV type I and II are negative.  Hemoglobin A1c 9.0.  Glucoses were elevated generally above 200 while getting steroids. ? ?REVIEW OF SYSTEMS: ?Constitutional: No fevers, chills, sweats, or change in appetite ?Eyes: No visual changes, double vision, eye pain ?Ear, nose and throat: No hearing loss, ear pain, nasal congestion, sore throat ?Cardiovascular: No chest pain, palpitations ?Respiratory:  No shortness of breath at rest or with exertion.   No wheezes ?GastrointestinaI: No nausea, vomiting, diarrhea, abdominal pain, fecal incontinence ?Genitourinary:  No dysuria, urinary retention or frequency.  No nocturia. ?Musculoskeletal:  No neck pain, back pain ?Integumentary: No rash, pruritus, skin lesions ?Neurological: as above ?Psychiatric: No depression at this time.  No anxiety ?Endocrine: No palpitations, diaphoresis, change in appetite, change in weigh or increased thirst ?Hematologic/Lymphatic:  No anemia, purpura, petechiae. ?Allergic/Immunologic: No itchy/runny eyes, nasal congestion, recent allergic reactions, rashes ? ?ALLERGIES: ?No Known Allergies ? ?HOME MEDICATIONS: ? ?Current Outpatient Medications:  ?  ALPRAZolam (XANAX) 1 MG tablet, Take one or two before MRi, Disp: 2 tablet, Rfl: 0 ?  baclofen (LIORESAL) 10 MG tablet, One half to one pill po tid, Disp: 90 each, Rfl: 11 ?   FARXIGA 5 MG TABS tablet, Take 5 mg by mouth daily., Disp: , Rfl:  ?  gabapentin (NEURONTIN) 300 MG capsule, TAKE 1 CAP BY MOUTH IN THE MORNING TAKE 1 CAP BY MOUTH IN THE EVENING AND 2 CAPS BY MOUTH AS BEDTIME, Disp: 120 capsule, Rfl: 5 ?  irbesartan (AVAPRO) 75 MG tablet, Take 75 mg by mouth daily., Disp: , Rfl:  ?  JANUMET XR 50-1000 MG TB24, Take 1 tablet by mouth 2 (two) times daily., Disp: , Rfl: 1 ?  meloxicam (MOBIC) 15 MG tablet, Take 15 mg by mouth daily., Disp: , Rfl:  ?  oxyCODONE-acetaminophen (PERCOCET) 10-325 MG tablet, Take 1 tablet by mouth every 6 (six) hours as needed., Disp: , Rfl:  ? ?PAST MEDICAL HISTORY: ?Past Medical History:  ?  Diagnosis Date  ? Arthritis   ? knees and shoulder  ? Complication of anesthesia   ? wakes up to fast   ? Diabetes mellitus without complication (HCC)   ? type 2  ? History of kidney stones   ? Hypertension   ? ? ?PAST SURGICAL HISTORY: ?Past Surgical History:  ?Procedure Laterality Date  ? COLON SURGERY    ? diverticulitis 15 inches rescected  ? KNEE ARTHROSCOPY    ? 3 times  ? TOTAL KNEE ARTHROPLASTY Right 01/15/2017  ? Procedure: RIGHT TOTAL KNEE ARTHROPLASTY;  Surgeon: Jene EveryBeane, Jeffrey, MD;  Location: [facility];  Service: Orthopedics;  Laterality: Right;  2 hrs  ? ? ?FAMILY HISTORY: ?Family History  ?Problem Relation Age of Onset  ? Heart attack Mother   ? Pancreatitis Mother   ? Cancer Father   ?     kidney  ? Diabetes Father   ? High blood pressure Father   ? ? ?SOCIAL HISTORY: ? ?Social History  ? ?Socioeconomic History  ? Marital status: Married  ?  Spouse name: Not on file  ? Number of children: Not on file  ? Years of education: Not on file  ? Highest education level: Not on file  ?Occupational History  ? Not on file  ?Tobacco Use  ? Smoking status: Never  ? Smokeless tobacco: Never  ?Vaping Use  ? Vaping Use: Never used  ?Substance and Sexual Activity  ? Alcohol use: Not Currently  ? Drug use: No  ? Sexual activity: Yes  ?Other Topics Concern  ? Not on file   ?Social History Narrative  ? Right handed  ? Caffeine use: Diet MT Dew, Diet drinks  ? Lives with wife  ? ?Social Determinants of Health  ? ?Financial Resource Strain: Not on file  ?Food Insecurity: Not on file  ?Laveda Normanran

## 2021-05-24 LAB — ANTI-MOG, SERUM: MOG Antibody, Cell-based IFA: NEGATIVE

## 2021-05-27 ENCOUNTER — Telehealth: Payer: Self-pay | Admitting: *Deleted

## 2021-05-27 NOTE — Telephone Encounter (Signed)
-----   Message from Asa Lente, MD sent at 05/25/2021 12:08 PM EDT ----- ?Please let him know that the anti-MOG antibody test was negative.  This increases the possibility that the transverse myelitis was a one-time episode and not a recurrent problem ?

## 2021-05-27 NOTE — Telephone Encounter (Signed)
LVM for pt about results per Dr. Felecia Shelling note. Advised him to call if he develops any new or worsening sx moving forward. ?

## 2021-08-02 DIAGNOSIS — Z6837 Body mass index (BMI) 37.0-37.9, adult: Secondary | ICD-10-CM | POA: Diagnosis not present

## 2021-08-02 DIAGNOSIS — R21 Rash and other nonspecific skin eruption: Secondary | ICD-10-CM | POA: Diagnosis not present

## 2021-08-07 DIAGNOSIS — G894 Chronic pain syndrome: Secondary | ICD-10-CM | POA: Diagnosis not present

## 2021-08-07 DIAGNOSIS — M25511 Pain in right shoulder: Secondary | ICD-10-CM | POA: Diagnosis not present

## 2021-08-07 DIAGNOSIS — Z96651 Presence of right artificial knee joint: Secondary | ICD-10-CM | POA: Diagnosis not present

## 2021-08-11 IMAGING — RF DG SPINAL PUNCT LUMBAR DIAG WITH FL CT GUIDANCE
1 series · 1 of 1 positions shown · non-contrast
Comparison: none

CLINICAL DATA: Signal abnormalities in the cervical cord, query
transverse myelitis or demyelinating process

[Series 1: cp_standard · 0.17mm/px · 1 of 1 slices shown]
[im 1/1]
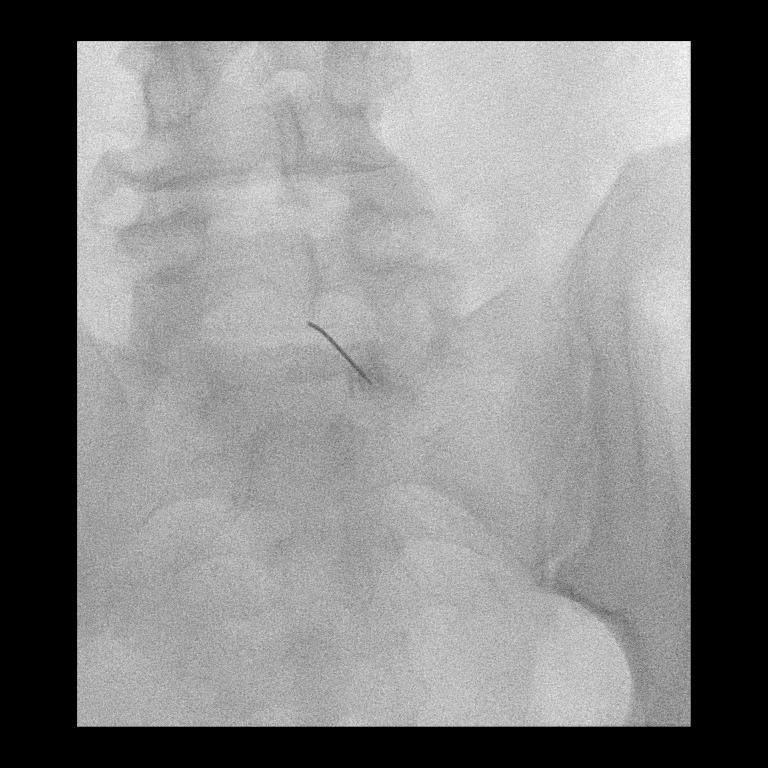

[1 of 1 positions shown; findings below may reference images not displayed]

EXAM:
DIAGNOSTIC LUMBAR PUNCTURE UNDER FLUOROSCOPIC GUIDANCE

FLUOROSCOPY TIME:  Fluoroscopy Time:  0 minutes, 24 seconds

Radiation Exposure Index (if provided by the fluoroscopic device):
5.4 mGy

Number of Acquired Spot Images: 0

PROCEDURE:
I discussed the risks (including hemorrhage, infection, and nerve
damage, among others), benefits, and alternatives to
fluoroscopically guided lumbar puncture with the patient. We
specifically discussed the high likelihood of technical success of
the procedure. The patient understood and elected to undergo the
procedure.

Standard time-out was employed. Following sterile skin prep and
local anesthetic administration consisting of 1 percent lidocaine, a
5 inch 20 gauge spinal needle was advanced without difficulty into
the thecal sac at the L5-S1 level under fluoroscopic guidance. Clear
CSF was returned. Opening pressure was 3 cm of water.

A total of 12 cc of clear CSF was collected in 4 vials. The needle
was subsequently removed and the skin cleansed and bandaged. No
immediate complications were observed.
IMPRESSION: 1. Technically successful fluoroscopically guided lumbar puncture at
the L5-S1 level, yielding 12 cc of clear CSF.

## 2021-08-22 ENCOUNTER — Telehealth: Payer: Self-pay | Admitting: Neurology

## 2021-08-22 MED ORDER — GABAPENTIN 300 MG PO CAPS: ORAL_CAPSULE | ORAL | 5 refills | Status: DC

## 2021-08-22 NOTE — Telephone Encounter (Signed)
Pt is requesting a refill for gabapentin (NEURONTIN) 300 MG capsule.  Pharmacy: [facility] [phone]

## 2021-08-22 NOTE — Telephone Encounter (Signed)
Refill sent for the patient to the pharmacy requested 

## 2021-10-14 ENCOUNTER — Telehealth: Payer: Self-pay | Admitting: Neurology

## 2021-10-14 NOTE — Telephone Encounter (Signed)
Schedule 9/25 at 2 pm for the patient for a follow up and they asked to be placed in wait list.

## 2021-10-14 NOTE — Telephone Encounter (Signed)
Called the patient's wife back and informed that April was last time that we had saw the patient. Advised it may be beneficial to have a more up to date assessment and talk next options at that point and decide best referral to be placed.

## 2021-10-14 NOTE — Telephone Encounter (Signed)
Wife states pt is in pain and having a lot of issues.  Wife asked if pt can be referred to the [facility] clinic, she'd like a call to discuss.

## 2021-10-28 ENCOUNTER — Ambulatory Visit: Payer: BC Managed Care – PPO | Admitting: Neurology

## 2021-10-28 ENCOUNTER — Encounter: Payer: Self-pay | Admitting: Neurology

## 2021-10-28 VITALS — BP 149/90 | HR 73 | Ht 69.0 in | Wt 250.0 lb

## 2021-10-28 DIAGNOSIS — R208 Other disturbances of skin sensation: Secondary | ICD-10-CM

## 2021-10-28 DIAGNOSIS — G379 Demyelinating disease of central nervous system, unspecified: Secondary | ICD-10-CM

## 2021-10-28 DIAGNOSIS — R260 Ataxic gait: Secondary | ICD-10-CM

## 2021-10-28 DIAGNOSIS — R29898 Other symptoms and signs involving the musculoskeletal system: Secondary | ICD-10-CM | POA: Diagnosis not present

## 2021-10-28 DIAGNOSIS — G373 Acute transverse myelitis in demyelinating disease of central nervous system: Secondary | ICD-10-CM

## 2021-10-28 DIAGNOSIS — R252 Cramp and spasm: Secondary | ICD-10-CM

## 2021-10-28 MED ORDER — LEVETIRACETAM 750 MG PO TABS: ORAL_TABLET | ORAL | 11 refills | Status: DC

## 2021-10-28 MED ORDER — PREGABALIN 150 MG PO CAPS: ORAL_CAPSULE | ORAL | 5 refills | Status: DC

## 2021-10-28 NOTE — Progress Notes (Signed)
[facility]  PATIENT: Ivan Jones DOB: 1975/06/22  REFERRING DOCTOR OR PCP: Dr. Helene Kelp SOURCE: Patient, notes from hospitalization, imaging and lab reports, MRI images personally reviewed.  _________________________________   HISTORICAL  CHIEF COMPLAINT:  Chief Complaint  Patient presents with   Follow-up    Pt in room #1 with wife. Pt her today for f/u.    HISTORY OF PRESENT ILLNESS:  Ivan Jones is a 47 y.o. man with longitudinally extensive transverse myelitis late October 2021.   Update 10/28/2021: He has more left posterior shoulder and left left hand cramping and pian.  Repetitive fine motor tasks and stress makes it worse.   He describes phasic spasms with posturing of the left hand, rarely the right.  Though hand strength is not completely back to baseline, it is better than last year.   He has tightness and pain in the left posterior shoulder.    He takes baclofen 20 mg po bid and gabapentin 300 mg po qAM and 900 mg po qHS.   He feels there is some benefit from the gabapentin but has not noted a benefit from the baclofen.  His gait is ok.  His legs get wobbly if he walks longer distance.   He walks up to 8000/steps a day though.  He has not needed his cane this year.  He still notes some weakness in the right leg at times.  He notes this more with longer distance. He has altered sensation in his trunk and legs.       Bladder function is fine.       Cognition is doing well.  History of transverse myelitis: He had Covid-19 early September 2021.     On 12/02/2019, he had pain between the shoulder blades and pain radiated to the chest.   He went to the [facility] ED, concerned about an MI.   He was evaluated and released.   The next day, 12/03/2019, he had the onset of severe pain in the shoulder blade region again.   Over the next 2 hours the hands became weaker and he was unable to open a door aor hold items.   He went to the [facility] one ED.  Duringt  he wait, he began to have more troublle with his legs.   He had numbness in the abdomen and tingling and mild weakness in his legs.   He felt off balanced.  At his worse, he had numbness form the left shoulder down and from the right upper chest down.     He was admitted and recevied 5 days of IV Solu-Medrol (1000 mg daily).    He felt some improvement and was walking better, feeding self but still having numbness and mild weaknes in his hands.  Legs were improved though he still needed a cane.   He continued to improve some over the next couple months but not to baseline  He has Type 2 DM and is on Janumet and insulin prn.   Sugars were very elevated during steroid therapy and he had severe insomnia.   He had Covid-19 diagnosed October 05, 2019.    Imaging studies reviewed: MRI of the cervical spine 12/03/2019 showed several T2 hyperintense foci within the spinal cord.  Small foci are noted at C3, C3-C4 and C5-C6 and a larger focus is noted from C5-C6 through T1.  There are degenerative changes.  At C5-C6 there is spinal stenosis and foraminal narrowing but no nerve root compression.  At  C6-7, there is mild spinal stenosis and bilateral foraminal narrowing.  Moderate degenerative changes at other levels that do not lead to spinal stenosis or nerve root compression.  MRI of the brain 12/04/2019 was normal.  MRI of the thoracic spine 12/04/2019 was normal.  MRI of the brain and cervical spine 01/24/2021 shows no new lesions compared to the MRIs from 2021  Pertinent laboratory results: CSF 12/05/2019 showed normal protein and glucose and cell count.  VDRL was negative.  No oligoclonal bands were present.  Labs 12/03/2019 through 12/06/2019: HIV is negative.  Arboviral profile was negative.  HSV type I and II are negative.  Hemoglobin A1c 9.0.  Glucoses were elevated generally above 200 while getting steroids.  01/04/2020: Pan ANCA, HTLV 1/2, hep C, CRP, ESR, ANA were negative or  unremarkable.  05/22/2020: Anti-AQ4 is negative.  05/23/2021: Anti-MOG (cell-based IFA) is negative.   REVIEW OF SYSTEMS: Constitutional: No fevers, chills, sweats, or change in appetite Eyes: No visual changes, double vision, eye pain Ear, nose and throat: No hearing loss, ear pain, nasal congestion, sore throat Cardiovascular: No chest pain, palpitations Respiratory:  No shortness of breath at rest or with exertion.   No wheezes GastrointestinaI: No nausea, vomiting, diarrhea, abdominal pain, fecal incontinence Genitourinary:  No dysuria, urinary retention or frequency.  No nocturia. Musculoskeletal:  No neck pain, back pain Integumentary: No rash, pruritus, skin lesions Neurological: as above Psychiatric: No depression at this time.  No anxiety Endocrine: No palpitations, diaphoresis, change in appetite, change in weigh or increased thirst Hematologic/Lymphatic:  No anemia, purpura, petechiae. Allergic/Immunologic: No itchy/runny eyes, nasal congestion, recent allergic reactions, rashes  ALLERGIES: No Known Allergies  HOME MEDICATIONS:  Current Outpatient Medications:    baclofen (LIORESAL) 10 MG tablet, One half to one pill po tid, Disp: 90 each, Rfl: 11   FARXIGA 5 MG TABS tablet, Take 5 mg by mouth daily., Disp: , Rfl:    irbesartan (AVAPRO) 75 MG tablet, Take 75 mg by mouth daily., Disp: , Rfl:    JANUMET XR 50-1000 MG TB24, Take 1 tablet by mouth 2 (two) times daily., Disp: , Rfl: 1   levETIRAcetam (KEPPRA) 750 MG tablet, One po 2-3 times a day, Disp: 90 tablet, Rfl: 11   meloxicam (MOBIC) 15 MG tablet, Take 15 mg by mouth daily., Disp: , Rfl:    oxyCODONE-acetaminophen (PERCOCET) 10-325 MG tablet, Take 1 tablet by mouth every 6 (six) hours as needed., Disp: , Rfl:    pregabalin (LYRICA) 150 MG capsule, One po qAM and 2 po qHS, Disp: 90 capsule, Rfl: 5   ALPRAZolam (XANAX) 1 MG tablet, Take one or two before MRi (Patient not taking: Reported on 10/28/2021), Disp: 2 tablet,  Rfl: 0  PAST MEDICAL HISTORY: Past Medical History:  Diagnosis Date   Arthritis    knees and shoulder   Complication of anesthesia    wakes up to fast    Diabetes mellitus without complication ([place])    type 2   History of kidney stones    Hypertension     PAST SURGICAL HISTORY: Past Surgical History:  Procedure Laterality Date   COLON SURGERY     diverticulitis 15 inches rescected   KNEE ARTHROSCOPY     3 times   TOTAL KNEE ARTHROPLASTY Right 01/15/2017   Procedure: RIGHT TOTAL KNEE ARTHROPLASTY;  Surgeon: Susa Day, MD;  Location: [facility];  Service: Orthopedics;  Laterality: Right;  2 hrs    FAMILY HISTORY: Family History  Problem Relation Age  of Onset   Heart attack Mother    Pancreatitis Mother    Cancer Father        kidney   Diabetes Father    High blood pressure Father     SOCIAL HISTORY:  Social History   Socioeconomic History   Marital status: Married    Spouse name: Not on file   Number of children: Not on file   Years of education: Not on file   Highest education level: Not on file  Occupational History   Not on file  Tobacco Use   Smoking status: Never   Smokeless tobacco: Never  Vaping Use   Vaping Use: Never used  Substance and Sexual Activity   Alcohol use: Not Currently   Drug use: No   Sexual activity: Yes  Other Topics Concern   Not on file  Social History Narrative   Right handed   Caffeine use: Diet MT Dew, Diet drinks   Lives with wife   Social Determinants of Health   Financial Resource Strain: Not on file  Food Insecurity: Not on file  Transportation Needs: Not on file  Physical Activity: Not on file  Stress: Not on file  Social Connections: Not on file  Intimate Partner Violence: Not on file     PHYSICAL EXAM  Vitals:   10/28/21 1345  BP: (!) 149/90  Pulse: 73  Weight: 250 lb (113.4 kg)  Height: '5\' 9"'  (1.753 m)    Body mass index is 36.92 kg/m.   General: The patient is well-developed and  well-nourished and in no acute distress  HEENT:  Head is [place]/AT.  Sclera are anicteric.    Skin/musculoskeletal: Extremities are without rash or  edema.  He is tender over lower neck and shoulder muscles on the left.  Normal range of motion in left shoulder though movements are mildly tender.  No tenderness over the subacromial bursa  Neurologic Exam  Mental status: The patient is alert and oriented x 3 at the time of the examination. The patient has apparent normal recent and remote memory, with an apparently normal attention span and concentration ability.   Speech is normal.  Cranial nerves: Extraocular movements are full.  Facial strength and sensation was normal.  No dysarthria is noted.  The tongue is midline, and the patient has symmetric elevation of the soft palate. No obvious hearing deficits are noted.  Motor:  Muscle bulk is normal.   Tone is normal. Strength is  5 / 5 in proximal arms and now 5/5 in the triceps.  He continues to have 4-/5 strength in the intrinsic hand muscles on the left and 4+ on right.  Fingers mildly flexed contraction4th and 5th fingers on left.  Leg strength was 5/5.    Sensory: Sensory testing is intact to vibration sensation in all 4 extremities.   He has reduced temperature sensation from C6 and below left and C7   Coordination: Cerebellar testing reveals good finger-nose-finger and heel-to-shin bilaterally.  Gait and station: Station is normal.   Gait is near normal. Tandem gait is mildly wide. Romberg is negative.   Reflexes: Deep tendon reflexes are symmetric and normal bilaterally.   Plantar responses are flexor.    DIAGNOSTIC DATA (LABS, IMAGING, TESTING) - I reviewed patient records, labs, notes, testing and imaging myself where available.  Lab Results  Component Value Date   WBC 23.9 (H) 12/09/2019   HGB 14.5 12/09/2019   HCT 42.9 12/09/2019   MCV 91.9 12/09/2019  PLT 281 12/09/2019      Component Value Date/Time   NA 138 12/09/2019  0555   K 4.2 12/09/2019 0555   CL 106 12/09/2019 0555   CO2 22 12/09/2019 0555   GLUCOSE 151 (H) 12/09/2019 0555   BUN 37 (H) 12/09/2019 0555   CREATININE 1.13 12/09/2019 0555   CALCIUM 9.6 12/09/2019 0555   PROT 6.4 (L) 12/09/2019 0555   ALBUMIN 3.4 (L) 12/09/2019 0555   AST 44 (H) 12/09/2019 0555   ALT 132 (H) 12/09/2019 0555   ALKPHOS 65 12/09/2019 0555   BILITOT 0.9 12/09/2019 0555   GFRNONAA >60 12/09/2019 0555   GFRAA >60 01/16/2017 0536   No results found for: "CHOL", "HDL", "LDLCALC", "LDLDIRECT", "TRIG", "CHOLHDL" Lab Results  Component Value Date   HGBA1C 9.0 (H) 12/05/2019   No results found for: "VITAMINB12" Lab Results  Component Value Date   TSH 0.264 (L) 12/05/2019       ASSESSMENT AND PLAN  Transverse myelitis ([place])  Demyelinating disease ([place])  Dysesthesia  Hand weakness  Ataxic gait  Spasms of the hands or feet    1.  Mr. Rouillard has longitudinally extensive transverse myelitis.   Anti-NMO and anti-MOG were negative.  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  Neuromyelitis optica is possible.  We will check anti-MOG antibody and consider treatment if positive.  MS is less likely with no changes on most recent MRI but I will want to check the MRI once more next year to rule out subclinical activity. 2.   stay active and exercise as tolerated. 3.   Continues to experience dysesthesias that are helped by gabapentin.  However, he has difficulty taking 3 times a day.  I will switch to pregabalin and increase the dose as tolerated for effectiveness.  If the pregabalin at a higher dose does not help we could also consider lamotrigine 4.   The phasic spasms in the hand did not benefit from baclofen.  I will have him start levetiracetam and titrate up to 750 mg 3 times a day.  If this is not beneficial, we could consider a benzodiazepine 5.   He asked about a second opinion at the [facility] clinic or other  center.  I would be happy to refer if they would like.  They will give this more thought.   6.  Return in 6 months or sooner if there are new or worsening neurologic symptoms.     42-minute office visit with the majority of the time spent face-to-face for history and physical, discussion/counseling and decision-making.  Additional time with record review and documentation.    Nelson Noone A. Felecia Shelling, MD, [facility] 4/69/5072, 2:57 PM Certified in Neurology, Clinical Neurophysiology, Sleep Medicine and Neuroimaging  [facility] [address], [place], [place] [address] [phone]

## 2021-12-02 DIAGNOSIS — E1129 Type 2 diabetes mellitus with other diabetic kidney complication: Secondary | ICD-10-CM | POA: Diagnosis not present

## 2021-12-09 ENCOUNTER — Telehealth: Payer: Self-pay

## 2021-12-09 DIAGNOSIS — G894 Chronic pain syndrome: Secondary | ICD-10-CM | POA: Diagnosis not present

## 2021-12-09 NOTE — Patient Outreach (Signed)
  Care Coordination   Initial Visit Note   12/09/2021 Name: Ivan Jones MRN: [id number] DOB: 31-Aug-1975  Ivan Jones is a 46 y.o. year old male who sees Ivan Hipps, MD for primary care. I spoke with  Ivan Jones by phone today.  What matters to the patients health and wellness today?  Placed call to patient to review and offer [facility] care coordination program. Patient reports that he is doing well and has a follow up with MD tomorrow. Denies any needs at this time.    SDOH assessments and interventions completed:  No     Care Coordination Interventions Activated:  No  Care Coordination Interventions:  No, not indicated   Follow up plan: No further intervention required.   Encounter Outcome:  Pt. Refused   Tomasa Rand, RN, BSN, CEN [facility] [employer] [phone]

## 2021-12-10 DIAGNOSIS — Z6837 Body mass index (BMI) 37.0-37.9, adult: Secondary | ICD-10-CM | POA: Diagnosis not present

## 2021-12-10 DIAGNOSIS — I10 Essential (primary) hypertension: Secondary | ICD-10-CM | POA: Diagnosis not present

## 2021-12-10 DIAGNOSIS — E1165 Type 2 diabetes mellitus with hyperglycemia: Secondary | ICD-10-CM | POA: Diagnosis not present

## 2021-12-10 DIAGNOSIS — N200 Calculus of kidney: Secondary | ICD-10-CM | POA: Diagnosis not present

## 2021-12-10 DIAGNOSIS — Z23 Encounter for immunization: Secondary | ICD-10-CM | POA: Diagnosis not present

## 2021-12-24 ENCOUNTER — Ambulatory Visit: Payer: BC Managed Care – PPO | Admitting: Neurology

## 2022-01-31 DIAGNOSIS — Z79899 Other long term (current) drug therapy: Secondary | ICD-10-CM | POA: Diagnosis not present

## 2022-01-31 DIAGNOSIS — Z5181 Encounter for therapeutic drug level monitoring: Secondary | ICD-10-CM | POA: Diagnosis not present

## 2022-03-13 DIAGNOSIS — Z6838 Body mass index (BMI) 38.0-38.9, adult: Secondary | ICD-10-CM | POA: Diagnosis not present

## 2022-03-13 DIAGNOSIS — L03115 Cellulitis of right lower limb: Secondary | ICD-10-CM | POA: Diagnosis not present

## 2022-03-17 DIAGNOSIS — L989 Disorder of the skin and subcutaneous tissue, unspecified: Secondary | ICD-10-CM | POA: Diagnosis not present

## 2022-03-24 DIAGNOSIS — L271 Localized skin eruption due to drugs and medicaments taken internally: Secondary | ICD-10-CM | POA: Diagnosis not present

## 2022-04-10 DIAGNOSIS — Z96651 Presence of right artificial knee joint: Secondary | ICD-10-CM | POA: Diagnosis not present

## 2022-04-10 DIAGNOSIS — G894 Chronic pain syndrome: Secondary | ICD-10-CM | POA: Diagnosis not present

## 2022-05-08 ENCOUNTER — Ambulatory Visit: Payer: BC Managed Care – PPO | Admitting: Neurology

## 2022-05-08 ENCOUNTER — Encounter: Payer: Self-pay | Admitting: Neurology

## 2022-05-08 VITALS — BP 135/89 | HR 69 | Ht 69.0 in | Wt 267.5 lb

## 2022-05-08 DIAGNOSIS — R260 Ataxic gait: Secondary | ICD-10-CM

## 2022-05-08 DIAGNOSIS — G373 Acute transverse myelitis in demyelinating disease of central nervous system: Secondary | ICD-10-CM

## 2022-05-08 DIAGNOSIS — G379 Demyelinating disease of central nervous system, unspecified: Secondary | ICD-10-CM | POA: Diagnosis not present

## 2022-05-08 DIAGNOSIS — R200 Anesthesia of skin: Secondary | ICD-10-CM

## 2022-05-08 DIAGNOSIS — R252 Cramp and spasm: Secondary | ICD-10-CM

## 2022-05-08 MED ORDER — ALPRAZOLAM 1 MG PO TABS: ORAL_TABLET | ORAL | 0 refills | Status: DC

## 2022-05-08 MED ORDER — PREGABALIN 300 MG PO CAPS
ORAL_CAPSULE | ORAL | 5 refills | Status: DC
Start: 2022-05-08 — End: 2023-06-04

## 2022-05-08 MED ORDER — LEVETIRACETAM 750 MG PO TABS: ORAL_TABLET | ORAL | 11 refills | Status: DC

## 2022-05-08 NOTE — Progress Notes (Addendum)
[facility]  PATIENT: Ivan Jones DOB: 1975/11/22  REFERRING DOCTOR OR PCP: Dr. Helene Kelp SOURCE: Patient, notes from hospitalization, imaging and lab reports, MRI images personally reviewed.  _________________________________   HISTORICAL  CHIEF COMPLAINT:  Chief Complaint  Patient presents with   Follow-up    Pt in room #1 with wife. Pt her today for f/u.    HISTORY OF PRESENT ILLNESS:  Ivan Jones is a 47 y.o. man with longitudinally extensive transverse myelitis late October 2021.   Update 10/28/2021: He is noting continued phasic spasms in the left arm.  These occur several times a day and last 30-60 seconds.   Typing or wriing may trigger a spell,    We have tried several different medications.    Gabapentin helped some at the highest dose of 1200 mg tid and we switched to Pregabalin but it is no better, ecen at 450 mg / day.   Levetitacetam has helped some and now at 2250/day.  Has not tried a benzo.  Baclofen had no thelped much  He had Covid-19 again and developed a silver dollar sized lesion on his right thigh.    He saw PCP and started clindamycin and then saw the [facility] ED and no diagnosis could be made.   He then saw dermatology at [facility] and it was felt to be related to the infection.    He has more left posterior shoulder and left left hand cramping and pian.  Repetitive fine motor tasks and stress makes it worse.   He describes phasic spasms with posturing of the left hand, rarely the right.  Though hand strength is not completely back to baseline, it is better than last year.   He has tightness and pain in the left posterior shoulder.    He takes baclofen 20 mg po bid and gabapentin 300 mg po qAM and 900 mg po qHS.   He feels there is some benefit from the gabapentin but has not noted a benefit from the baclofen.  His gait is ok.  His legs get wobbly if he walks longer distance.   He walks up to 8000/steps a day though.  He has not needed  his cane this year.  He still notes some weakness in the right leg at times.  He notes this more with longer distance. He has altered sensation in his trunk and legs.       Bladder function is fine.       Cognition is doing well.  History of transverse myelitis: He had Covid-19 early September 2021.     On 12/02/2019, he had pain between the shoulder blades and pain radiated to the chest.   He went to the [facility] ED, concerned about an MI.   He was evaluated and released.   The next day, 12/03/2019, he had the onset of severe pain in the shoulder blade region again.   Over the next 2 hours the hands became weaker and he was unable to open a door aor hold items.   He went to the [facility] ED.  Duringt he wait, he began to have more troublle with his legs.   He had numbness in the abdomen and tingling and mild weakness in his legs.   He felt off balanced.  At his worse, he had numbness form the left shoulder down and from the right upper chest down.     He was admitted and recevied 5 days of IV Solu-Medrol (1000 mg  daily).    He felt some improvement and was walking better, feeding self but still having numbness and mild weaknes in his hands.  Legs were improved though he still needed a cane.   He continued to improve some over the next couple months but not to baseline  He has Type 2 DM and is on Janumet and insulin prn.   Sugars were very elevated during steroid therapy and he had severe insomnia.   He had Covid-19 diagnosed October 05, 2019.    Imaging studies reviewed: MRI of the cervical spine 12/03/2019 showed several T2 hyperintense foci within the spinal cord.  Small foci are noted at C3, C3-C4 and C5-C6 and a larger focus is noted from C5-C6 through T1.  There are degenerative changes.  At C5-C6 there is spinal stenosis and foraminal narrowing but no nerve root compression.  At C6-7, there is mild spinal stenosis and bilateral foraminal narrowing.  Moderate degenerative changes at other levels  that do not lead to spinal stenosis or nerve root compression.  MRI of the brain 12/04/2019 was normal.  MRI of the thoracic spine 12/04/2019 was normal.  MRI of the brain and cervical spine 01/24/2021 shows no new lesions compared to the MRIs from 2021  Pertinent laboratory results: CSF 12/05/2019 showed normal protein and glucose and cell count.  VDRL was negative.  No oligoclonal bands were present.  Labs 12/03/2019 through 12/06/2019: HIV is negative.  Arboviral profile was negative.  HSV type I and II are negative.  Hemoglobin A1c 9.0.  Glucoses were elevated generally above 200 while getting steroids.  01/04/2020: Pan ANCA, HTLV 1/2, hep C, CRP, ESR, ANA were negative or unremarkable.  05/22/2020: Anti-AQ4 is negative.  05/23/2021: Anti-MOG (cell-based IFA) is negative.   REVIEW OF SYSTEMS: Constitutional: No fevers, chills, sweats, or change in appetite Eyes: No visual changes, double vision, eye pain Ear, nose and throat: No hearing loss, ear pain, nasal congestion, sore throat Cardiovascular: No chest pain, palpitations Respiratory:  No shortness of breath at rest or with exertion.   No wheezes GastrointestinaI: No nausea, vomiting, diarrhea, abdominal pain, fecal incontinence Genitourinary:  No dysuria, urinary retention or frequency.  No nocturia. Musculoskeletal:  No neck pain, back pain Integumentary: No rash, pruritus, skin lesions Neurological: as above Psychiatric: No depression at this time.  No anxiety Endocrine: No palpitations, diaphoresis, change in appetite, change in weigh or increased thirst Hematologic/Lymphatic:  No anemia, purpura, petechiae. Allergic/Immunologic: No itchy/runny eyes, nasal congestion, recent allergic reactions, rashes  ALLERGIES: No Known Allergies  HOME MEDICATIONS:  Current Outpatient Medications:    baclofen (LIORESAL) 10 MG tablet, One half to one pill po tid, Disp: 90 each, Rfl: 11   FARXIGA 5 MG TABS tablet, Take 5 mg by mouth  daily., Disp: , Rfl:    irbesartan (AVAPRO) 75 MG tablet, Take 75 mg by mouth daily., Disp: , Rfl:    JANUMET XR 50-1000 MG TB24, Take 1 tablet by mouth 2 (two) times daily., Disp: , Rfl: 1   levETIRAcetam (KEPPRA) 750 MG tablet, One po 2-3 times a day, Disp: 90 tablet, Rfl: 11   meloxicam (MOBIC) 15 MG tablet, Take 15 mg by mouth daily., Disp: , Rfl:    oxyCODONE-acetaminophen (PERCOCET) 10-325 MG tablet, Take 1 tablet by mouth every 6 (six) hours as needed., Disp: , Rfl:    pregabalin (LYRICA) 150 MG capsule, One po qAM and 2 po qHS, Disp: 90 capsule, Rfl: 5   ALPRAZolam (XANAX) 1 MG tablet, Take  one or two before MRi (Patient not taking: Reported on 10/28/2021), Disp: 2 tablet, Rfl: 0  PAST MEDICAL HISTORY: Past Medical History:  Diagnosis Date   Arthritis    knees and shoulder   Complication of anesthesia    wakes up to fast    Diabetes mellitus without complication ([place])    type 2   History of kidney stones    Hypertension     PAST SURGICAL HISTORY: Past Surgical History:  Procedure Laterality Date   COLON SURGERY     diverticulitis 15 inches rescected   KNEE ARTHROSCOPY     3 times   TOTAL KNEE ARTHROPLASTY Right 01/15/2017   Procedure: RIGHT TOTAL KNEE ARTHROPLASTY;  Surgeon: Susa Day, MD;  Location: [facility];  Service: Orthopedics;  Laterality: Right;  2 hrs    FAMILY HISTORY: Family History  Problem Relation Age of Onset   Heart attack Mother    Pancreatitis Mother    Cancer Father        kidney   Diabetes Father    High blood pressure Father     SOCIAL HISTORY:  Social History   Socioeconomic History   Marital status: Married    Spouse name: Not on file   Number of children: Not on file   Years of education: Not on file   Highest education level: Not on file  Occupational History   Not on file  Tobacco Use   Smoking status: Never   Smokeless tobacco: Never  Vaping Use   Vaping Use: Never used  Substance and Sexual Activity   Alcohol use: Not  Currently   Drug use: No   Sexual activity: Yes  Other Topics Concern   Not on file  Social History Narrative   Right handed   Caffeine use: Diet MT Dew, Diet drinks   Lives with wife   Social Determinants of Health   Financial Resource Strain: Not on file  Food Insecurity: Not on file  Transportation Needs: Not on file  Physical Activity: Not on file  Stress: Not on file  Social Connections: Not on file  Intimate Partner Violence: Not on file     PHYSICAL EXAM  Vitals:   10/28/21 1345  BP: (!) 149/90  Pulse: 73  Weight: 250 lb (113.4 kg)  Height: 5\' 9"  (1.753 m)    Body mass index is 36.92 kg/m.   General: The patient is well-developed and well-nourished and in no acute distress  HEENT:  Head is [place]/AT.  Sclera are anicteric.    Skin/musculoskeletal: Extremities are without rash or  edema.  He is tender over lower neck and shoulder muscles on the left.  Normal range of motion in left shoulder though movements are mildly tender.  No tenderness over the subacromial bursa  Neurologic Exam  Mental status: The patient is alert and oriented x 3 at the time of the examination. The patient has apparent normal recent and remote memory, with an apparently normal attention span and concentration ability.   Speech is normal.  Cranial nerves: Extraocular movements are full.  Facial strength and sensation was normal.  No dysarthria is noted.  The tongue is midline, and the patient has symmetric elevation of the soft palate. No obvious hearing deficits are noted.  Motor:  Muscle bulk is normal.   Tone is normal though he has mild flexion contracture of the fourth and fifth fingers on the left, similar to last visit.  Strength is  5 / 5 in proximal arms  and now 5/5 in the triceps.  He continues to have 4-/5 strength in the intrinsic hand muscles on the left and 4+ on right.   Leg strength was 5/5.    Sensory: Sensory testing is intact to vibration sensation in all 4 extremities.    He has reduced temperature sensation from C6 and below left and C7 and below on the right  Coordination: Cerebellar testing reveals good finger-nose-finger and heel-to-shin bilaterally.  Gait and station: Station is normal.   Gait is near normal. Tandem gait is mildly wide. Romberg is negative.   Reflexes: Deep tendon reflexes are symmetric and normal bilaterally.        DIAGNOSTIC DATA (LABS, IMAGING, TESTING) - I reviewed patient records, labs, notes, testing and imaging myself where available.  Lab Results  Component Value Date   WBC 23.9 (H) 12/09/2019   HGB 14.5 12/09/2019   HCT 42.9 12/09/2019   MCV 91.9 12/09/2019   PLT 281 12/09/2019      Component Value Date/Time   NA 138 12/09/2019 0555   K 4.2 12/09/2019 0555   CL 106 12/09/2019 0555   CO2 22 12/09/2019 0555   GLUCOSE 151 (H) 12/09/2019 0555   BUN 37 (H) 12/09/2019 0555   CREATININE 1.13 12/09/2019 0555   CALCIUM 9.6 12/09/2019 0555   PROT 6.4 (L) 12/09/2019 0555   ALBUMIN 3.4 (L) 12/09/2019 0555   AST 44 (H) 12/09/2019 0555   ALT 132 (H) 12/09/2019 0555   ALKPHOS 65 12/09/2019 0555   BILITOT 0.9 12/09/2019 0555   GFRNONAA >60 12/09/2019 0555   GFRAA >60 01/16/2017 0536   No results found for: "CHOL", "HDL", "LDLCALC", "LDLDIRECT", "TRIG", "CHOLHDL" Lab Results  Component Value Date   HGBA1C 9.0 (H) 12/05/2019   No results found for: "VITAMINB12" Lab Results  Component Value Date   TSH 0.264 (L) 12/05/2019       ASSESSMENT AND PLAN  Transverse myelitis (HCC)  Demyelinating disease (HCC)  Dysesthesia  Hand weakness  Ataxic gait  Spasms of the hands or feet    1.  Mr. Perra has a transverse myelitis following COVID 19 infection.  The appearance is unusual with patchy confluency noted on sagittal images with more discrete focus at C4, C6 and C7.Marland Kitchen   Anti-NMO and anti-MOG were negative.  Most likely he had a monophasic illness.  We need to check another MRI of the brain and cervical spine to  determine if there has been any progression.  If this has occurred the possibility of MS is higher and we would need to consider a disease modifying therapy.   2.   stay active and exercise as tolerated. 3.   Dysesthesias are a little better on pregabalin and I will push the dose little bit further.  Lamotrigine could be another medication to try if not sufficiently improved.   4.   To help the phasic spasms more I will increase the levetiracetam.  Additionally, if not better in a few weeks we will add clonazepam.  We also discussed that if none of the pills help much we could consider Botox. 5.    Return in 6 months or sooner if there are new or worsening neurologic symptoms.     office visit with the majority of the time spent face-to-face for history and physical, discussion/counseling and decision-making.  Additional time with record review and documentation.  Addendum: I spoke with Mr. Balinski about the MRI results.  Compared to his previous cervical spine MRI, there  is a small new focus adjacent to C3, towards the left centrally.  It enhances after contrast.  Of note, he did have additional symptoms in the left arm that started about 2 weeks before the MRI.   He has several other foci in the spinal cord consistent with demyelination though he does not need to be full into any category.  Recheck lab work.  Anti-MOG was negative (anti-NMO was negative in past).  Lab work for chronic infections (hepatitis B, TB, HIV) were negative.  The combination of the old lesions plus the new enhancing lesion in the spinal cord in a location consistent with his symptoms confirm the diagnosis of MS.  Due to aggressiveness with multiple spinal cord lesions, we need to start an anti-CD20 agent.  We will have him start Briumvi (or Ocrevus as a backup) -- RAS       Ronie Fleeger A. Epimenio Foot, MD, [facility] 10/28/2021, 4:54 PM Certified in Neurology, Clinical Neurophysiology, Sleep Medicine and  Neuroimaging  [facility] [address] [place], [place] [address] [phone]

## 2022-05-13 ENCOUNTER — Telehealth: Payer: Self-pay | Admitting: Neurology

## 2022-05-13 NOTE — Telephone Encounter (Signed)
Yetta Numbers: [id number] exp. 05/13/22-06/11/22 sent to GI [phone]

## 2022-05-19 DIAGNOSIS — Z0000 Encounter for general adult medical examination without abnormal findings: Secondary | ICD-10-CM | POA: Diagnosis not present

## 2022-05-19 DIAGNOSIS — Z6839 Body mass index (BMI) 39.0-39.9, adult: Secondary | ICD-10-CM | POA: Diagnosis not present

## 2022-05-19 DIAGNOSIS — Z125 Encounter for screening for malignant neoplasm of prostate: Secondary | ICD-10-CM | POA: Diagnosis not present

## 2022-05-19 DIAGNOSIS — E1129 Type 2 diabetes mellitus with other diabetic kidney complication: Secondary | ICD-10-CM | POA: Diagnosis not present

## 2022-05-19 DIAGNOSIS — E1165 Type 2 diabetes mellitus with hyperglycemia: Secondary | ICD-10-CM | POA: Diagnosis not present

## 2022-06-07 ENCOUNTER — Ambulatory Visit
Admission: RE | Admit: 2022-06-07 | Discharge: 2022-06-07 | Disposition: A | Discharge status: Home / Self Care | Payer: BC Managed Care – PPO | Source: Ambulatory Visit | Attending: Neurology | Admitting: Neurology

## 2022-06-07 DIAGNOSIS — G373 Acute transverse myelitis in demyelinating disease of central nervous system: Secondary | ICD-10-CM

## 2022-06-07 DIAGNOSIS — G379 Demyelinating disease of central nervous system, unspecified: Secondary | ICD-10-CM

## 2022-06-07 MED ORDER — GADOPICLENOL 0.5 MMOL/ML IV SOLN
10.0000 mL | Freq: Once | INTRAVENOUS | Status: AC | PRN
  Administered 2022-06-07: 10 mL via INTRAVENOUS

## 2022-06-09 ENCOUNTER — Telehealth: Payer: Self-pay | Admitting: Neurology

## 2022-06-10 ENCOUNTER — Other Ambulatory Visit: Payer: Self-pay | Admitting: Neurology

## 2022-06-10 DIAGNOSIS — G379 Demyelinating disease of central nervous system, unspecified: Secondary | ICD-10-CM

## 2022-06-10 DIAGNOSIS — Z79899 Other long term (current) drug therapy: Secondary | ICD-10-CM

## 2022-06-10 NOTE — Telephone Encounter (Signed)
I spoke with Ivan Jones about the MRI results.  Compared to his previous cervical spine MRI, there is a small new focus adjacent to C3, towards the left centrally.  It enhances after contrast.  Of note, he did have additional symptoms in the left arm that started about 2 weeks before the MRI.  He has several other foci in the spinal cord.  Consistent with demyelination though he does not need to be full into any category.  Episodes at different times could be consistent with multiple sclerosis.  Oligoclonal bands, however, were negative.  Anti-MOG and anti-NMO had also been negative.  I discussed with him that I still feel that he has a demyelinating disorder of some sort and I am considering starting an anti-CD20 agent.  We will check blood work to see if this is a possibility..  I will also recheck an anti-MOG.  Of note, initial symptoms followed a COVID-19 infection and this more recent episode also followed a COVID-19 infection.

## 2022-06-12 ENCOUNTER — Other Ambulatory Visit (INDEPENDENT_AMBULATORY_CARE_PROVIDER_SITE_OTHER): Payer: Self-pay

## 2022-06-12 DIAGNOSIS — G379 Demyelinating disease of central nervous system, unspecified: Secondary | ICD-10-CM

## 2022-06-12 DIAGNOSIS — Z0289 Encounter for other administrative examinations: Secondary | ICD-10-CM

## 2022-06-12 DIAGNOSIS — Z79899 Other long term (current) drug therapy: Secondary | ICD-10-CM | POA: Diagnosis not present

## 2022-06-13 LAB — QUANTIFERON-TB GOLD PLUS

## 2022-06-13 LAB — HEPATITIS B CORE ANTIBODY, TOTAL: Hep B Core Total Ab: NEGATIVE

## 2022-06-13 LAB — ANTI-MOG, SERUM: MOG Antibody, Cell-based IFA: NEGATIVE

## 2022-06-13 LAB — CBC WITH DIFFERENTIAL/PLATELET
EOS (ABSOLUTE): 0.1 10*3/uL (ref 0.0–0.4)
Hemoglobin: 14.7 g/dL (ref 13.0–17.7)
Immature Granulocytes: 0 %
Lymphocytes Absolute: 3.6 10*3/uL — ABNORMAL HIGH (ref 0.7–3.1)
MCV: 91 fL (ref 79–97)

## 2022-06-13 LAB — HEPATIC FUNCTION PANEL
AST: 39 IU/L (ref 0–40)
Bilirubin Total: 0.9 mg/dL (ref 0.0–1.2)

## 2022-06-14 LAB — IGG, IGA, IGM: IgG (Immunoglobin G), Serum: 1009 mg/dL (ref 603–1613)

## 2022-06-14 LAB — HEPATIC FUNCTION PANEL
ALT: 48 IU/L — ABNORMAL HIGH (ref 0–44)
Albumin: 4.5 g/dL (ref 4.1–5.1)
Total Protein: 7.3 g/dL (ref 6.0–8.5)

## 2022-06-14 LAB — QUANTIFERON-TB GOLD PLUS

## 2022-06-14 LAB — CBC WITH DIFFERENTIAL/PLATELET: Monocytes: 8 %

## 2022-06-18 ENCOUNTER — Telehealth: Payer: Self-pay | Admitting: Neurology

## 2022-06-18 LAB — HEPATIC FUNCTION PANEL
Alkaline Phosphatase: 88 IU/L (ref 44–121)
Bilirubin, Direct: 0.2 mg/dL (ref 0.00–0.40)

## 2022-06-18 LAB — CBC WITH DIFFERENTIAL/PLATELET
Basophils Absolute: 0.1 10*3/uL (ref 0.0–0.2)
Basos: 1 %
Eos: 1 %
Hematocrit: 44 % (ref 37.5–51.0)
Immature Grans (Abs): 0 10*3/uL (ref 0.0–0.1)
Lymphs: 36 %
MCH: 30.5 pg (ref 26.6–33.0)
MCHC: 33.4 g/dL (ref 31.5–35.7)
Monocytes Absolute: 0.8 10*3/uL (ref 0.1–0.9)
Neutrophils Absolute: 5.5 10*3/uL (ref 1.4–7.0)
Neutrophils: 54 %
Platelets: 264 10*3/uL (ref 150–450)
RBC: 4.82 x10E6/uL (ref 4.14–5.80)
RDW: 12.7 % (ref 11.6–15.4)
WBC: 10 10*3/uL (ref 3.4–10.8)

## 2022-06-18 LAB — VARICELLA ZOSTER ANTIBODY, IGG: Varicella zoster IgG: 167 index (ref >165)

## 2022-06-18 LAB — QUANTIFERON-TB GOLD PLUS
QuantiFERON Mitogen Value: >10 IU/mL
QuantiFERON Nil Value: 0.05 IU/mL
QuantiFERON TB1 Ag Value: 0.05 IU/mL
QuantiFERON TB2 Ag Value: 0.04 IU/mL

## 2022-06-18 LAB — HIV ANTIBODY (ROUTINE TESTING W REFLEX): HIV Screen 4th Generation wRfx: NONREACTIVE

## 2022-06-18 LAB — IGG, IGA, IGM
IgA/Immunoglobulin A, Serum: 295 mg/dL (ref 90–386)
IgM (Immunoglobulin M), Srm: 113 mg/dL (ref 20–172)

## 2022-06-18 LAB — HEPATITIS B SURFACE ANTIGEN: Hepatitis B Surface Ag: NEGATIVE

## 2022-06-18 NOTE — Telephone Encounter (Signed)
Pt wife called. Stated she would like a follow-up on Lab results.

## 2022-06-18 NOTE — Telephone Encounter (Signed)
Dr. Epimenio Foot- wife calling about lab results from 06/12/22. I do not see a result note in epic yet

## 2022-06-24 NOTE — Telephone Encounter (Signed)
Per Dr. Epimenio Foot: "The laboratory data looks okay.  The 1 liver test was slightly increased but much better than labs were a couple years ago.   Therefore we can start a medication.  Please see if we can get Briumvi or Ocrevus approved.  I am going to addend his last note updated for the MRIs "   Pt called office back. I took call and went over results. He is ok with either option, whichever insurance covers. Aware we will send in start form and once infusion suite has approval, they will call to schedule him. He verbalized understanding.   I emailed him both Briumvi and Ocrevus start form to sign and send back. Waiting on returned signed copies. Email: [email or web address].

## 2022-06-25 ENCOUNTER — Telehealth: Payer: Self-pay | Admitting: *Deleted

## 2022-06-25 NOTE — Telephone Encounter (Signed)
Briumvi patient support form filled out and faxed to [phone], confirmation received.  Orders given to [facility] in [facility]

## 2022-07-21 NOTE — Telephone Encounter (Signed)
Asked intrafusion to f/u with wife to provide update.

## 2022-07-21 NOTE — Telephone Encounter (Signed)
Pt's wife Facilities manager (on [place]) calling to check on if authorization has been approved for his infusions. He is getting worse. Would like a call back. Contact at [phone]

## 2022-07-31 DIAGNOSIS — G35 Multiple sclerosis: Secondary | ICD-10-CM | POA: Diagnosis not present

## 2022-08-05 ENCOUNTER — Telehealth: Payer: Self-pay | Admitting: Neurology

## 2022-08-05 ENCOUNTER — Encounter: Payer: Self-pay | Admitting: Neurology

## 2022-08-05 NOTE — Telephone Encounter (Signed)
Printed , to sign then will send in email to pt. [email or web address].  I called pt, he could not see that received.  Asked to be sent to [email or web address].  Done.  He will call back if not received.

## 2022-08-05 NOTE — Telephone Encounter (Signed)
Pt wife called. Stated she needs to talk to nurse about getting a letter that states pt has been diagnosis with MS.

## 2022-08-05 NOTE — Telephone Encounter (Signed)
I called spoke to wife on DPR.  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  She said they are willing to accommodate him, but has meeting with boss next week.  May email letter to pt.  Will let her know once spoken to Dr. Epimenio Foot.

## 2022-08-13 DIAGNOSIS — M19011 Primary osteoarthritis, right shoulder: Secondary | ICD-10-CM | POA: Diagnosis not present

## 2022-08-13 DIAGNOSIS — G894 Chronic pain syndrome: Secondary | ICD-10-CM | POA: Diagnosis not present

## 2022-08-13 DIAGNOSIS — M75101 Unspecified rotator cuff tear or rupture of right shoulder, not specified as traumatic: Secondary | ICD-10-CM | POA: Diagnosis not present

## 2022-08-14 ENCOUNTER — Telehealth: Payer: Self-pay | Admitting: Neurology

## 2022-08-14 DIAGNOSIS — G35 Multiple sclerosis: Secondary | ICD-10-CM | POA: Diagnosis not present

## 2022-08-14 DIAGNOSIS — M25512 Pain in left shoulder: Secondary | ICD-10-CM | POA: Diagnosis not present

## 2022-08-14 DIAGNOSIS — M19011 Primary osteoarthritis, right shoulder: Secondary | ICD-10-CM | POA: Diagnosis not present

## 2022-08-14 DIAGNOSIS — M25511 Pain in right shoulder: Secondary | ICD-10-CM | POA: Diagnosis not present

## 2022-08-14 NOTE — Telephone Encounter (Signed)
Dr.Yan you are work in provider this am, Dr.Sater is out today.  Called patient wife regarding the message received from phone room. Wife said patient received his first Briumvi infusion 2 weeks ago, second infusion is scheduled today at 11:00am here at our infusion suite. Patient reports this past Tuesday he noticed what felt like a bee stinging sensation on his ear lobe, back and arms that last about 5-10 seconds which are painful when they happen, but annoying also. This has happened daily since Tuesday, patient wanted to know if this could be side effect of infusion?   Please advise

## 2022-08-14 NOTE — Telephone Encounter (Signed)
Pt's wife states pt informed her he is having new sensations all over his body.  They feel like bee stings that last 5-10 seconds and go away.  Wife reports pt is concerned re: if this a side effect to infusion therapy, please call pt to discuss.

## 2022-08-14 NOTE — Telephone Encounter (Signed)
Was able to talk with his wife, he has a lot of stress, dealing with his illness, and the work,  He has intermittent few seconds numbing stinging sensation in different location of his body, transient, no rash, this happened more than 10 days after initial Briumvi infusion,  I would suggest him continuing infusion, premedicate as routine, close observation during infusion

## 2022-08-15 ENCOUNTER — Telehealth: Payer: Self-pay | Admitting: Neurology

## 2022-08-15 ENCOUNTER — Other Ambulatory Visit: Payer: Self-pay | Admitting: Neurology

## 2022-08-15 NOTE — Telephone Encounter (Signed)
Pt's wife, Barnet Anderer requesting a letter to excuse pt from jury duty due to medical condition. He just started infusion and his not doing very well, just not a good time for him. Would like a call from the nurse when letter is ready. Would need as soon as possible before July 29.

## 2022-08-18 NOTE — Telephone Encounter (Signed)
LMVM for pts wife that need jury summons fax to [phone] to [place] to do letter.

## 2022-08-19 NOTE — Telephone Encounter (Signed)
Routing to phone room to try calling to see if the pt can send the jury summons by fax to [employer] fax number

## 2022-08-29 DIAGNOSIS — M25511 Pain in right shoulder: Secondary | ICD-10-CM | POA: Diagnosis not present

## 2022-09-10 DIAGNOSIS — M19011 Primary osteoarthritis, right shoulder: Secondary | ICD-10-CM | POA: Diagnosis not present

## 2022-09-17 ENCOUNTER — Telehealth: Payer: Self-pay | Admitting: Neurology

## 2022-09-17 NOTE — Telephone Encounter (Signed)
I called and spoke with the patient. I inquired if the surgical office had any medical clearance forms for [place] to fill out. He said they did not mention anything. I encouraged the patient to reach out to the surgery office to see if a clearance form is required. I provided the fax number to [facility] so any forms can be faxed to the POD. He will reach out to surgeons office.

## 2022-09-17 NOTE — Telephone Encounter (Signed)
Pt's wife, Amber Kisling LVM at 2:33pm. My husband need to have shoulder replacement. He is a pt of Dr. Epimenio Foot. They want to get permission  from him. Would like a call back.

## 2022-09-19 DIAGNOSIS — E119 Type 2 diabetes mellitus without complications: Secondary | ICD-10-CM | POA: Diagnosis not present

## 2022-09-23 ENCOUNTER — Telehealth: Payer: Self-pay | Admitting: Neurology

## 2022-09-23 NOTE — Telephone Encounter (Signed)
Patient is getting a shoulder replacement in the next few months. He would like a medical letter for clearance of surgery for [facility] Dr. Ranell Patrick. He would like to pick up so he can proceed and get scheduled. Please call once complete. Thank you

## 2022-09-23 NOTE — Telephone Encounter (Signed)
I called patient.  I advised him that we generally receive a medical clearance request from the surgeon's office.  If they need this filled out, we are happy to do so, and then we will fax it back.  Advised him that it does not appear that we have received this yet.  I will ask our medical records department to check into this.  Patient verbalized understanding and appreciation.

## 2022-11-06 NOTE — Telephone Encounter (Signed)
  Faxed to Dr.Norris [phone], confirmation received  placed in medical record rack.

## 2022-11-20 ENCOUNTER — Ambulatory Visit: Payer: BC Managed Care – PPO | Admitting: Neurology

## 2022-11-20 ENCOUNTER — Encounter: Payer: Self-pay | Admitting: Neurology

## 2022-11-20 VITALS — BP 137/84 | HR 76 | Ht 69.0 in | Wt 270.5 lb

## 2022-11-20 DIAGNOSIS — Z79899 Other long term (current) drug therapy: Secondary | ICD-10-CM | POA: Diagnosis not present

## 2022-11-20 DIAGNOSIS — G35 Multiple sclerosis: Secondary | ICD-10-CM | POA: Diagnosis not present

## 2022-11-20 DIAGNOSIS — R260 Ataxic gait: Secondary | ICD-10-CM

## 2022-11-20 DIAGNOSIS — R208 Other disturbances of skin sensation: Secondary | ICD-10-CM

## 2022-11-20 DIAGNOSIS — R252 Cramp and spasm: Secondary | ICD-10-CM

## 2022-11-20 NOTE — Progress Notes (Signed)
[facility]  PATIENT: Ivan Jones DOB: [birth year]  REFERRING DOCTOR OR PCP: Dr. Leonor Liv SOURCE: Patient, notes from hospitalization, imaging and lab reports, MRI images personally reviewed.  _________________________________   HISTORICAL  CHIEF COMPLAINT:  Chief Complaint  Patient presents with   Follow-up    Pt in room 11, wife in room. Here for Demyelinating disease follow up on  Briumvi .Pt reports doing okay has good days and bad days. Pt scheduled shoulder surgery on 01/09/23. Still has tingling right hand works better than left. Pt needs refill on pregablin.     HISTORY OF PRESENT ILLNESS:  Ivan Jones is a 48 y.o. man with longitudinally extensive transverse myelitis late October 2021.   Update 11/20/2022: He had an additional spinal cord plaque earlier this year and he was started on Briumvi in June 2024.   He had mild flulike symptoms with the first infusion and felt the second infusion had fewer side effects.  He was fine the next day with both  He is noting continued phasic spasms in the left arm.  He notes pain in the left posterior shoulder and the left hand due to the cramping.  Repetitive fine motor tasks and stress sometimes make the spasms worse.  There could be posturing of the left hand.  Left hand strength is mildly reduced but better than last year.  These occur several times a day and last 30-60 seconds.   Typing or wriing may trigger a spell,  We have tried several different medications.    Levetitacetam has helped some and now at 2250/day.  Has not tried a benzo.  Baclofen had no benefit.    Gabapentin helped some at the highest dose of 1200 mg tid and we switched to Pregabalin but it is no better, ecen at 450 mg / day.      He will be having a right shoulder replacement in December.     His gait is ok.  His legs get wobbly if he walks longer distance.   He walks up to 8000/steps a day though.  He has not needed his cane this year.   He still notes some weakness in the right leg at times.  He notes this more with longer distance. He has altered sensation in his trunk and legs.       Bladder function is fine.       Cognition is doing well.  History of transverse myelitis: He had Covid-19 early September 2021.     On 12/02/2019, he had pain between the shoulder blades and pain radiated to the chest.   He went [facility] ED, concerned about an MI.   He was evaluated and released.   The next day, 12/03/2019, he had the onset of severe pain in the shoulder blade region again.   Over the next 2 hours the hands became weaker and he was unable to open a door aor hold items.   He went to the [facility] one ED.  Duringt he wait, he began to have more troublle with his legs.   He had numbness in the abdomen and tingling and mild weakness in his legs.   He felt off balanced.  At his worse, he had numbness form the left shoulder down and from the right upper chest down.     He was admitted and recevied 5 days of IV Solu-Medrol (1000 mg daily).    He felt some improvement and was walking better, feeding self  but still having numbness and mild weaknes in his hands.  Legs were improved though he still needed a cane.   He continued to improve some over the next couple months but not to baseline  He has Type 2 DM and is on Janumet and insulin prn.   Sugars were very elevated during steroid therapy and he had severe insomnia.   He had Covid-19 diagnosed October 05, 2019.    Imaging studies reviewed: MRI of the cervical spine 12/03/2019 showed several T2 hyperintense foci within the spinal cord.  Small foci are noted at C3, C3-C4 and C5-C6 and a larger focus is noted from C5-C6 through T1.  There are degenerative changes.  At C5-C6 there is spinal stenosis and foraminal narrowing but no nerve root compression.  At C6-7, there is mild spinal stenosis and bilateral foraminal narrowing.  Moderate degenerative changes at other levels that do not lead to  spinal stenosis or nerve root compression.  MRI of the brain 12/04/2019 was normal.  MRI of the thoracic spine 12/04/2019 was normal.  MRI of the brain and cervical spine 01/24/2021 shows no new lesions compared to the MRIs from 2021  MRI cervical spine 06/07/2022 showed a new lesion adjacent to C3.  MRi brain 06/07/2022 was normal.   Pertinent laboratory results: CSF 12/05/2019 showed normal protein and glucose and cell count.  VDRL was negative.  No oligoclonal bands were present.  Labs 12/03/2019 through 12/06/2019: HIV is negative.  Arboviral profile was negative.  HSV type I and II are negative.  Hemoglobin A1c 9.0.  Glucoses were elevated generally above 200 while getting steroids.  01/04/2020: Pan ANCA, HTLV 1/2, hep C, CRP, ESR, ANA were negative or unremarkable.  05/22/2020: Anti-AQ4 is negative.  05/23/2021: Anti-MOG (cell-based IFA) is negative.   REVIEW OF SYSTEMS: Constitutional: No fevers, chills, sweats, or change in appetite Eyes: No visual changes, double vision, eye pain Ear, nose and throat: No hearing loss, ear pain, nasal congestion, sore throat Cardiovascular: No chest pain, palpitations Respiratory:  No shortness of breath at rest or with exertion.   No wheezes GastrointestinaI: No nausea, vomiting, diarrhea, abdominal pain, fecal incontinence Genitourinary:  No dysuria, urinary retention or frequency.  No nocturia. Musculoskeletal:  No neck pain, back pain Integumentary: No rash, pruritus, skin lesions Neurological: as above Psychiatric: No depression at this time.  No anxiety Endocrine: No palpitations, diaphoresis, change in appetite, change in weigh or increased thirst Hematologic/Lymphatic:  No anemia, purpura, petechiae. Allergic/Immunologic: No itchy/runny eyes, nasal congestion, recent allergic reactions, rashes  ALLERGIES: No Known Allergies  HOME MEDICATIONS:  Current Outpatient Medications:    ALPRAZolam (XANAX) 1 MG tablet, Take one or two before  MRi, Disp: 2 tablet, Rfl: 0   irbesartan (AVAPRO) 75 MG tablet, Take 75 mg by mouth daily., Disp: , Rfl:    JANUMET XR 50-1000 MG TB24, Take 1 tablet by mouth 2 (two) times daily., Disp: , Rfl: 1   levETIRAcetam (KEPPRA) 750 MG tablet, Two po bid, Disp: 120 tablet, Rfl: 11   meloxicam (MOBIC) 15 MG tablet, Take 15 mg by mouth daily., Disp: , Rfl:    oxyCODONE-acetaminophen (PERCOCET) 10-325 MG tablet, Take 1 tablet by mouth every 6 (six) hours as needed., Disp: , Rfl:    pregabalin (LYRICA) 300 MG capsule, One po qAM and 2 po qHS, Disp: 60 capsule, Rfl: 5   baclofen (LIORESAL) 10 MG tablet, One half to one pill po tid (Patient not taking: Reported on 05/08/2022), Disp: 90 each, Rfl:  11   FARXIGA 5 MG TABS tablet, Take 5 mg by mouth daily. (Patient not taking: Reported on 05/08/2022), Disp: , Rfl:   PAST MEDICAL HISTORY: Past Medical History:  Diagnosis Date   Arthritis    knees and shoulder   Complication of anesthesia    wakes up to fast    Diabetes mellitus without complication (HCC)    type 2   History of kidney stones    Hypertension     PAST SURGICAL HISTORY: Past Surgical History:  Procedure Laterality Date   COLON SURGERY     diverticulitis 15 inches rescected   KNEE ARTHROSCOPY     3 times   TOTAL KNEE ARTHROPLASTY Right 01/15/2017   Procedure: RIGHT TOTAL KNEE ARTHROPLASTY;  Surgeon: Jene Every, MD;  Location: [facility];  Service: Orthopedics;  Laterality: Right;  2 hrs    FAMILY HISTORY: Family History  Problem Relation Age of Onset   Heart attack Mother    Pancreatitis Mother    Cancer Father        kidney   Diabetes Father    High blood pressure Father     SOCIAL HISTORY:  Social History   Socioeconomic History   Marital status: Married    Spouse name: Not on file   Number of children: Not on file   Years of education: Not on file   Highest education level: Not on file  Occupational History   Not on file  Tobacco Use   Smoking status: Never    Smokeless tobacco: Never  Vaping Use   Vaping status: Never Used  Substance and Sexual Activity   Alcohol use: Not Currently   Drug use: No   Sexual activity: Yes  Other Topics Concern   Not on file  Social History Narrative   Right handed   Caffeine use: Diet MT Dew, Diet drinks   Lives with wife   Social Determinants of Health   Financial Resource Strain: Not on file  Food Insecurity: Not on file  Transportation Needs: Not on file  Physical Activity: Not on file  Stress: Not on file  Social Connections: Not on file  Intimate Partner Violence: Not on file     PHYSICAL EXAM  Vitals:   11/20/22 1528  BP: 137/84  Pulse: 76  Weight: 270 lb 8 oz (122.7 kg)  Height: 5\' 9"  (1.753 m)    Body mass index is 39.95 kg/m.   General: The patient is well-developed and well-nourished and in no acute distress  HEENT:  Head is [place]/AT.  Sclera are anicteric.    Skin/musculoskeletal: Extremities are without rash or  edema.  He is tender over lower neck and shoulder muscles on the left.  Normal range of motion in left shoulder though movements are mildly tender.  No tenderness over the subacromial bursa  Neurologic Exam  Mental status: The patient is alert and oriented x 3 at the time of the examination. The patient has apparent normal recent and remote memory, with an apparently normal attention span and concentration ability.   Speech is normal.  Cranial nerves: Extraocular movements are full.  Facial strength and sensation was normal.  No dysarthria is noted.  The tongue is midline, and the patient has symmetric elevation of the soft palate. No obvious hearing deficits are noted.  Motor:  Muscle bulk is normal.   Tone is normal though he has mild flexion contracture of the fourth and fifth fingers on the left, similar to last visit.  Strength is  5 / 5 in proximal arms and now 5/5 in the triceps.  Strength is 4-/5 in the intrinsic hand muscles on the left and 4+ on right.   Leg  strength was 5/5.    Sensory: Sensory testing is intact to vibration sensation in arms but reduced in legs.   He has reduced temperature sensation from C6 and below left and C7 and below on the right  Coordination: Cerebellar testing reveals good finger-nose-finger and heel-to-shin bilaterally.  Gait and station: Station is normal.   Gait is near normal.  The tandem gait is mildly wide.. Romberg is negative.   Reflexes: Deep tendon reflexes are symmetric and normal bilaterally.        DIAGNOSTIC DATA (LABS, IMAGING, TESTING) - I reviewed patient records, labs, notes, testing and imaging myself where available.  Lab Results  Component Value Date   WBC 10.0 06/12/2022   HGB 14.7 06/12/2022   HCT 44.0 06/12/2022   MCV 91 06/12/2022   PLT 264 06/12/2022      Component Value Date/Time   NA 138 12/09/2019 0555   K 4.2 12/09/2019 0555   CL 106 12/09/2019 0555   CO2 22 12/09/2019 0555   GLUCOSE 151 (H) 12/09/2019 0555   BUN 37 (H) 12/09/2019 0555   CREATININE 1.13 12/09/2019 0555   CALCIUM 9.6 12/09/2019 0555   PROT 7.3 06/12/2022 1127   ALBUMIN 4.5 06/12/2022 1127   AST 39 06/12/2022 1127   ALT 48 (H) 06/12/2022 1127   ALKPHOS 88 06/12/2022 1127   BILITOT 0.9 06/12/2022 1127   GFRNONAA >60 12/09/2019 0555   GFRAA >60 01/16/2017 0536   No results found for: "CHOL", "HDL", "LDLCALC", "LDLDIRECT", "TRIG", "CHOLHDL" Lab Results  Component Value Date   HGBA1C 9.0 (H) 12/05/2019   No results found for: "VITAMINB12" Lab Results  Component Value Date   TSH 0.264 (L) 12/05/2019       ASSESSMENT AND PLAN  Multiple sclerosis (HCC)  High risk medication use  Spasms of the hands or feet  Ataxic gait  Dysesthesia    1.  Continue Briumvi -- he had a new lesion in May 2024 helping to confirm the diagnosis of MS. 2.   stay active and exercise as tolerated. 3.   Continue Lyrica for the dysesthesias. Lamotrigine could be another medication to try if not sufficiently  improved.   4.   To help the phasic spasms, we will add tizanidine and continue levetiracetam.  Additionally, if not better in a 6-8 weeks we will change tizanidine to clonazepam.  We also discussed that if none of the pills help much we could consider Botox. 5.    Return in 6 months or sooner if there are new or worsening neurologic symptoms.        Alys Dulak A. Epimenio Foot, MD, [facility] 11/20/2022, 4:25 PM Certified in Neurology, Clinical Neurophysiology, Sleep Medicine and Neuroimaging  [facility] [address], [address] [place], [place] [address] [phone]

## 2022-11-25 ENCOUNTER — Telehealth: Payer: Self-pay | Admitting: *Deleted

## 2022-11-25 MED ORDER — TIZANIDINE HCL 4 MG PO TABS: ORAL_TABLET | ORAL | 5 refills | Status: DC

## 2022-11-25 NOTE — Telephone Encounter (Signed)
Took call from Jael in phone room and spoke w/ pt. Reviewed plan from visit 11/20/22 with Dr. Epimenio Foot below. He states tizanidine never called into [facility]. Aware I will send to Dr. Epimenio Foot to call in today for him. He verbalized understanding and appreciation.

## 2022-12-15 DIAGNOSIS — M19011 Primary osteoarthritis, right shoulder: Secondary | ICD-10-CM | POA: Diagnosis not present

## 2022-12-15 DIAGNOSIS — G894 Chronic pain syndrome: Secondary | ICD-10-CM | POA: Diagnosis not present

## 2022-12-15 DIAGNOSIS — M75101 Unspecified rotator cuff tear or rupture of right shoulder, not specified as traumatic: Secondary | ICD-10-CM | POA: Diagnosis not present

## 2022-12-22 NOTE — Progress Notes (Signed)
Sent message, via epic in basket, requesting orders in epic from surgeon.  

## 2022-12-23 NOTE — H&P (Signed)
Patient's anticipated LOS is less than 2 midnights, meeting these requirements: - Younger than 9 - Lives within 1 hour of care - Has a competent adult at home to recover with post-op recover - NO history of  - Chronic pain requiring opiods  - Diabetes  - Coronary Artery Disease  - Heart failure  - Heart attack  - Stroke  - DVT/VTE  - Cardiac arrhythmia  - Respiratory Failure/COPD  - Renal failure  - Anemia  - Advanced Liver disease     Ivan Jones is an 47 y.o. male.    Chief Complaint: right shoulder pain  HPI: Pt is a 47 y.o. male complaining of right shoulder pain for multiple years. Pain had continually increased since the beginning. X-rays in the clinic show end-stage arthritic changes of the right shoulder. Pt has tried various conservative treatments which have failed to alleviate their symptoms, including injections and therapy. Various options are discussed with the patient. Risks, benefits and expectations were discussed with the patient. Patient understand the risks, benefits and expectations and wishes to proceed with surgery.   PCP:  Marylen Ponto, MD  D/C Plans: Home  PMH: Past Medical History:  Diagnosis Date   Arthritis    knees and shoulder   Complication of anesthesia    wakes up to fast    Diabetes mellitus without complication (HCC)    type 2   History of kidney stones    Hypertension     PSH: Past Surgical History:  Procedure Laterality Date   COLON SURGERY     diverticulitis 15 inches rescected   KNEE ARTHROSCOPY     3 times   TOTAL KNEE ARTHROPLASTY Right 01/15/2017   Procedure: RIGHT TOTAL KNEE ARTHROPLASTY;  Surgeon: Jene Every, MD;  Location: [facility];  Service: Orthopedics;  Laterality: Right;  2 hrs    Social History:  reports that he has never smoked. He has never used smokeless tobacco. He reports that he does not currently use alcohol. He reports that he does not use drugs. BMI: Estimated body mass index is 39.95  kg/m as calculated from the following:   Height as of 11/20/22: 5\' 9"  (1.753 m).   Weight as of 11/20/22: 122.7 kg.    Diabetes:   Patient has a diagnosis of diabetes,  Lab Results  Component Value Date   HGBA1C 9.0 (H) 12/05/2019   Smoking Status:   reports that he has never smoked. He has never used smokeless tobacco.    Allergies:  No Known Allergies  Medications: No current facility-administered medications for this encounter.   Current Outpatient Medications  Medication Sig Dispense Refill   ALPRAZolam (XANAX) 1 MG tablet Take one or two before MRi 2 tablet 0   baclofen (LIORESAL) 10 MG tablet One half to one pill po tid (Patient not taking: Reported on 05/08/2022) 90 each 11   FARXIGA 5 MG TABS tablet Take 5 mg by mouth daily. (Patient not taking: Reported on 05/08/2022)     irbesartan (AVAPRO) 75 MG tablet Take 75 mg by mouth daily.     JANUMET XR 50-1000 MG TB24 Take 1 tablet by mouth 2 (two) times daily.  1   levETIRAcetam (KEPPRA) 750 MG tablet Two po bid 120 tablet 11   meloxicam (MOBIC) 15 MG tablet Take 15 mg by mouth daily.     oxyCODONE-acetaminophen (PERCOCET) 10-325 MG tablet Take 1 tablet by mouth every 6 (six) hours as needed.     pregabalin (LYRICA) 300  MG capsule One po qAM and 2 po qHS 60 capsule 5   tiZANidine (ZANAFLEX) 4 MG tablet Take 1/2 to 1 po tid 90 tablet 5    No results found for this or any previous visit (from the past 48 hour(s)). No results found.  ROS: Pain with rom of the right upper extremity  Physical Exam: Alert and oriented 47 y.o. male in no acute distress Cranial nerves 2-12 intact Cervical spine: full rom with no tenderness, nv intact distally Chest: active breath sounds bilaterally, no wheeze rhonchi or rales Heart: regular rate and rhythm, no murmur Abd: non tender non distended with active bowel sounds Hip is stable with rom  Right shoulder painful rom with crepitus Nv intact distally No rashes or edema  distally  Assessment/Plan Assessment: right shoulder end stage osteoarthritis  Plan:  Patient will undergo a right total shoulder by Dr. Ranell Patrick at [place] Risks benefits and expectations were discussed with the patient. Patient understand risks, benefits and expectations and wishes to proceed. Preoperative templating of the joint replacement has been completed, documented, and submitted to the Operating Room personnel in order to optimize intra-operative equipment management.   Alphonsa Overall PA-C, MPAS [facility] is now [employer] [address], [place], [place] [address] Phone: [phone] [email or web address] Facebook  [employer]

## 2022-12-26 ENCOUNTER — Other Ambulatory Visit: Payer: Self-pay | Admitting: Neurology

## 2022-12-28 NOTE — Progress Notes (Addendum)
COVID Vaccine received:  []  No [x]  Yes Date of any COVID positive Test in last 90 days:  None  PCP - Juleen China, MD medical clearance scanned in Media   Cardiologist - none Neurology- Despina Arias, MD neurology clearance scanned in Media Pain Mgmt:- Dr. Sheran Luz and Deneise Lever, NP    Office is aware of surgery  Chest x-ray -  EKG - (12-03-2019 Epic)  will repeat   Stress Test -  ECHO -  Cardiac Cath -   PCR screen: [x]  Ordered & Completed []   No Order but Needs PROFEND     []   N/A for this surgery  Surgery Plan:  [x]  Ambulatory   []  Outpatient in bed  []  Admit Anesthesia:    []  General  []  Spinal  [x]   Choice []   MAC  Pacemaker / ICD device [x]  No []  Yes   Spinal Cord Stimulator:[x]  No []  Yes       History of Sleep Apnea? [x]  No []  Yes   CPAP used?- [x]  No []  Yes    Does the patient monitor blood sugar?   []  N/A   [x]  No []  Yes  Patient has: []  NO Hx DM   []  Pre-DM   []  DM1  [x]   DM2 Last A1c was:  7.0 on 05-19-22    Will repeat at PST     Does patient have a Freestyle [place] or Dexacom? [x]  No []  Yes   Fasting Blood Sugar Ranges- ? Checks Blood Sugar __0  times a day  Diabetic medications/ instructions: Sitagliptin-Metformin (Janumet),  Hold DOS  Blood Thinner / Instructions:  none Aspirin Instructions:  none  ERAS Protocol Ordered: []  No  [x]  Yes PRE-SURGERY []  ENSURE  [x]  G2  Patient is to be NPO after: 06:00  Dental hx: []  Dentures:  [x]  N/A      []  Bridge or Partial:                   []  Loose or Damaged teeth:   Comments: Patient was given the 5 CHG shower / bath instructions for Total Shoulder arthroplasty surgery along with 2 bottles of the CHG soap. Patient will start this on: Monday  01-05-2023 All questions were asked and answered, Patient voiced understanding of this process.   The patient was given Benzoyl peroxide Gel as ordered. Instruction regarding application starting 2 days prior to surgery was given and patient voiced understanding.    Activity level: Patient is unable to climb a flight of stairs without difficulty; [x]  No CP  [x]  No SOB.  Patient can perform ADLs without assistance.   Anesthesia review: DM2, HTN, transverse myelitis/ MS- transfusions Briumvi, ataxic gait, hand tremors- weakness,  Hx of waking up too fast with anesthesia. CPS  Patient denies shortness of breath, fever, cough and chest pain at PAT appointment.  Patient verbalized understanding and agreement to the Pre-Surgical Instructions that were given to them at this PAT appointment. Patient was also educated of the need to review these PAT instructions again prior to his surgery.I reviewed the appropriate phone numbers to call if they have any and questions or concerns.

## 2022-12-28 NOTE — Patient Instructions (Addendum)
SURGICAL WAITING ROOM VISITATION Patients having surgery or a procedure may have no more than 2 support people in the waiting area - these visitors may rotate in the visitor waiting room.   Due to an increase in RSV and influenza rates and associated hospitalizations, children ages 61 and under may not visit patients in [facility] hospitals. If the patient needs to stay at the hospital during part of their recovery, the visitor guidelines for inpatient rooms apply.  PRE-OP VISITATION  Pre-op nurse will coordinate an appropriate time for 1 support person to accompany the patient in pre-op.  This support person may not rotate.  This visitor will be contacted when the time is appropriate for the visitor to come back in the pre-op area.  Please refer to the [facility] website for the visitor guidelines for Inpatients (after your surgery is over and you are in a regular room).  You are not required to quarantine at this time prior to your surgery. However, you must do this: Hand Hygiene often Do NOT share personal items Notify your provider if you are in close contact with someone who has COVID or you develop fever 100.4 or greater, new onset of sneezing, cough, sore throat, shortness of breath or body aches.  If you test positive for Covid or have been in contact with anyone that has tested positive in the last 10 days please notify you surgeon.    Your procedure is scheduled on:  FRIDAY  January 09, 2023  Report to [facility] Main Entrance: [facility] entrance where the [employer] is available.   Report to admitting at:   06:30  AM  Call this number if you have any questions or problems the morning of surgery [phone]  Do not eat food after Midnight the night prior to your surgery/procedure.  After Midnight you may have the following liquids until  06:00 AM DAY OF SURGERY  Clear Liquid Diet Water Black Coffee (sugar ok, NO MILK/CREAM OR CREAMERS)  Tea (sugar ok, NO  MILK/CREAM OR CREAMERS) regular and decaf                             Plain Jell-O  with no fruit (NO RED)                                           Fruit ices (not with fruit pulp, NO RED)                                     Popsicles (NO RED)                                                                  Juice: NO CITRUS JUICES: only apple, WHITE grape, WHITE cranberry Sports drinks like Gatorade or Powerade (NO RED)                    The day of surgery:  Drink ONE (1) Pre-Surgery G2 at  06:30 AM the morning of surgery. Drink  in one sitting. Do not sip.  This drink was given to you during your hospital pre-op appointment visit. Nothing else to drink after completing the Pre-Surgery Clear G2 : No candy, chewing gum or throat lozenges.    FOLLOW ANY ADDITIONAL PRE OP INSTRUCTIONS YOU RECEIVED FROM YOUR SURGEON'S OFFICE!!!   Oral Hygiene is also important to reduce your risk of infection.        Remember - BRUSH YOUR TEETH THE MORNING OF SURGERY WITH YOUR REGULAR TOOTHPASTE  Do NOT smoke after Midnight the night before surgery.   Sitagliptin-Metformin (Janumet)-  Day before surgery:  Take as usual.    DAY OF SURGERY:  DO NOT TAKE JANUMET.   STOP TAKING all Vitamins, Herbs and supplements 1 week before your surgery.   Take ONLY these medicines the morning of surgery with A SIP OF WATER: Levetiracetam (Keppra).  You may take Oxycodone-APAP as needed for moderate to severe pain.                 You may not have any metal on your body including jewelry, and body piercing  Do not wear lotions, powders, cologne, or deodorant  Men may shave face and neck.  Contacts, Hearing Aids, dentures or bridgework may not be worn into surgery. DENTURES WILL BE REMOVED PRIOR TO SURGERY PLEASE DO NOT APPLY "Poly grip" OR ADHESIVES!!!  Patients discharged on the day of surgery will not be allowed to drive home.  Someone NEEDS to stay with you for the first 24 hours after anesthesia.  Do not  bring your home medications to the hospital. The Pharmacy will dispense medications listed on your medication list to you during your admission in the Hospital.  Please read over the following fact sheets you were given: IF YOU HAVE QUESTIONS ABOUT YOUR PRE-OP INSTRUCTIONS, PLEASE CALL [phone].     Pre-operative 5 CHG Bath Instructions   You can play a key role in reducing the risk of infection after surgery. Your skin needs to be as free of germs as possible. You can reduce the number of germs on your skin by washing with CHG (chlorhexidine gluconate) soap before surgery. CHG is an antiseptic soap that kills germs and continues to kill germs even after washing.   DO NOT use if you have an allergy to chlorhexidine/CHG or antibacterial soaps. If your skin becomes reddened or irritated, stop using the CHG and notify one of our RNs at [phone]  Please shower with the CHG soap starting 4 days before surgery using the following schedule: START SHOWERS ON  Monday  January 05, 2023  Please keep in mind the following:  DO NOT shave, including legs and underarms, starting the day of your first shower.   You may shave your face at any point before/day of surgery.   Place clean sheets on your bed the day you start using CHG soap. Use a clean washcloth (not used since being washed) for each shower. DO NOT sleep with pets once you start using the CHG.   CHG Shower Instructions:  If you choose to wash your hair and private area, wash first with your normal shampoo/soap.  After you use shampoo/soap, rinse your hair and body thoroughly to remove shampoo/soap residue.  Turn the water OFF and apply about 3 tablespoons (45 ml) of CHG soap to a CLEAN washcloth.  Apply CHG soap ONLY FROM YOUR NECK DOWN TO YOUR TOES  (washing for 3-5 minutes)  DO NOT use CHG soap on face, private areas, open wounds, or sores.  Pay special attention to the area where your surgery is being performed.  If you are having back surgery, having someone wash your back for you may be helpful.  Wait 2 minutes after CHG soap is applied, then you may rinse off the CHG soap.  Pat dry with a clean towel  Put on clean clothes/pajamas   If you choose to wear lotion, please use ONLY the CHG-compatible lotions on the back of this paper.     Additional instructions for the day of surgery: DO NOT APPLY any lotions, deodorants, cologne, or perfumes.   Put on clean/comfortable clothes.  Brush your teeth.  Ask your nurse before applying any prescription medications to the skin.      CHG Compatible Lotions   Aveeno Moisturizing lotion  Cetaphil Moisturizing Cream  Cetaphil Moisturizing Lotion  Clairol Herbal Essence Moisturizing Lotion, Dry Skin  Clairol Herbal Essence Moisturizing Lotion, Extra Dry Skin  Clairol Herbal Essence Moisturizing Lotion, Normal Skin  Curel Age Defying Therapeutic Moisturizing Lotion with Alpha Hydroxy  Curel Extreme Care Body Lotion  Curel Soothing Hands Moisturizing Hand Lotion  Curel Therapeutic Moisturizing Cream, Fragrance-Free  Curel Therapeutic Moisturizing Lotion, Fragrance-Free  Curel Therapeutic Moisturizing Lotion, Original Formula  Eucerin Daily Replenishing Lotion  Eucerin Dry Skin Therapy Plus Alpha Hydroxy Crme  Eucerin Dry Skin Therapy Plus Alpha Hydroxy Lotion  Eucerin Original Crme  Eucerin Original Lotion  Eucerin Plus Crme Eucerin Plus Lotion  Eucerin TriLipid Replenishing Lotion  Keri Anti-Bacterial Hand Lotion  Keri Deep Conditioning Original Lotion Dry Skin Formula Softly Scented  Keri Deep Conditioning Original Lotion, Fragrance Free Sensitive Skin Formula  Keri Lotion Fast Absorbing Fragrance Free Sensitive Skin Formula  Keri Lotion Fast Absorbing Softly Scented Dry Skin  Formula  Keri Original Lotion  Keri Skin Renewal Lotion Keri Silky Smooth Lotion  Keri Silky Smooth Sensitive Skin Lotion  Nivea Body Creamy Conditioning Oil  Nivea Body Extra Enriched Lotion  Nivea Body Original Lotion  Nivea Body Sheer Moisturizing Lotion Nivea Crme  Nivea Skin Firming Lotion  NutraDerm 30 Skin Lotion  NutraDerm Skin Lotion  NutraDerm Therapeutic Skin Cream  NutraDerm Therapeutic Skin Lotion  ProShield Protective Hand Cream  Provon moisturizing lotion   Preparing for Total Shoulder Arthroplasty ================================================================= Please follow these instructions carefully, in addition to any other special Bathing information that was explained to you at the Presurgical Appointment:  BENZOYL PEROXIDE 5% GEL: Used to kill bacteria on the skin which could cause an infection at the surgery site.   Please do not use if you have an allergy to  benzoyl peroxide. If your skin becomes reddened/irritated stop using the benzoyl peroxide and inform your Doctor.   Starting two days before surgery, apply as follows:  1. Apply benzoyl peroxide gel in the morning and at night. Apply after taking a shower. If you are not taking a shower, clean entire shoulder front, back, and side, along with the armpit with a clean wet washcloth.  2. Place a quarter-sized dollop of the gel on your SHOULDER and rub in thoroughly, making sure to cover the front, back, and side of your shoulder, along with the armpit.   2 Days prior to Surgery      [facility]  01-07-2023 First Application _______ Morning Second Application _______ Night  Day Before Surgery     THURSDAY  01-08-2023 First Application______ Morning  On the night before surgery, wash your entire body (except hair, face and private areas) with CHG Soap. THEN, rub in the LAST application of the Benzoyl Peroxide Gel on your shoulder.   3. On the Morning of Surgery wash your BODY AGAIN with CHG Soap (except  hair, face and private areas)  4. DO NOT USE THE BENZOYL PEROXIDE GEL ON THE DAY OF YOUR SURGERY       FAILURE TO FOLLOW THESE INSTRUCTIONS MAY RESULT IN THE CANCELLATION OF YOUR SURGERY  PATIENT SIGNATURE_________________________________  NURSE SIGNATURE__________________________________  ________________________________________________________________________      Ivan Jones    An incentive spirometer is a tool that can help keep your lungs clear and active. This tool measures how well you are filling your lungs with each breath. Taking long deep breaths may help reverse or decrease the chance of developing breathing (pulmonary) problems (especially infection) following: A long period of time when you are unable to move or be active. BEFORE THE PROCEDURE  If the spirometer includes an indicator to show your best effort, your nurse or respiratory therapist will set it to a desired goal. If possible, sit up straight or lean slightly forward. Try not to slouch. Hold the incentive spirometer in an upright position. INSTRUCTIONS FOR USE  Sit on the edge of your bed if possible, or sit up as far as you can in bed or on a chair. Hold the incentive spirometer in an upright position. Breathe out normally. Place the mouthpiece in your mouth and seal your lips tightly around it. Breathe in slowly and as deeply as possible, raising the piston or the ball toward the top of the column. Hold your breath for 3-5 seconds or for as long as possible. Allow the piston or ball to fall to the bottom of the column. Remove the mouthpiece from your mouth and breathe out normally. Rest for a few seconds and repeat Steps 1 through 7 at least 10 times every 1-2 hours when you are awake. Take your time and take a few normal breaths between deep breaths. The spirometer may include an indicator to show your best effort. Use the indicator as a goal to work toward during each repetition. After each  set of 10 deep breaths, practice coughing to be sure your lungs are clear. If you have an incision (the cut made at the time of surgery), support your incision when coughing by placing a pillow or rolled up towels firmly against it. Once you are able to get out of bed, walk around indoors and cough well. You may stop using the incentive spirometer when instructed by your caregiver.  RISKS AND COMPLICATIONS Take your time so you do not get dizzy  or light-headed. If you are in pain, you may need to take or ask for pain medication before doing incentive spirometry. It is harder to take a deep breath if you are having pain. AFTER USE Rest and breathe slowly and easily. It can be helpful to keep track of a log of your progress. Your caregiver can provide you with a simple table to help with this. If you are using the spirometer at home, follow these instructions: SEEK MEDICAL CARE IF:  You are having difficultly using the spirometer. You have trouble using the spirometer as often as instructed. Your pain medication is not giving enough relief while using the spirometer. You develop fever of 100.5 F (38.1 C) or higher.                                                                                                    SEEK IMMEDIATE MEDICAL CARE IF:  You cough up bloody sputum that had not been present before. You develop fever of 102 F (38.9 C) or greater. You develop worsening pain at or near the incision site. MAKE SURE YOU:  Understand these instructions. Will watch your condition. Will get help right away if you are not doing well or get worse. Document Released: 06/02/2006 Document Revised: 04/14/2011 Document Reviewed: 08/03/2006 [facility] Patient Information 2014 [place], [place].

## 2022-12-29 ENCOUNTER — Encounter (HOSPITAL_COMMUNITY): Payer: Self-pay

## 2022-12-29 ENCOUNTER — Encounter (HOSPITAL_COMMUNITY)
Admission: RE | Admit: 2022-12-29 | Discharge: 2022-12-29 | Disposition: A | Discharge status: Home / Self Care | Payer: BC Managed Care – PPO | Source: Ambulatory Visit | Attending: Orthopedic Surgery | Admitting: Orthopedic Surgery

## 2022-12-29 ENCOUNTER — Other Ambulatory Visit: Payer: Self-pay

## 2022-12-29 VITALS — BP 118/75 | HR 82 | Temp 98.6°F | Resp 18 | Ht 69.0 in | Wt 266.0 lb

## 2022-12-29 DIAGNOSIS — I10 Essential (primary) hypertension: Secondary | ICD-10-CM

## 2022-12-29 DIAGNOSIS — Z79891 Long term (current) use of opiate analgesic: Secondary | ICD-10-CM

## 2022-12-29 DIAGNOSIS — E119 Type 2 diabetes mellitus without complications: Secondary | ICD-10-CM | POA: Diagnosis not present

## 2022-12-29 DIAGNOSIS — Z01818 Encounter for other preprocedural examination: Secondary | ICD-10-CM

## 2022-12-29 HISTORY — DX: Chronic pain syndrome: G89.4

## 2022-12-29 HISTORY — DX: Myoneural disorder, unspecified: G70.9

## 2022-12-29 LAB — COMPREHENSIVE METABOLIC PANEL
ALT: 62 U/L — ABNORMAL HIGH (ref 0–44)
AST: 40 U/L (ref 15–41)
Albumin: 4.2 g/dL (ref 3.5–5.0)
Alkaline Phosphatase: 74 U/L (ref 38–126)
Anion gap: 9 (ref 5–15)
BUN: 20 mg/dL (ref 6–20)
CO2: 24 mmol/L (ref 22–32)
Calcium: 9.6 mg/dL (ref 8.9–10.3)
Chloride: 102 mmol/L (ref 98–111)
Creatinine, Ser: 1.13 mg/dL (ref 0.61–1.24)
GFR, Estimated: >60 mL/min (ref >60)
Glucose, Bld: 202 mg/dL — ABNORMAL HIGH (ref 70–99)
Potassium: 4.6 mmol/L (ref 3.5–5.1)
Sodium: 135 mmol/L (ref 135–145)
Total Bilirubin: 0.7 mg/dL (ref <1.2)
Total Protein: 7.6 g/dL (ref 6.5–8.1)

## 2022-12-29 LAB — CBC
HCT: 45.1 % (ref 39.0–52.0)
Hemoglobin: 15 g/dL (ref 13.0–17.0)
MCH: 30.6 pg (ref 26.0–34.0)
MCHC: 33.3 g/dL (ref 30.0–36.0)
MCV: 92 fL (ref 80.0–100.0)
Platelets: 272 10*3/uL (ref 150–400)
RBC: 4.9 MIL/uL (ref 4.22–5.81)
RDW: 12.1 % (ref 11.5–15.5)
WBC: 11.6 10*3/uL — ABNORMAL HIGH (ref 4.0–10.5)
nRBC: 0 % (ref 0.0–0.2)

## 2022-12-29 LAB — HEMOGLOBIN A1C
Hgb A1c MFr Bld: 8.3 % — ABNORMAL HIGH (ref 4.8–5.6)
Mean Plasma Glucose: 191.51 mg/dL

## 2022-12-29 LAB — GLUCOSE, CAPILLARY: Glucose-Capillary: 197 mg/dL — ABNORMAL HIGH (ref 70–99)

## 2022-12-29 LAB — SURGICAL PCR SCREEN
MRSA, PCR: NEGATIVE
Staphylococcus aureus: NEGATIVE

## 2022-12-29 NOTE — Telephone Encounter (Signed)
Last seen on 11/20/22 Follow up scheduled on 05/28/23

## 2022-12-30 ENCOUNTER — Encounter (HOSPITAL_COMMUNITY): Payer: Self-pay | Admitting: Physician Assistant

## 2022-12-30 NOTE — Progress Notes (Addendum)
Anesthesia Chart Review   Case: [id number] Date/Time: 01/09/23 0845   Procedure: TOTAL SHOULDER ARTHROPLASTY (Right: Shoulder) - interscalene block  120   Anesthesia type: Choice   Pre-op diagnosis: Right shoulder osteoarthritis   Location: [facility] ROOM 06 / [facility]   Surgeons: Beverely Low, MD       DISCUSSION:47 y.o. never smoker with h/o HTN, DM II, transverse myleitis, right soulder OA scheduled for above procedure 01/09/2023 with Dr. Beverely Low.   A1C 8.3, forwarded to surgeon and PCP. Dr. Ranell Patrick ok proceeding with A1C 8.3.  Evaluate blood glucose DOS.   Clearance received from PCP which states pt is cleared at low risk for planned procedure.   Clearance received from neurologist which states pt is cleared for planned procedure with on changes in medication needed.  VS: BP 118/75 Comment: right arm sitting  Pulse 82   Temp 37 C   Resp 18   Ht 5\' 9"  (1.753 m)   Wt 120.7 kg   SpO2 100%   BMI 39.28 kg/m   PROVIDERS: Marylen Ponto, MD is PCP    LABS:  labs forwarded to surgeon and PCP  (all labs ordered are listed, but only abnormal results are displayed)  Labs Reviewed  HEMOGLOBIN A1C - Abnormal; Notable for the following components:      Result Value   Hgb A1c MFr Bld 8.3 (*)    All other components within normal limits  COMPREHENSIVE METABOLIC PANEL - Abnormal; Notable for the following components:   Glucose, Bld 202 (*)    ALT 62 (*)    All other components within normal limits  CBC - Abnormal; Notable for the following components:   WBC 11.6 (*)    All other components within normal limits  GLUCOSE, CAPILLARY - Abnormal; Notable for the following components:   Glucose-Capillary 197 (*)    All other components within normal limits  SURGICAL PCR SCREEN     IMAGES:   EKG:   CV:  Past Medical History:  Diagnosis Date   Arthritis    knees and shoulder   Chronic pain syndrome    Complication of anesthesia    wakes up during the end of surgery    Diabetes mellitus without complication (HCC)    type 2   History of kidney stones    Hypertension    Neuromuscular disorder (HCC)    Transverse Myelitis- now has MS, has ataxic gait, hand tremors    Past Surgical History:  Procedure Laterality Date   COLON SURGERY  2018   diverticulitis 15 inches rescected   KNEE ARTHROSCOPY Right    3 times   TOTAL KNEE ARTHROPLASTY Right 01/15/2017   Procedure: RIGHT TOTAL KNEE ARTHROPLASTY;  Surgeon: Jene Every, MD;  Location: [facility];  Service: Orthopedics;  Laterality: Right;  2 hrs    MEDICATIONS:  ALPRAZolam (XANAX) 1 MG tablet   baclofen (LIORESAL) 10 MG tablet   irbesartan (AVAPRO) 75 MG tablet   levETIRAcetam (KEPPRA) 750 MG tablet   meloxicam (MOBIC) 15 MG tablet   NON FORMULARY   oxyCODONE-acetaminophen (PERCOCET) 10-325 MG tablet   pregabalin (LYRICA) 300 MG capsule   SitaGLIPtin-MetFORMIN HCl 406 399 6121 MG TB24   tamsulosin (FLOMAX) 0.4 MG CAPS capsule   tiZANidine (ZANAFLEX) 4 MG tablet   trolamine salicylate (BLUE-EMU HEMP) 10 % cream   No current facility-administered medications for this encounter.    Jodell Cipro Ward, PA-C [facility]-Surgical [facility] [phone]

## 2023-01-15 DIAGNOSIS — G35 Multiple sclerosis: Secondary | ICD-10-CM | POA: Diagnosis not present

## 2023-01-16 DIAGNOSIS — Z23 Encounter for immunization: Secondary | ICD-10-CM | POA: Diagnosis not present

## 2023-01-19 ENCOUNTER — Telehealth: Payer: Self-pay | Admitting: Neurology

## 2023-01-19 DIAGNOSIS — L0102 Bockhart's impetigo: Secondary | ICD-10-CM | POA: Diagnosis not present

## 2023-01-19 DIAGNOSIS — Z6839 Body mass index (BMI) 39.0-39.9, adult: Secondary | ICD-10-CM | POA: Diagnosis not present

## 2023-01-19 NOTE — Telephone Encounter (Signed)
I called wife of pt.  He went to see pcp and was placed on ABX and cream for L face and neck shingles (pain/knots).  Not started treatment yet.  No hx of shingles previously.  Had briumvi last Thursday.  (3rd round).  Wanted to let you know.  If anything to add will call back.

## 2023-01-19 NOTE — Telephone Encounter (Signed)
Wife has called to report pt has possibly come down with shingles, she reports they are trying to get pt in with his PCP, she wanted to report this to Dr Epimenio Foot.

## 2023-01-20 NOTE — Telephone Encounter (Signed)
Spoke to [employer] Charity fundraiser at [employer].  She said pt diagnosed with impetigo follicularis, not shingles.  Per below was given oral and topical ABX.

## 2023-01-20 NOTE — Telephone Encounter (Signed)
I called pharmacy and pharmacist said that pt was only given Bactrim for 10 days and Bactrim cream BID. I called and LMVM for pcp to call us back.  (? If shingles).  To start on antiviral.

## 2023-03-05 DIAGNOSIS — M19011 Primary osteoarthritis, right shoulder: Secondary | ICD-10-CM | POA: Diagnosis not present

## 2023-03-05 DIAGNOSIS — M25512 Pain in left shoulder: Secondary | ICD-10-CM | POA: Diagnosis not present

## 2023-04-03 ENCOUNTER — Ambulatory Visit (HOSPITAL_COMMUNITY)
Admission: RE | Admit: 2023-04-03 | Payer: BC Managed Care – PPO | Source: Home / Self Care | Admitting: Orthopedic Surgery

## 2023-04-03 ENCOUNTER — Encounter (HOSPITAL_COMMUNITY): Admission: RE | Payer: Self-pay | Source: Home / Self Care

## 2023-04-03 SURGERY — ARTHROPLASTY, SHOULDER, TOTAL
Anesthesia: Choice | Site: Shoulder | Laterality: Right

## 2023-04-14 DIAGNOSIS — Z79899 Other long term (current) drug therapy: Secondary | ICD-10-CM | POA: Diagnosis not present

## 2023-04-14 DIAGNOSIS — Z5181 Encounter for therapeutic drug level monitoring: Secondary | ICD-10-CM | POA: Diagnosis not present

## 2023-04-14 DIAGNOSIS — M75101 Unspecified rotator cuff tear or rupture of right shoulder, not specified as traumatic: Secondary | ICD-10-CM | POA: Diagnosis not present

## 2023-04-14 DIAGNOSIS — G894 Chronic pain syndrome: Secondary | ICD-10-CM | POA: Diagnosis not present

## 2023-04-14 DIAGNOSIS — M19011 Primary osteoarthritis, right shoulder: Secondary | ICD-10-CM | POA: Diagnosis not present

## 2023-05-16 ENCOUNTER — Other Ambulatory Visit: Payer: Self-pay | Admitting: Neurology

## 2023-05-19 ENCOUNTER — Encounter: Payer: Self-pay | Admitting: Neurology

## 2023-05-19 ENCOUNTER — Telehealth: Payer: Self-pay | Admitting: Neurology

## 2023-05-19 NOTE — Telephone Encounter (Signed)
 LVM and sent letter in mail informing pt of need to reschedule 05/28/23 appt - MD in meeting  If pt calls back to r/s you can offer 05/28/23 at either 9am or 9:30am

## 2023-05-22 ENCOUNTER — Other Ambulatory Visit: Payer: Self-pay | Admitting: Neurology

## 2023-05-25 NOTE — Telephone Encounter (Signed)
 Last seen on 11/20/22 Follow up scheduled on 07/07/23

## 2023-05-28 ENCOUNTER — Ambulatory Visit: Payer: BC Managed Care – PPO | Admitting: Neurology

## 2023-06-03 NOTE — Telephone Encounter (Signed)
 Spoke to patient has appointment 06/04/2023 with Dr. Godwin Lat

## 2023-06-04 ENCOUNTER — Encounter: Payer: Self-pay | Admitting: Neurology

## 2023-06-04 ENCOUNTER — Ambulatory Visit: Admitting: Neurology

## 2023-06-04 VITALS — BP 142/95 | HR 78 | Ht 69.0 in | Wt 256.0 lb

## 2023-06-04 DIAGNOSIS — Q069 Congenital malformation of spinal cord, unspecified: Secondary | ICD-10-CM | POA: Diagnosis not present

## 2023-06-04 DIAGNOSIS — G35 Multiple sclerosis: Secondary | ICD-10-CM | POA: Diagnosis not present

## 2023-06-04 DIAGNOSIS — Z79899 Other long term (current) drug therapy: Secondary | ICD-10-CM | POA: Diagnosis not present

## 2023-06-04 MED ORDER — TIZANIDINE HCL 4 MG PO TABS: ORAL_TABLET | ORAL | 3 refills | Status: AC

## 2023-06-04 MED ORDER — LEVETIRACETAM 750 MG PO TABS: ORAL_TABLET | ORAL | 3 refills | Status: AC

## 2023-06-04 NOTE — Progress Notes (Signed)
 [facility]  PATIENT: Ivan Jones DOB: [birth year]  REFERRING DOCTOR OR PCP: Dr. Geraldo Klippel SOURCE: Patient, notes from hospitalization, imaging and lab reports, MRI images personally reviewed.  _________________________________   HISTORICAL  CHIEF COMPLAINT:  Chief Complaint  Patient presents with   Follow-up    Pt in 10 alone Pt here for MS Pt states increased fatigue when closer to him getting infusion     HISTORY OF PRESENT ILLNESS:  Ivan Jones is a 48 y.o. man with longitudinally extensive transverse myelitis late October 2021.   Update 11/20/2022: Mr. [name] is complex.  MRI was consistent with a longitudinally extensive transverse myelitis in 2021.  Initial follow-up MRIs did not show any new foci and the edema was greatly resolved.  A follow-up MRI in 2024 appear to show a new focus adjacent to C3.  He had several other discrete foci in the cervical spine.  At the time, I felt that this additional focus meets criteria for multiple sclerosis given the other findings.  He was started on Briumvi.  CSF had been normal (no oligoclonal bands).  I had the opportunity to review all of his films this morning.  Although the focus at C3 did not appear to be on an MRI from 2022, it does appear to be on the initial MRI and the first follow-up MRI.  Therefore, this may not be a new focus.  This information changes my opinion that he has definite MS and I now think the likelihood is more 50-50.  Looking at the MRIs, the old lesions show T2 hyperintense changes in the anterior horn and this pattern could also be seen with spinal cord infarction.  He does have risk factor of diabetes and hypertension.  He has phasic spasms in the left arm occurring several times a week on average.  He notes pain in the left posterior shoulder and the left hand due to the cramping.  Repetitive fine motor tasks and stress increase the likelihood of spasms.  When 1 occurs, there is  posturing of the left hand lasting 30 to 60 seconds.  He continues to have some left hand weakness.  We have tried several different medications.    Levetitacetam has helped some and now at 2250/day.  Has not tried a benzo.  Baclofen  had no benefit.    Gabapentin  may have helped though he did not think pregabalin  helped and it was discontinued.     He will be having a right shoulder replacement in December 2025 (needs to get DM under better control)  His gait is ok.  His legs get wobbly if he walks longer distance.   He walks up to 8000/steps a day though.  He has not needed his cane this year.  He still notes some weakness in the right leg at times.  He notes this more with longer distance. He has altered sensation in his trunk and legs.       Bladder function is fine.       Cognition is doing well.  History of transverse myelitis: He had Covid-19 early September 2021.     On 12/02/2019, he had pain between the shoulder blades and pain radiated to the chest.   He went to the [facility] ED, concerned about an MI.   He was evaluated and released.   The next day, 12/03/2019, he had the onset of severe pain in the shoulder blade region again.   Over the next 2 hours the hands became  weaker and he was unable to open a door aor hold items.   He went to the [facility] one ED.  Duringt he wait, he began to have more troublle with his legs.   He had numbness in the abdomen and tingling and mild weakness in his legs.   He felt off balanced.  At his worse, he had numbness form the left shoulder down and from the right upper chest down.     He was admitted and recevied 5 days of IV Solu-Medrol  (1000 mg daily).    He felt some improvement and was walking better, feeding self but still having numbness and mild weaknes in his hands.  Legs were improved though he still needed a cane.   He continued to improve some over the next couple months but not to baseline  He has Type 2 DM and is on Janumet  and insulin  prn.   Sugars were  very elevated during steroid therapy and he had severe insomnia.   He had Covid-19 diagnosed October 05, 2019.    Imaging studies reviewed: MRI of the cervical spine 12/03/2019 showed several T2 hyperintense foci within the spinal cord.  Small foci are noted at C3, C3-C4 and C5-C6 and a larger focus is noted from C5-C6 through T1.  There are degenerative changes.  At C5-C6 there is spinal stenosis and foraminal narrowing but no nerve root compression.  At C6-7, there is mild spinal stenosis and bilateral foraminal narrowing.  Moderate degenerative changes at other levels that do not lead to spinal stenosis or nerve root compression.  MRI of the brain 12/04/2019 was normal.  MRI of the thoracic spine 12/04/2019 was normal.  MRI of the brain and cervical spine 01/24/2021 shows no new lesions compared to the MRIs from 2021  MRI cervical spine 06/07/2022 showed a possible new lesion adjacent to C3 not seen on previous images.  MRi brain 06/07/2022 was normal.   Pertinent laboratory results: CSF 12/05/2019 showed normal protein and glucose and cell count.  VDRL was negative.  No oligoclonal bands were present.  Labs 12/03/2019 through 12/06/2019: HIV is negative.  Arboviral profile was negative.  HSV type I and II are negative.  Hemoglobin A1c 9.0.  Glucoses were elevated generally above 200 while getting steroids.  01/04/2020: Pan ANCA, HTLV 1/2, hep C, CRP, ESR, ANA were negative or unremarkable.  05/22/2020: Anti-AQ4 is negative.  05/23/2021: Anti-MOG (cell-based IFA) is negative.   REVIEW OF SYSTEMS: Constitutional: No fevers, chills, sweats, or change in appetite Eyes: No visual changes, double vision, eye pain Ear, nose and throat: No hearing loss, ear pain, nasal congestion, sore throat Cardiovascular: No chest pain, palpitations Respiratory:  No shortness of breath at rest or with exertion.   No wheezes GastrointestinaI: No nausea, vomiting, diarrhea, abdominal pain, fecal  incontinence Genitourinary:  No dysuria, urinary retention or frequency.  No nocturia. Musculoskeletal:  No neck pain, back pain Integumentary: No rash, pruritus, skin lesions Neurological: as above Psychiatric: No depression at this time.  No anxiety Endocrine: No palpitations, diaphoresis, change in appetite, change in weigh or increased thirst Hematologic/Lymphatic:  No anemia, purpura, petechiae. Allergic/Immunologic: No itchy/runny eyes, nasal congestion, recent allergic reactions, rashes  ALLERGIES: No Known Allergies  HOME MEDICATIONS:  Current Outpatient Medications:    baclofen  (LIORESAL ) 10 MG tablet, One half to one pill po tid (Patient taking differently: daily. One half to one pill po tid), Disp: 90 each, Rfl: 11   irbesartan (AVAPRO) 75 MG tablet, Take 75 mg by  mouth daily., Disp: , Rfl:    meloxicam  (MOBIC ) 15 MG tablet, Take 15 mg by mouth daily., Disp: , Rfl:    NON FORMULARY, Take 0.5 Pieces by mouth at bedtime. CBD Gummy, Disp: , Rfl:    oxyCODONE -acetaminophen  (PERCOCET) 10-325 MG tablet, Take 1 tablet by mouth in the morning and at bedtime., Disp: , Rfl:    SitaGLIPtin -MetFORMIN  HCl [id number] MG TB24, Take 1 tablet by mouth 2 (two) times daily., Disp: , Rfl: 1   tamsulosin (FLOMAX) 0.4 MG CAPS capsule, Take 0.4 mg by mouth daily., Disp: , Rfl:    trolamine salicylate (BLUE-EMU HEMP) 10 % cream, Apply 1 Application topically as needed for muscle pain., Disp: , Rfl:    levETIRAcetam  (KEPPRA ) 750 MG tablet, Take 2 tablets by mouth twice a day. Call [phone] to schedule follow up for ongoing refills., Disp: 360 tablet, Rfl: 3   tiZANidine  (ZANAFLEX ) 4 MG tablet, TAKE 1/2 TO 1 TABLET BY MOUTH THREE TIMES A DAY, Disp: 270 tablet, Rfl: 3  PAST MEDICAL HISTORY: Past Medical History:  Diagnosis Date   Arthritis    knees and shoulder   Chronic pain syndrome    Complication of anesthesia    wakes up during the end of surgery   Diabetes mellitus without complication  (HCC)    type 2   History of kidney stones    Hypertension    Neuromuscular disorder (HCC)    Transverse Myelitis- now has MS, has ataxic gait, hand tremors    PAST SURGICAL HISTORY: Past Surgical History:  Procedure Laterality Date   COLON SURGERY  2018   diverticulitis 15 inches rescected   KNEE ARTHROSCOPY Right    3 times   TOTAL KNEE ARTHROPLASTY Right 01/15/2017   Procedure: RIGHT TOTAL KNEE ARTHROPLASTY;  Surgeon: Orvan Blanch, MD;  Location: [facility];  Service: Orthopedics;  Laterality: Right;  2 hrs    FAMILY HISTORY: Family History  Problem Relation Age of Onset   Heart attack Mother    Pancreatitis Mother    Cancer Father        kidney   Diabetes Father    High blood pressure Father    Multiple sclerosis Neg Hx     SOCIAL HISTORY:  Social History   Socioeconomic History   Marital status: Married    Spouse name: Not on file   Number of children: Not on file   Years of education: Not on file   Highest education level: Not on file  Occupational History   Not on file  Tobacco Use   Smoking status: Never   Smokeless tobacco: Never  Vaping Use   Vaping status: Never Used  Substance and Sexual Activity   Alcohol use: Yes    Comment: occ beer   Drug use: No   Sexual activity: Yes  Other Topics Concern   Not on file  Social History Narrative   Right handed   Caffeine use: Diet MT Dew, Diet drinks   Lives with wife   Pt works    Social Drivers of Corporate investment banker Strain: Not on file  Food Insecurity: Not on file  Transportation Needs: Not on file  Physical Activity: Not on file  Stress: Not on file  Social Connections: Not on file  Intimate Partner Violence: Not on file     PHYSICAL EXAM  Vitals:   06/04/23 0846  BP: (!) 142/95  Pulse: 78  Weight: 256 lb (116.1 kg)  Height: 5\' 9"  (1.753 m)  Body mass index is 37.8 kg/m.   General: The patient is well-developed and well-nourished and in no acute distress  HEENT:   Head is [place]/AT.  Sclera are anicteric.    Skin/musculoskeletal: Extremities are without rash or  edema.  He is tender over lower neck and shoulder muscles on the left.  Normal range of motion in left shoulder though movements are mildly tender.  No tenderness over the subacromial bursa  Neurologic Exam  Mental status: The patient is alert and oriented x 3 at the time of the examination. The patient has apparent normal recent and remote memory, with an apparently normal attention span and concentration ability.   Speech is normal.  Cranial nerves: Extraocular movements are full.  Facial strength and sensation was normal.  No dysarthria is noted.  The tongue is midline, and the patient has symmetric elevation of the soft palate. No obvious hearing deficits are noted.  Motor:  Muscle bulk is normal.   Tone is normal though he has mild flexion contracture of the fourth and fifth fingers on the left, similar to last visit.  Strength is  5 / 5 in proximal arms and now 5/5 in the triceps.  Strength is 4-/5 in the intrinsic hand muscles on the left and 4+ on right.   Leg strength was 5/5.    Sensory: Sensory testing is intact to vibration sensation in arms or legs.  He has reduced temperature sensation in the left arm versus right arm.  Coordination: Cerebellar testing reveals good finger-nose-finger and heel-to-shin bilaterally.  Gait and station: Station is normal.  Gait is normal.  Tandem gait is mildly wide.  Romberg is negative.   Reflexes: Deep tendon reflexes are symmetric and normal bilaterally.        DIAGNOSTIC DATA (LABS, IMAGING, TESTING) - I reviewed patient records, labs, notes, testing and imaging myself where available.  Lab Results  Component Value Date   WBC 11.6 (H) 12/29/2022   HGB 15.0 12/29/2022   HCT 45.1 12/29/2022   MCV 92.0 12/29/2022   PLT 272 12/29/2022      Component Value Date/Time   NA 135 12/29/2022 0955   K 4.6 12/29/2022 0955   CL 102 12/29/2022 0955    CO2 24 12/29/2022 0955   GLUCOSE 202 (H) 12/29/2022 0955   BUN 20 12/29/2022 0955   CREATININE 1.13 12/29/2022 0955   CALCIUM 9.6 12/29/2022 0955   PROT 7.6 12/29/2022 0955   PROT 7.3 06/12/2022 1127   ALBUMIN 4.2 12/29/2022 0955   ALBUMIN 4.5 06/12/2022 1127   AST 40 12/29/2022 0955   ALT 62 (H) 12/29/2022 0955   ALKPHOS 74 12/29/2022 0955   BILITOT 0.7 12/29/2022 0955   BILITOT 0.9 06/12/2022 1127   GFRNONAA >60 12/29/2022 0955   GFRAA >60 01/16/2017 0536   No results found for: "CHOL", "HDL", "LDLCALC", "LDLDIRECT", "TRIG", "CHOLHDL" Lab Results  Component Value Date   HGBA1C 8.3 (H) 12/29/2022   No results found for: "VITAMINB12" Lab Results  Component Value Date   TSH 0.264 (L) 12/05/2019       ASSESSMENT AND PLAN  Multiple sclerosis (HCC) - Plan: IgG, IgA, IgM, CBC with Differential/Platelet  High risk medication use - Plan: IgG, IgA, IgM, CBC with Differential/Platelet  Spinal cord anomaly (HCC)    1.  We had a discussion about his diagnosis --relapsing MS versus spinal cord infarct.  For now, continue Briumvi.  Later this year we will recheck an MRI and give some thought of discontinuing  based on findings.  I also will have him start taking baby aspirin . 2.   stay active and exercise as tolerated. 3.   To help the phasic spasms, continue tizanidine  and continue levetiracetam .  If spasms worsen, we will change tizanidine  to clonazepam.  We also discussed that if none of the pills help much we could consider Botox 4.    Return in 5 months or sooner if there are new or worsening neurologic symptoms.       45-minute office visit with the majority of the time spent face-to-face for history and physical, discussion/counseling and decision-making.  Additional time with record review and documentation.   Shauni Henner A. Godwin Lat, MD, [facility] 06/04/2023, 12:03 PM Certified in Neurology, Clinical Neurophysiology, Sleep Medicine and Neuroimaging  [facility] [address] [place], [place] [address] [phone]

## 2023-06-05 LAB — CBC WITH DIFFERENTIAL/PLATELET
Basophils Absolute: 0.1 10*3/uL (ref 0.0–0.2)
Basos: 1 %
EOS (ABSOLUTE): 0.1 10*3/uL (ref 0.0–0.4)
Eos: 1 %
Hematocrit: 50.1 % (ref 37.5–51.0)
Hemoglobin: 16.7 g/dL (ref 13.0–17.7)
Immature Grans (Abs): 0.1 10*3/uL (ref 0.0–0.1)
Immature Granulocytes: 1 %
Lymphocytes Absolute: 2.9 10*3/uL (ref 0.7–3.1)
Lymphs: 23 %
MCH: 31.3 pg (ref 26.6–33.0)
MCHC: 33.3 g/dL (ref 31.5–35.7)
MCV: 94 fL (ref 79–97)
Monocytes Absolute: 1.2 10*3/uL — ABNORMAL HIGH (ref 0.1–0.9)
Monocytes: 9 %
Neutrophils Absolute: 8.3 10*3/uL — ABNORMAL HIGH (ref 1.4–7.0)
Neutrophils: 65 %
Platelets: 275 10*3/uL (ref 150–450)
RBC: 5.33 x10E6/uL (ref 4.14–5.80)
RDW: 12.3 % (ref 11.6–15.4)
WBC: 12.5 10*3/uL — ABNORMAL HIGH (ref 3.4–10.8)

## 2023-06-05 LAB — IGG, IGA, IGM
IgA/Immunoglobulin A, Serum: 310 mg/dL (ref 90–386)
IgG (Immunoglobin G), Serum: 1027 mg/dL (ref 603–1613)
IgM (Immunoglobulin M), Srm: 108 mg/dL (ref 20–172)

## 2023-06-08 ENCOUNTER — Telehealth: Payer: Self-pay | Admitting: *Deleted

## 2023-06-08 NOTE — Telephone Encounter (Signed)
-----   Message from Jorie Newness sent at 06/05/2023  1:30 PM EDT ----- The lab work looks okay.  The white blood cells/neutrophils were mildly elevated but not enough to be concerned about.

## 2023-06-08 NOTE — Telephone Encounter (Signed)
 Spoke to patient gave labwork results reminded him of upcoming appointment in October Pt expressed understanding and thanked me for calling

## 2023-06-15 DIAGNOSIS — Z131 Encounter for screening for diabetes mellitus: Secondary | ICD-10-CM | POA: Diagnosis not present

## 2023-06-15 DIAGNOSIS — Z125 Encounter for screening for malignant neoplasm of prostate: Secondary | ICD-10-CM | POA: Diagnosis not present

## 2023-06-15 DIAGNOSIS — Z13220 Encounter for screening for lipoid disorders: Secondary | ICD-10-CM | POA: Diagnosis not present

## 2023-06-15 DIAGNOSIS — Z0000 Encounter for general adult medical examination without abnormal findings: Secondary | ICD-10-CM | POA: Diagnosis not present

## 2023-06-26 DIAGNOSIS — Z6838 Body mass index (BMI) 38.0-38.9, adult: Secondary | ICD-10-CM | POA: Diagnosis not present

## 2023-06-26 DIAGNOSIS — E1129 Type 2 diabetes mellitus with other diabetic kidney complication: Secondary | ICD-10-CM | POA: Diagnosis not present

## 2023-07-02 DIAGNOSIS — G35 Multiple sclerosis: Secondary | ICD-10-CM | POA: Diagnosis not present

## 2023-07-07 ENCOUNTER — Ambulatory Visit: Admitting: Neurology

## 2023-08-11 DIAGNOSIS — M25511 Pain in right shoulder: Secondary | ICD-10-CM | POA: Diagnosis not present

## 2023-08-11 DIAGNOSIS — Z79899 Other long term (current) drug therapy: Secondary | ICD-10-CM | POA: Diagnosis not present

## 2023-08-11 DIAGNOSIS — M25512 Pain in left shoulder: Secondary | ICD-10-CM | POA: Diagnosis not present

## 2023-08-31 ENCOUNTER — Telehealth: Payer: Self-pay | Admitting: *Deleted

## 2023-08-31 NOTE — Telephone Encounter (Signed)
 Faxed completed/signed form below to [facility]/Dr. Kay at [phone]. Received fax confirmation.

## 2023-10-27 DIAGNOSIS — Z23 Encounter for immunization: Secondary | ICD-10-CM | POA: Diagnosis not present

## 2023-10-27 DIAGNOSIS — Z6837 Body mass index (BMI) 37.0-37.9, adult: Secondary | ICD-10-CM | POA: Diagnosis not present

## 2023-10-27 DIAGNOSIS — E1129 Type 2 diabetes mellitus with other diabetic kidney complication: Secondary | ICD-10-CM | POA: Diagnosis not present

## 2023-11-09 ENCOUNTER — Ambulatory Visit: Admitting: Neurology

## 2023-11-09 ENCOUNTER — Encounter: Payer: Self-pay | Admitting: Neurology

## 2023-11-09 VITALS — BP 139/95 | Ht 69.0 in | Wt 254.5 lb

## 2023-11-09 DIAGNOSIS — R252 Cramp and spasm: Secondary | ICD-10-CM

## 2023-11-09 DIAGNOSIS — Q069 Congenital malformation of spinal cord, unspecified: Secondary | ICD-10-CM | POA: Diagnosis not present

## 2023-11-09 DIAGNOSIS — R29898 Other symptoms and signs involving the musculoskeletal system: Secondary | ICD-10-CM

## 2023-11-09 DIAGNOSIS — G35A Relapsing-remitting multiple sclerosis: Secondary | ICD-10-CM | POA: Diagnosis not present

## 2023-11-09 DIAGNOSIS — Z79899 Other long term (current) drug therapy: Secondary | ICD-10-CM | POA: Diagnosis not present

## 2023-11-09 DIAGNOSIS — R260 Ataxic gait: Secondary | ICD-10-CM

## 2023-11-09 DIAGNOSIS — G35D Multiple sclerosis, unspecified: Secondary | ICD-10-CM | POA: Diagnosis not present

## 2023-11-09 NOTE — Progress Notes (Addendum)
 [facility]  PATIENT: Ivan Jones DOB: 04/27/1975  REFERRING DOCTOR OR PCP: Dr. Ina SOURCE: Patient, notes from hospitalization, imaging and lab reports, MRI images personally reviewed.  _________________________________   HISTORICAL  CHIEF COMPLAINT:  Chief Complaint  Patient presents with   Follow-up    Pt in room 10. Alone. Here for MS follow up.    HISTORY OF PRESENT ILLNESS:  Ivan Jones is a 48 y.o. man with longitudinally extensive transverse myelitis late October 2021, RRMS  Update 11/09/2023: He reports that his left hand is a little more clumsy.  Right hand is fine.    Phasic spasms are better with levetiracetam  and tizanidine .     He feels the right knee is a little weaker.   He has had a TKR about 10 years ago.    He has a complicated presentation and I discussed with him that I am not certain of the diagnosis of relapsing remitting MS as I would prefer that the.  In 2021, he presented with a spinal cord syndrome and MRI was consistent with a longitudinally extensive transverse myelitis in 2021.  Cervical follow-up MRIs did not show any new foci and the edema was greatly resolved.  However, A follow-up MRI in 2024 appeared to show a new focus adjacent to C3.  He had several other discrete foci in the cervical spine.  At the time, I felt that this additional focus meets criteria for multiple sclerosis given the other findings.  He was started on Briumvi.  CSF had been normal (no oligoclonal bands).  Since then, I reevaluated his imaging studies.  Although the focus at C3 did not appear to be on an MRI from 2022, signal abnormality does appear to be on the initial MRI and the first follow-up MRI.  Therefore, this may not be a new focus.    He has had anti-NMO and anti-MOG checked twice and they have been negative.  Looking at the MRIs, once the edema resolved, the old lesions show T2 hyperintense changes in the anterior horn and this  pattern could also be seen with spinal cord infarction.  He does have risk factor of diabetes and hypertension.  Symptomatically, he is stable compared to the last.  He has phasic spasms in the left arm occurring several times a week on average.  Fortunately, he is right-handed.  He notes pain in the left posterior shoulder and the left hand due to the cramping.  Repetitive fine motor tasks and stress increase the likelihood of spasms.  When 1 occurs, there is posturing of the left hand lasting 30 to 60 seconds.  He continues to have some left hand weakness.  We have tried several different medications.    Levetitacetam has helped some and now at 2250/day.  He has not tried a benzo.  Baclofen  had no benefit.   Gabapentin /pregabalin  have not helped.  Besides the neurologic issues, he will be having a right shoulder replacement in December 2025 (needs to get DM under better control)  Initially, in October 2021 with symptoms again he had difficulties with his gait.  He improved quite a bit.  His gait is generally fine though his legs get wobbly if he walks longer distance.   He walks up to 8000/steps a day though.  He has not needed his cane this year.  He still notes some weakness in the right leg at times.  He notes this more with longer distance. He has altered sensation in his  trunk and legs.       Bladder function is fine.       Cognition is doing well.  History of transverse myelitis: He had Covid-19 early September 2021.     On 12/02/2019, he had pain between the shoulder blades and pain radiated to the chest.   He went to the [facility] ED, concerned about an MI.   He was evaluated and released.   The next day, 12/03/2019, he had the onset of severe pain in the shoulder blade region again.   Over the next 2 hours the hands became weaker and he was unable to open a door aor hold items.   He went to the [facility] one ED.  Duringt he wait, he began to have more troublle with his legs.   He had numbness in the  abdomen and tingling and mild weakness in his legs.   He felt off balanced.  At his worse, he had numbness form the left shoulder down and from the right upper chest down.     He was admitted and recevied 5 days of IV Solu-Medrol  (1000 mg daily).    He felt some improvement and was walking better, feeding self but still having numbness and mild weaknes in his hands.  Legs were improved though he still needed a cane.   He continued to improve some over the next couple months but not to baseline  He has Type 2 DM and is on Janumet  and insulin  prn.   Sugars were very elevated during steroid therapy and he had severe insomnia.   He had Covid-19 diagnosed October 05, 2019.    Imaging studies reviewed: MRI of the cervical spine 12/03/2019 showed several T2 hyperintense foci within the spinal cord.  Small foci are noted at C3, C3-C4 and C5-C6 and a larger focus is noted from C5-C6 through T1.  There are degenerative changes.  At C5-C6 there is spinal stenosis and foraminal narrowing but no nerve root compression.  At C6-7, there is mild spinal stenosis and bilateral foraminal narrowing.  Moderate degenerative changes at other levels that do not lead to spinal stenosis or nerve root compression.  MRI of the brain 12/04/2019 was normal.  MRI of the thoracic spine 12/04/2019 was normal.  MRI of the brain and cervical spine 01/24/2021 shows no new lesions compared to the MRIs from 2021  MRI cervical spine 06/07/2022 showed a possible new lesion adjacent to C3 not seen on previous images.  MRi brain 06/07/2022 was normal.  --However in retrospect, though this focus is not seen on the 2022 MRI, it was seen on the MRI from 2021.  Pertinent laboratory results: CSF 12/05/2019 showed normal protein and glucose and cell count.  VDRL was negative.  No oligoclonal bands were present.  Labs 12/03/2019 through 12/06/2019: HIV is negative.  Arboviral profile was negative.  HSV type I and II are negative.  Hemoglobin A1c 9.0.   Glucoses were elevated generally above 200 while getting steroids.  01/04/2020: Pan ANCA, HTLV 1/2, hep C, CRP, ESR, ANA were negative or unremarkable.  05/22/2020: Anti-AQ4 is negative.  05/23/2021: Anti-MOG (cell-based IFA) is negative.   REVIEW OF SYSTEMS: Constitutional: No fevers, chills, sweats, or change in appetite Eyes: No visual changes, double vision, eye pain Ear, nose and throat: No hearing loss, ear pain, nasal congestion, sore throat Cardiovascular: No chest pain, palpitations Respiratory:  No shortness of breath at rest or with exertion.   No wheezes GastrointestinaI: No nausea, vomiting, diarrhea, abdominal  pain, fecal incontinence Genitourinary:  No dysuria, urinary retention or frequency.  No nocturia. Musculoskeletal:  No neck pain, back pain Integumentary: No rash, pruritus, skin lesions Neurological: as above Psychiatric: No depression at this time.  No anxiety Endocrine: No palpitations, diaphoresis, change in appetite, change in weigh or increased thirst Hematologic/Lymphatic:  No anemia, purpura, petechiae. Allergic/Immunologic: No itchy/runny eyes, nasal congestion, recent allergic reactions, rashes  ALLERGIES: No Known Allergies  HOME MEDICATIONS:  Current Outpatient Medications:    baclofen  (LIORESAL ) 10 MG tablet, One half to one pill po tid (Patient taking differently: daily. One half to one pill po tid), Disp: 90 each, Rfl: 11   irbesartan (AVAPRO) 75 MG tablet, Take 75 mg by mouth daily., Disp: , Rfl:    levETIRAcetam  (KEPPRA ) 750 MG tablet, Take 2 tablets by mouth twice a day. Call [phone] to schedule follow up for ongoing refills., Disp: 360 tablet, Rfl: 3   meloxicam  (MOBIC ) 15 MG tablet, Take 15 mg by mouth daily., Disp: , Rfl:    NON FORMULARY, Take 0.5 Pieces by mouth at bedtime. CBD Gummy, Disp: , Rfl:    oxyCODONE -acetaminophen  (PERCOCET) 10-325 MG tablet, Take 1 tablet by mouth in the morning and at bedtime., Disp: , Rfl:     SitaGLIPtin -MetFORMIN  HCl [phone] MG TB24, Take 1 tablet by mouth 2 (two) times daily., Disp: , Rfl: 1   tamsulosin (FLOMAX) 0.4 MG CAPS capsule, Take 0.4 mg by mouth daily., Disp: , Rfl:    tiZANidine  (ZANAFLEX ) 4 MG tablet, TAKE 1/2 TO 1 TABLET BY MOUTH THREE TIMES A DAY, Disp: 270 tablet, Rfl: 3   trolamine salicylate (BLUE-EMU HEMP) 10 % cream, Apply 1 Application topically as needed for muscle pain., Disp: , Rfl:    ALPRAZolam  (XANAX ) 1 MG tablet, Take one or two before MRi, Disp: 2 tablet, Rfl: 0  PAST MEDICAL HISTORY: Past Medical History:  Diagnosis Date   Arthritis    knees and shoulder   Chronic pain syndrome    Complication of anesthesia    wakes up during the end of surgery   Diabetes mellitus without complication (HCC)    type 2   History of kidney stones    Hypertension    Neuromuscular disorder (HCC)    Transverse Myelitis- now has MS, has ataxic gait, hand tremors    PAST SURGICAL HISTORY: Past Surgical History:  Procedure Laterality Date   COLON SURGERY  2018   diverticulitis 15 inches rescected   KNEE ARTHROSCOPY Right    3 times   TOTAL KNEE ARTHROPLASTY Right 01/15/2017   Procedure: RIGHT TOTAL KNEE ARTHROPLASTY;  Surgeon: Duwayne Purchase, MD;  Location: [facility];  Service: Orthopedics;  Laterality: Right;  2 hrs    FAMILY HISTORY: Family History  Problem Relation Age of Onset   Heart attack Mother    Pancreatitis Mother    Cancer Father        kidney   Diabetes Father    High blood pressure Father    Multiple sclerosis Neg Hx     SOCIAL HISTORY:  Social History   Socioeconomic History   Marital status: Married    Spouse name: Not on file   Number of children: Not on file   Years of education: Not on file   Highest education level: Not on file  Occupational History   Not on file  Tobacco Use   Smoking status: Never   Smokeless tobacco: Never  Vaping Use   Vaping status: Never Used  Substance and  Sexual Activity   Alcohol use: Not  Currently    Comment: occ beer   Drug use: No   Sexual activity: Yes  Other Topics Concern   Not on file  Social History Narrative   Right handed   Caffeine use: Diet MT Dew, Diet drinks   Lives with wife   Pt works    Social Drivers of Corporate investment banker Strain: Not on file  Food Insecurity: Not on file  Transportation Needs: Not on file  Physical Activity: Not on file  Stress: Not on file  Social Connections: Not on file  Intimate Partner Violence: Not on file     PHYSICAL EXAM  Vitals:   11/09/23 1244  BP: (!) 139/95  Weight: 254 lb 8 oz (115.4 kg)  Height: 5' 9 (1.753 m)     Body mass index is 37.58 kg/m.   General: The patient is well-developed and well-nourished and in no acute distress  HEENT:  Head is [place]/AT.  Sclera are anicteric.    Skin/musculoskeletal: Extremities are without rash or  edema.  He is tender over lower neck and shoulder muscles on the left.  Normal range of motion in left shoulder though movements are mildly tender.  No tenderness over the subacromial bursa  Neurologic Exam  Mental status: The patient is alert and oriented x 3 at the time of the examination. The patient has apparent normal recent and remote memory, with an apparently normal attention span and concentration ability.   Speech is normal.  Cranial nerves: Extraocular movements are full.  Facial strength and sensation was normal.  No dysarthria is noted.  The tongue is midline, and the patient has symmetric elevation of the soft palate. No obvious hearing deficits are noted.  Motor:  Muscle bulk is normal.   Tone is normal though he has mild flexion contracture of the fourth and fifth fingers on the left, similar to last visit.  Strength is  5 / 5 in proximal arms and now 5/5 in the triceps.  Strength is 4-/5 in the intrinsic hand muscles on the left and 4+ on right.   Leg strength was 5/5.    Sensory: Sensory testing is intact to vibration sensation in arms or legs.   He has reduced temperature sensation in the left arm versus right arm.  Coordination: Cerebellar testing reveals good finger-nose-finger and heel-to-shin bilaterally.  Gait and station: Station is normal.  Gait is normal.  Tandem gait is mildly wide.  Romberg is negative (mild wobbling)   Reflexes: Deep tendon reflexes are symmetric and normal bilaterally.        DIAGNOSTIC DATA (LABS, IMAGING, TESTING) - I reviewed patient records, labs, notes, testing and imaging myself where available.  Lab Results  Component Value Date   WBC 12.5 (H) 06/04/2023   HGB 16.7 06/04/2023   HCT 50.1 06/04/2023   MCV 94 06/04/2023   PLT 275 06/04/2023      Component Value Date/Time   NA 135 12/29/2022 0955   K 4.6 12/29/2022 0955   CL 102 12/29/2022 0955   CO2 24 12/29/2022 0955   GLUCOSE 202 (H) 12/29/2022 0955   BUN 20 12/29/2022 0955   CREATININE 1.13 12/29/2022 0955   CALCIUM 9.6 12/29/2022 0955   PROT 7.6 12/29/2022 0955   PROT 7.3 06/12/2022 1127   ALBUMIN 4.2 12/29/2022 0955   ALBUMIN 4.5 06/12/2022 1127   AST 40 12/29/2022 0955   ALT 62 (H) 12/29/2022 0955   ALKPHOS 74  12/29/2022 0955   BILITOT 0.7 12/29/2022 0955   BILITOT 0.9 06/12/2022 1127   GFRNONAA >60 12/29/2022 0955   GFRAA >60 01/16/2017 0536   No results found for: CHOL, HDL, LDLCALC, LDLDIRECT, TRIG, CHOLHDL Lab Results  Component Value Date   HGBA1C 8.3 (H) 12/29/2022   No results found for: VITAMINB12 Lab Results  Component Value Date   TSH 0.264 (L) 12/05/2019       ASSESSMENT AND PLAN  Multiple sclerosis, relapsing-remitting  Multiple sclerosis - Plan: MR BRAIN W WO CONTRAST, MR CERVICAL SPINE W WO CONTRAST, Ambulatory referral to Neurology  Left hand weakness - Plan: MR BRAIN W WO CONTRAST, MR CERVICAL SPINE W WO CONTRAST  Spinal cord anomaly (HCC)  High risk medication use  Spasms of the hands or feet  Ataxic gait    1.  We had a discussion about his diagnosis --(1)  relapsing MS versus (2) double negative NMOSD versus (3) spinal cord infarct.  For now, continue Briumvi.  Also continues to take baby aspirin  and keep the vascular comorbidities under the best control.  We will recheck an MRI of the brain and cervical spine.  Additionally, I would like him to get a second opinion as he does not neatly fit any of the 3 diagnoses I have most considered.  I will send in a referral to [facility]. 2.   stay active and exercise as tolerated. 3.   To help the phasic spasms, continue tizanidine  and continue levetiracetam .   4.    Return in 6 months or sooner if there are new or worsening neurologic symptoms.       40-minute office visit with the majority of the time spent face-to-face for history and physical, discussion/counseling and decision-making.  Additional time with record review and documentation.   Tessah Patchen A. Vear, MD, [facility] 11/18/2023, 9:51 AM Certified in Neurology, Clinical Neurophysiology, Sleep Medicine and Neuroimaging  [facility] [address], [address] [place], [place] [address] [phone]

## 2023-11-10 ENCOUNTER — Telehealth: Payer: Self-pay | Admitting: Neurology

## 2023-11-10 ENCOUNTER — Other Ambulatory Visit: Payer: Self-pay | Admitting: Neurology

## 2023-11-10 MED ORDER — ALPRAZOLAM 1 MG PO TABS: ORAL_TABLET | ORAL | 0 refills | Status: AC

## 2023-11-10 NOTE — Telephone Encounter (Signed)
 He is schedule for the MRIs on 10/15 and asked for Xanax  to take before please

## 2023-11-10 NOTE — Telephone Encounter (Signed)
 Dr. Vear- can you place order for Xanax ? Thank you

## 2023-11-12 ENCOUNTER — Telehealth: Payer: Self-pay | Admitting: Neurology

## 2023-11-12 NOTE — Telephone Encounter (Signed)
 Referral to Neurology faxed to [facility]   [facility]  Phone: [phone] Fax: [phone]

## 2023-11-18 ENCOUNTER — Encounter: Payer: Self-pay | Admitting: Neurology

## 2023-11-18 ENCOUNTER — Ambulatory Visit

## 2023-11-18 DIAGNOSIS — R29898 Other symptoms and signs involving the musculoskeletal system: Secondary | ICD-10-CM | POA: Diagnosis not present

## 2023-11-18 DIAGNOSIS — G35A Relapsing-remitting multiple sclerosis: Secondary | ICD-10-CM | POA: Insufficient documentation

## 2023-11-18 DIAGNOSIS — G35D Multiple sclerosis, unspecified: Secondary | ICD-10-CM

## 2023-11-18 MED ORDER — GADOBENATE DIMEGLUMINE 529 MG/ML IV SOLN
20.0000 mL | Freq: Once | INTRAVENOUS | Status: AC | PRN
  Administered 2023-11-18: 20 mL via INTRAVENOUS

## 2023-11-19 ENCOUNTER — Ambulatory Visit: Payer: Self-pay | Admitting: Neurology

## 2023-11-19 NOTE — Telephone Encounter (Signed)
 Called and LVM relaying results per Dr. Sallye note. Asked him call back if he has any questions or trouble getting scheduled with [facility].

## 2023-11-19 NOTE — Telephone Encounter (Signed)
-----   Message from Charlie DELENA Crete sent at 11/19/2023  4:45 PM EDT ----- Please let him know that the MRI of the cervical spine and the MRI of the brain look the same as last year.  I still would like him to get the second opinion at [facility] to get additional input ----- Message ----- From: Crete Charlie DELENA, MD Sent: 11/18/2023   6:33 PM EDT To: Charlie DELENA Crete, MD

## 2023-12-08 DIAGNOSIS — G35D Multiple sclerosis, unspecified: Secondary | ICD-10-CM | POA: Diagnosis not present

## 2023-12-08 DIAGNOSIS — G8929 Other chronic pain: Secondary | ICD-10-CM | POA: Diagnosis not present

## 2023-12-14 DIAGNOSIS — R29818 Other symptoms and signs involving the nervous system: Secondary | ICD-10-CM | POA: Diagnosis not present

## 2023-12-14 DIAGNOSIS — R29898 Other symptoms and signs involving the musculoskeletal system: Secondary | ICD-10-CM | POA: Diagnosis not present

## 2023-12-14 DIAGNOSIS — G379 Demyelinating disease of central nervous system, unspecified: Secondary | ICD-10-CM | POA: Diagnosis not present

## 2023-12-14 DIAGNOSIS — R2681 Unsteadiness on feet: Secondary | ICD-10-CM | POA: Diagnosis not present

## 2023-12-14 DIAGNOSIS — G890 Central pain syndrome: Secondary | ICD-10-CM | POA: Diagnosis not present

## 2023-12-15 ENCOUNTER — Telehealth: Payer: Self-pay | Admitting: Neurology

## 2023-12-15 NOTE — Telephone Encounter (Signed)
 G35.A

## 2023-12-15 NOTE — Telephone Encounter (Signed)
 Stacy with [facility] called to speak to Nurse about Pt medication  need updates Glade states  she need  missing ICD  10 coded on medication order for Pt. As of 10-1- 25  all  ICD 10  has changes for MS ,  [phone]

## 2023-12-15 NOTE — Telephone Encounter (Signed)
 Spoke w/ Stacy/[facility]. She will fax form for us  to complete today and we will fax back.

## 2023-12-17 DIAGNOSIS — G35A Relapsing-remitting multiple sclerosis: Secondary | ICD-10-CM | POA: Diagnosis not present

## 2023-12-21 NOTE — Progress Notes (Signed)
 Sent message, via epic in basket, requesting orders in epic from [facility].

## 2023-12-22 NOTE — H&P (Signed)
 Patient's anticipated LOS is less than 2 midnights, meeting these requirements: - Younger than 8 - Lives within 1 hour of care - Has a competent adult at home to recover with post-op recover - NO history of  - Chronic pain requiring opiods  - Diabetes  - Coronary Artery Disease  - Heart failure  - Heart attack  - Stroke  - DVT/VTE  - Cardiac arrhythmia  - Respiratory Failure/COPD  - Renal failure  - Anemia  - Advanced Liver disease     Ivan Jones is an 48 y.o. male.    Chief Complaint: right shoulder pain  HPI: Pt is a 48 y.o. male complaining of right shoulder pain for multiple years. Pain had continually increased since the beginning. X-rays in the clinic show end-stage arthritic changes of the right shoulder. Pt has tried various conservative treatments which have failed to alleviate their symptoms, including injections and therapy. Various options are discussed with the patient. Risks, benefits and expectations were discussed with the patient. Patient understand the risks, benefits and expectations and wishes to proceed with surgery.   PCP:  Ina Marcellus RAMAN, MD  D/C Plans: Home  PMH: Past Medical History:  Diagnosis Date   Arthritis    knees and shoulder   Chronic pain syndrome    Complication of anesthesia    wakes up during the end of surgery   Diabetes mellitus without complication (HCC)    type 2   History of kidney stones    Hypertension    Neuromuscular disorder (HCC)    Transverse Myelitis- now has MS, has ataxic gait, hand tremors    PSH: Past Surgical History:  Procedure Laterality Date   COLON SURGERY  2018   diverticulitis 15 inches rescected   KNEE ARTHROSCOPY Right    3 times   TOTAL KNEE ARTHROPLASTY Right 01/15/2017   Procedure: RIGHT TOTAL KNEE ARTHROPLASTY;  Surgeon: Duwayne Purchase, MD;  Location: [facility];  Service: Orthopedics;  Laterality: Right;  2 hrs    Social History:  reports that he has never smoked. He has never used  smokeless tobacco. He reports that he does not currently use alcohol. He reports that he does not use drugs. BMI: Estimated body mass index is 37.58 kg/m as calculated from the following:   Height as of 11/09/23: 5' 9 (1.753 m).   Weight as of 11/09/23: 115.4 kg.    Diabetes:   Patient has a diagnosis of diabetes,  Lab Results  Component Value Date   HGBA1C 8.3 (H) 12/29/2022   Smoking Status:   reports that he has never smoked. He has never used smokeless tobacco.    Allergies:  No Known Allergies  Medications: No current facility-administered medications for this encounter.   Current Outpatient Medications  Medication Sig Dispense Refill   ALPRAZolam  (XANAX ) 1 MG tablet Take one or two before MRi 2 tablet 0   baclofen  (LIORESAL ) 10 MG tablet One half to one pill po tid (Patient taking differently: daily. One half to one pill po tid) 90 each 11   irbesartan (AVAPRO) 75 MG tablet Take 75 mg by mouth daily.     levETIRAcetam  (KEPPRA ) 750 MG tablet Take 2 tablets by mouth twice a day. Call [phone] to schedule follow up for ongoing refills. 360 tablet 3   meloxicam  (MOBIC ) 15 MG tablet Take 15 mg by mouth daily.     NON FORMULARY Take 0.5 Pieces by mouth at bedtime. CBD Gummy     oxyCODONE -acetaminophen  (PERCOCET)  10-325 MG tablet Take 1 tablet by mouth in the morning and at bedtime.     SitaGLIPtin -MetFORMIN  HCl [phone] MG TB24 Take 1 tablet by mouth 2 (two) times daily.  1   tamsulosin (FLOMAX) 0.4 MG CAPS capsule Take 0.4 mg by mouth daily.     tiZANidine  (ZANAFLEX ) 4 MG tablet TAKE 1/2 TO 1 TABLET BY MOUTH THREE TIMES A DAY 270 tablet 3   trolamine salicylate (BLUE-EMU HEMP) 10 % cream Apply 1 Application topically as needed for muscle pain.      No results found for this or any previous visit (from the past 48 hours). No results found.  ROS: Pain with rom of the right upper extremity  Physical Exam: Alert and oriented 48 y.o. male in no acute distress Cranial  nerves 2-12 intact Cervical spine: full rom with no tenderness, nv intact distally Chest: active breath sounds bilaterally, no wheeze rhonchi or rales Heart: regular rate and rhythm, no murmur Abd: non tender non distended with active bowel sounds Hip is stable with rom  Right shoulder painful rom Strength 5/5 with ER and IR Crepitus with rom  Assessment/Plan Assessment: right shoulder end stage osteoarthritis  Plan:  Patient will undergo a right total shoulder by Dr. Kay at [place] Risks benefits and expectations were discussed with the patient. Patient understand risks, benefits and expectations and wishes to proceed. Preoperative templating of the joint replacement has been completed, documented, and submitted to the Operating Room personnel in order to optimize intra-operative equipment management.   Arvella Fireman PA-C, MPAS [facility] is now [employer] [address], [place], [place] [address] Phone: [phone] [email or web address] Facebook  [employer]

## 2023-12-25 NOTE — Progress Notes (Signed)
 Date of COVID positive in last 90 days:  PCP - Marcellus Baptist, MD Cardiologist -  Neurologist-Fletcher True, MD  Clearance in media tab dated 10/02/23  Chest x-ray - N/A EKG - N/A Stress Test - N/A ECHO - N/A Cardiac Cath - N/A Pacemaker/ICD device last checked:N/A Spinal Cord Stimulator:N/A  Bowel Prep - N/A  Sleep Study - N/A CPAP -   Fasting Blood Sugar - N/A Checks Blood Sugar _____ times a day  Last dose of GLP1 agonist-  N/A GLP1 instructions:  Do not take after     Last dose of SGLT-2 inhibitors-  N/A SGLT-2 instructions:  Do not take after     Blood Thinner Instructions: N/A Last dose:   Time: Aspirin  Instructions: ASA 81 Last Dose:  Activity level:  Can go up a flight of stairs and perform activities of daily living without stopping and without symptoms of chest pain or shortness of breath.  Able to exercise without symptoms  Unable to go up a flight of stairs without symptoms of     Anesthesia review: N/A  Patient denies shortness of breath, fever, cough and chest pain at PAT appointment  Patient verbalized understanding of instructions that were given to them at the PAT appointment. Patient was also instructed that they will need to review over the PAT instructions again at home before surgery.

## 2023-12-25 NOTE — Patient Instructions (Signed)
 SURGICAL WAITING ROOM VISITATION  Patients having surgery or a procedure may have no more than 2 support people in the waiting area - these visitors may rotate.    Children under the age of 86 must have an adult with them who is not the patient.  Visitors with respiratory illnesses are discouraged from visiting and should remain at home.  If the patient needs to stay at the hospital during part of their recovery, the visitor guidelines for inpatient rooms apply. Pre-op nurse will coordinate an appropriate time for 1 support person to accompany patient in pre-op.  This support person may not rotate.    Please refer to the [facility] website for the visitor guidelines for Inpatients (after your surgery is over and you are in a regular room).    Your procedure is scheduled on: 01/08/24   Report to [facility] Main Entrance    Report to admitting at 5:15 AM   Call this number if you have problems the morning of surgery [phone]   Do not eat food :After Midnight.   After Midnight you may have the following liquids until 4:30 AM DAY OF SURGERY  Water Non-Citrus Juices (without pulp, NO RED-Apple, White grape, White cranberry) Black Coffee (NO MILK/CREAM OR CREAMERS, sugar ok)  Clear Tea (NO MILK/CREAM OR CREAMERS, sugar ok) regular and decaf                             Plain Jell-O (NO RED)                                           Fruit ices (not with fruit pulp, NO RED)                                     Popsicles (NO RED)                                                               Sports drinks like Gatorade (NO RED)     The day of surgery:  Drink ONE (1) Pre-Surgery G2 at 4:30 AM the morning of surgery. Drink in one sitting. Do not sip.  This drink was given to you during your hospital  pre-op appointment visit. Nothing else to drink after completing the  Pre-Surgery G2.          If you have questions, please contact your surgeon's office.   FOLLOW BOWEL PREP  AND ANY ADDITIONAL PRE OP INSTRUCTIONS YOU RECEIVED FROM YOUR SURGEON'S OFFICE!!!     Oral Hygiene is also important to reduce your risk of infection.                                    Remember - BRUSH YOUR TEETH THE MORNING OF SURGERY WITH YOUR REGULAR TOOTHPASTE  DENTURES WILL BE REMOVED PRIOR TO SURGERY PLEASE DO NOT APPLY Poly grip OR ADHESIVES!!!   Stop all vitamins and herbal supplements 7 days before surgery.  Take these medicines the morning of surgery with A SIP OF WATER: Keppra , Percocet   How to Manage Your Diabetes Before and After Surgery  Why is it important to control my blood sugar before and after surgery? Improving blood sugar levels before and after surgery helps healing and can limit problems. A way of improving blood sugar control is eating a healthy diet by:  Eating less sugar and carbohydrates  Increasing activity/exercise  Talking with your doctor about reaching your blood sugar goals High blood sugars (greater than 180 mg/dL) can raise your risk of infections and slow your recovery, so you will need to focus on controlling your diabetes during the weeks before surgery. Make sure that the doctor who takes care of your diabetes knows about your planned surgery including the date and location.  How do I manage my blood sugar before surgery? Check your blood sugar at least 4 times a day, starting 2 days before surgery, to make sure that the level is not too high or low. Check your blood sugar the morning of your surgery when you wake up and every 2 hours until you get to the Short Stay unit. If your blood sugar is less than 70 mg/dL, you will need to treat for low blood sugar: Do not take insulin . Treat a low blood sugar (less than 70 mg/dL) with  cup of clear juice (cranberry or apple), 4 glucose tablets, OR glucose gel. Recheck blood sugar in 15 minutes after treatment (to make sure it is greater than 70 mg/dL). If your blood sugar is not greater than 70  mg/dL on recheck, call [phone] for further instructions. Report your blood sugar to the short stay nurse when you get to Short Stay.  If you are admitted to the hospital after surgery: Your blood sugar will be checked by the staff and you will probably be given insulin  after surgery (instead of oral diabetes medicines) to make sure you have good blood sugar levels. The goal for blood sugar control after surgery is 80-180 mg/dL.   WHAT DO I DO ABOUT MY DIABETES MEDICATION?  Do not take oral diabetes medicines (pills) the morning of surgery.  Reviewed and Endorsed by [facility] Patient Education Committee, August 2015                              You may not have any metal on your body including jewelry, and body piercing             Do not wear lotions, powders, cologne, or deodorant              Men may shave face and neck.   Do not bring valuables to the hospital. [facility] IS NOT             RESPONSIBLE   FOR VALUABLES.   Contacts, glasses, dentures or bridgework may not be worn into surgery.  DO NOT BRING YOUR HOME MEDICATIONS TO THE HOSPITAL. PHARMACY WILL DISPENSE MEDICATIONS LISTED ON YOUR MEDICATION LIST TO YOU DURING YOUR ADMISSION IN THE HOSPITAL!    Patients discharged on the day of surgery will not be allowed to drive home.  Someone NEEDS to stay with you for the first 24 hours after anesthesia.              Please read over the following fact sheets you were given: IF YOU HAVE QUESTIONS ABOUT YOUR PRE-OP INSTRUCTIONS PLEASE CALL  [phone] [place].   If you received a COVID test during your pre-op visit  it is requested that you wear a mask when out in public, stay away from anyone that may not be feeling well and notify your surgeon if you develop symptoms. If you test positive for Covid or have been in contact with anyone that has tested positive in the last 10 days please notify you surgeon.      Pre-operative 4 CHG Bath Instructions  DYNA-Hex 4  Chlorhexidine  Gluconate 4% Solution Antiseptic 4 fl. oz   You can play a key role in reducing the risk of infection after surgery. Your skin needs to be as free of germs as possible. You can reduce the number of germs on your skin by washing with CHG (chlorhexidine  gluconate) soap before surgery. CHG is an antiseptic soap that kills germs and continues to kill germs even after washing.   DO NOT use if you have an allergy to chlorhexidine /CHG or antibacterial soaps. If your skin becomes reddened or irritated, stop using the CHG and notify one of our RNs at   Please shower with the CHG soap starting 4 days before surgery using the following schedule:     Please keep in mind the following:  DO NOT shave, including legs and underarms, starting the day of your first shower.   You may shave your face at any point before/day of surgery.  Place clean sheets on your bed the day you start using CHG soap. Use a clean washcloth (not used since being washed) for each shower. DO NOT sleep with pets once you start using the CHG.  CHG Shower Instructions:  If you choose to wash your hair and private area, wash first with your normal shampoo/soap.  After you use shampoo/soap, rinse your hair and body thoroughly to remove shampoo/soap residue.  Turn the water OFF and apply about 3 tablespoons (45 ml) of CHG soap to a CLEAN washcloth.  Apply CHG soap ONLY FROM YOUR NECK DOWN TO YOUR TOES (washing for 3-5 minutes)  DO NOT use CHG soap on face, private areas, open wounds, or sores.  Pay special attention to the area where your surgery is being performed.  If you are having back surgery, having someone wash your back for you may be helpful. Wait 2 minutes after CHG soap is applied, then you may rinse off the CHG soap.  Pat dry with a clean towel  Put on clean clothes/pajamas   If you choose to wear lotion, please use ONLY the CHG-compatible lotions on the back of this paper.     Additional instructions for  the day of surgery: DO NOT APPLY any lotions, deodorants, cologne, or perfumes.   Put on clean/comfortable clothes.  Brush your teeth.  Ask your nurse before applying any prescription medications to the skin.   CHG Compatible Lotions   Aveeno Moisturizing lotion  Cetaphil Moisturizing Cream  Cetaphil Moisturizing Lotion  Clairol Herbal Essence Moisturizing Lotion, Dry Skin  Clairol Herbal Essence Moisturizing Lotion, Extra Dry Skin  Clairol Herbal Essence Moisturizing Lotion, Normal Skin  Curel Age Defying Therapeutic Moisturizing Lotion with Alpha Hydroxy  Curel Extreme Care Body Lotion  Curel Soothing Hands Moisturizing Hand Lotion  Curel Therapeutic Moisturizing Cream, Fragrance-Free  Curel Therapeutic Moisturizing Lotion, Fragrance-Free  Curel Therapeutic Moisturizing Lotion, Original Formula  Eucerin Daily Replenishing Lotion  Eucerin Dry Skin Therapy Plus Alpha Hydroxy Crme  Eucerin Dry Skin Therapy Plus Alpha Hydroxy Lotion  Eucerin Original Crme  Eucerin  Original Lotion  Eucerin Plus Crme Eucerin Plus Lotion  Eucerin TriLipid Replenishing Lotion  Keri Anti-Bacterial Hand Lotion  Keri Deep Conditioning Original Lotion Dry Skin Formula Softly Scented  Keri Deep Conditioning Original Lotion, Fragrance Free Sensitive Skin Formula  Keri Lotion Fast Absorbing Fragrance Free Sensitive Skin Formula  Keri Lotion Fast Absorbing Softly Scented Dry Skin Formula  Keri Original Lotion  Keri Skin Renewal Lotion Keri Silky Smooth Lotion  Keri Silky Smooth Sensitive Skin Lotion  Nivea Body Creamy Conditioning Oil  Nivea Body Extra Enriched Lotion  Nivea Body Original Lotion  Nivea Body Sheer Moisturizing Lotion Nivea Crme  Nivea Skin Firming Lotion  NutraDerm 30 Skin Lotion  NutraDerm Skin Lotion  NutraDerm Therapeutic Skin Cream  NutraDerm Therapeutic Skin Lotion  ProShield Protective Hand Cream  Provon moisturizing lotion  [facility]- Preparing for Total Shoulder  Arthroplasty    Before surgery, you can play an important role. Because skin is not sterile, your skin needs to be as free of germs as possible. You can reduce the number of germs on your skin by using the following products. Benzoyl Peroxide Gel Reduces the number of germs present on the skin Applied twice a day to shoulder area starting two days before surgery    ==================================================================  Please follow these instructions carefully:  BENZOYL PEROXIDE 5% GEL  Please do not use if you have an allergy to benzoyl peroxide.   If your skin becomes reddened/irritated stop using the benzoyl peroxide.  Starting two days before surgery, apply as follows: Apply benzoyl peroxide in the morning and at night. Apply after taking a shower. If you are not taking a shower clean entire shoulder front, back, and side along with the armpit with a clean wet washcloth.  Place a quarter-sized dollop on your shoulder and rub in thoroughly, making sure to cover the front, back, and side of your shoulder, along with the armpit.   2 days before ____ AM   ____ PM              1 day before ____ AM   ____ PM                         Do this twice a day for two days.  (Last application is the night before surgery, AFTER using the CHG soap as described below).  Do NOT apply benzoyl peroxide gel on the day of surgery.  Incentive Spirometer  An incentive spirometer is a tool that can help keep your lungs clear and active. This tool measures how well you are filling your lungs with each breath. Taking long deep breaths may help reverse or decrease the chance of developing breathing (pulmonary) problems (especially infection) following: A long period of time when you are unable to move or be active. BEFORE THE PROCEDURE  If the spirometer includes an indicator to show your best effort, your nurse or respiratory therapist will set it to a desired goal. If possible, sit up straight  or lean slightly forward. Try not to slouch. Hold the incentive spirometer in an upright position. INSTRUCTIONS FOR USE  Sit on the edge of your bed if possible, or sit up as far as you can in bed or on a chair. Hold the incentive spirometer in an upright position. Breathe out normally. Place the mouthpiece in your mouth and seal your lips tightly around it. Breathe in slowly and as deeply as possible, raising the piston or the ball  toward the top of the column. Hold your breath for 3-5 seconds or for as long as possible. Allow the piston or ball to fall to the bottom of the column. Remove the mouthpiece from your mouth and breathe out normally. Rest for a few seconds and repeat Steps 1 through 7 at least 10 times every 1-2 hours when you are awake. Take your time and take a few normal breaths between deep breaths. The spirometer may include an indicator to show your best effort. Use the indicator as a goal to work toward during each repetition. After each set of 10 deep breaths, practice coughing to be sure your lungs are clear. If you have an incision (the cut made at the time of surgery), support your incision when coughing by placing a pillow or rolled up towels firmly against it. Once you are able to get out of bed, walk around indoors and cough well. You may stop using the incentive spirometer when instructed by your caregiver.  RISKS AND COMPLICATIONS Take your time so you do not get dizzy or light-headed. If you are in pain, you may need to take or ask for pain medication before doing incentive spirometry. It is harder to take a deep breath if you are having pain. AFTER USE Rest and breathe slowly and easily. It can be helpful to keep track of a log of your progress. Your caregiver can provide you with a simple table to help with this. If you are using the spirometer at home, follow these instructions: SEEK MEDICAL CARE IF:  You are having difficultly using the spirometer. You have  trouble using the spirometer as often as instructed. Your pain medication is not giving enough relief while using the spirometer. You develop fever of 100.5 F (38.1 C) or higher. SEEK IMMEDIATE MEDICAL CARE IF:  You cough up bloody sputum that had not been present before. You develop fever of 102 F (38.9 C) or greater. You develop worsening pain at or near the incision site. MAKE SURE YOU:  Understand these instructions. Will watch your condition. Will get help right away if you are not doing well or get worse. Document Released: 06/02/2006 Document Revised: 04/14/2011 Document Reviewed: 08/03/2006 [facility] Patient Information 2014 [place], [place].   ________________________________________________________________________

## 2023-12-28 ENCOUNTER — Other Ambulatory Visit: Payer: Self-pay

## 2023-12-28 ENCOUNTER — Encounter (HOSPITAL_COMMUNITY): Payer: Self-pay

## 2023-12-28 ENCOUNTER — Encounter (HOSPITAL_COMMUNITY)
Admission: RE | Admit: 2023-12-28 | Discharge: 2023-12-28 | Disposition: A | Discharge status: Home / Self Care | Source: Ambulatory Visit | Attending: Orthopedic Surgery | Admitting: Orthopedic Surgery

## 2023-12-28 VITALS — BP 144/100 | HR 70 | Temp 98.4°F | Resp 18 | Ht 69.0 in | Wt 250.0 lb

## 2023-12-28 DIAGNOSIS — M19011 Primary osteoarthritis, right shoulder: Secondary | ICD-10-CM | POA: Diagnosis not present

## 2023-12-28 DIAGNOSIS — I10 Essential (primary) hypertension: Secondary | ICD-10-CM | POA: Insufficient documentation

## 2023-12-28 DIAGNOSIS — Z01818 Encounter for other preprocedural examination: Secondary | ICD-10-CM | POA: Diagnosis not present

## 2023-12-28 DIAGNOSIS — Z7984 Long term (current) use of oral hypoglycemic drugs: Secondary | ICD-10-CM | POA: Insufficient documentation

## 2023-12-28 DIAGNOSIS — E1165 Type 2 diabetes mellitus with hyperglycemia: Secondary | ICD-10-CM | POA: Diagnosis not present

## 2023-12-28 DIAGNOSIS — G894 Chronic pain syndrome: Secondary | ICD-10-CM | POA: Diagnosis not present

## 2023-12-28 DIAGNOSIS — E119 Type 2 diabetes mellitus without complications: Secondary | ICD-10-CM | POA: Diagnosis not present

## 2023-12-28 LAB — BASIC METABOLIC PANEL WITH GFR
Anion gap: 11 (ref 5–15)
BUN: 15 mg/dL (ref 6–20)
CO2: 28 mmol/L (ref 22–32)
Calcium: 10.3 mg/dL (ref 8.9–10.3)
Chloride: 102 mmol/L (ref 98–111)
Creatinine, Ser: 1.07 mg/dL (ref 0.61–1.24)
GFR, Estimated: >60 mL/min (ref >60)
Glucose, Bld: 181 mg/dL — ABNORMAL HIGH (ref 70–99)
Potassium: 5.1 mmol/L (ref 3.5–5.1)
Sodium: 141 mmol/L (ref 135–145)

## 2023-12-28 LAB — HEMOGLOBIN A1C
Hgb A1c MFr Bld: 8.6 % — ABNORMAL HIGH (ref 4.8–5.6)
Mean Plasma Glucose: 200 mg/dL

## 2023-12-28 LAB — CBC
HCT: 45.1 % (ref 39.0–52.0)
Hemoglobin: 15 g/dL (ref 13.0–17.0)
MCH: 30.7 pg (ref 26.0–34.0)
MCHC: 33.3 g/dL (ref 30.0–36.0)
MCV: 92.4 fL (ref 80.0–100.0)
Platelets: 284 K/uL (ref 150–400)
RBC: 4.88 MIL/uL (ref 4.22–5.81)
RDW: 11.9 % (ref 11.5–15.5)
WBC: 10.4 K/uL (ref 4.0–10.5)
nRBC: 0 % (ref 0.0–0.2)

## 2023-12-28 LAB — SURGICAL PCR SCREEN
MRSA, PCR: NEGATIVE
Staphylococcus aureus: NEGATIVE

## 2023-12-28 LAB — GLUCOSE, CAPILLARY: Glucose-Capillary: 168 mg/dL — ABNORMAL HIGH (ref 70–99)

## 2023-12-29 ENCOUNTER — Encounter (HOSPITAL_COMMUNITY): Payer: Self-pay

## 2023-12-29 NOTE — Progress Notes (Signed)
 A1C 8.6 results routed to Dr. Kay

## 2023-12-29 NOTE — Progress Notes (Signed)
 Case: [id number] Date/Time: 01/08/24 0715   Procedure: ARTHROPLASTY, SHOULDER, TOTAL (Right: Shoulder)   Anesthesia type: Choice   Diagnosis: Arthritis of right shoulder region [M19.011]   Pre-op diagnosis: Osteoarthritis of shoulder region   Location: [facility] ROOM 06 / [facility]   Surgeons: Ivan Kemps, MD       DISCUSSION: Ivan Jones is a 48 yo male with PMH of HTN, transverse myelitis, uncontrolled T2DM (A1c 8.6), chronic pain syndrome, anxiety  Prior complication from anesthesia includes intra op awareness  Patient follows with neurology for history of transverse myelitis and possible relapsing remitting MS.  Followed by Dr. Vear and last seen by him on 11/09/23.  Symptoms noted to be worsening and he was referred to [facility].  Seen on 12/14/2023 and following plan recommended:  He continues to experience persistent left arm and bilateral leg weakness. Imaging shows stable lesion burden with no new lesions. Although multiple sclerosis is a differential, current evidence supports transverse myelitis. Persistent enhancement in the cervical cord is likely due to a vascular anomaly, with no new inflammatory events. Briumvi  reduces new lesion development and MS risk by 80%. Continue Briumvi  infusions as a precaution against potential autoimmune events. Consider repeat spinal fluid analysis or vessel imaging if discontinuing Briumvi . Focus on symptom management and functional improvement.  History of diabetes.  A1c 8.6.  CBG 160-180 in preop.  Results routed to Dr. Kay  VS: BP (!) 144/100   Pulse 70   Temp 36.9 C (Oral)   Resp 18   Ht 5' 9 (1.753 m)   Wt 113.4 kg   SpO2 100%   BMI 36.92 kg/m   PROVIDERS: Ivan Marcellus RAMAN, MD   LABS: Labs reviewed: Acceptable for surgery. (all labs ordered are listed, but only abnormal results are displayed)  Labs Reviewed  HEMOGLOBIN A1C - Abnormal; Notable for the following components:      Result Value   Hgb A1c MFr Bld 8.6 (*)    All other  components within normal limits  BASIC METABOLIC PANEL WITH GFR - Abnormal; Notable for the following components:   Glucose, Bld 181 (*)    All other components within normal limits  GLUCOSE, CAPILLARY - Abnormal; Notable for the following components:   Glucose-Capillary 168 (*)    All other components within normal limits  SURGICAL PCR SCREEN  CBC     IMAGES:   EKG 12/28/2023  Normal sinus rhythm Right bundle branch block No significant change from prior   CV:  Past Medical History:  Diagnosis Date   Arthritis    knees and shoulder   Chronic pain syndrome    Complication of anesthesia    wakes up during the end of surgery multiple times   Diabetes mellitus without complication (HCC)    type 2   History of kidney stones    Hypertension    Neuromuscular disorder (HCC)    Transverse Myelitis- now has MS, has ataxic gait, hand tremors    Past Surgical History:  Procedure Laterality Date   COLON SURGERY  2018   diverticulitis 15 inches rescected   COLONOSCOPY     several   KNEE ARTHROSCOPY Right    3 times   TOTAL KNEE ARTHROPLASTY Right 01/15/2017   Procedure: RIGHT TOTAL KNEE ARTHROPLASTY;  Surgeon: Duwayne Purchase, MD;  Location: [facility];  Service: Orthopedics;  Laterality: Right;  2 hrs    MEDICATIONS:  ALPRAZolam  (XANAX ) 1 MG tablet   aspirin  EC 81 MG tablet   irbesartan (AVAPRO)  75 MG tablet   levETIRAcetam  (KEPPRA ) 750 MG tablet   meloxicam  (MOBIC ) 15 MG tablet   NON FORMULARY   oxyCODONE -acetaminophen  (PERCOCET) 10-325 MG tablet   SitaGLIPtin -MetFORMIN  HCl [phone] MG TB24   tiZANidine  (ZANAFLEX ) 4 MG tablet   trolamine salicylate (BLUE-EMU HEMP) 10 % cream   No current facility-administered medications for this encounter.   Ivan Jones/[facility] Short Stay/Anesthesiology [facility] Phone [phone] 12/29/2023 1:26 PM

## 2024-01-07 NOTE — Anesthesia Preprocedure Evaluation (Addendum)
 Anesthesia Evaluation  Patient identified by MRN, date of birth, ID band Patient awake    Reviewed: Allergy & Precautions, H&P , NPO status , Patient's Chart, lab work & pertinent test results  Airway Mallampati: III  TM Distance: >3 FB Neck ROM: Full    Dental no notable dental hx. (+) Teeth Intact, Dental Advisory Given   Pulmonary neg pulmonary ROS   Pulmonary exam normal breath sounds clear to auscultation       Cardiovascular Exercise Tolerance: Good hypertension, Pt. on medications  Rhythm:Regular Rate:Normal     Neuro/Psych  Neuromuscular disease  negative psych ROS   GI/Hepatic negative GI ROS, Neg liver ROS,,,  Endo/Other  diabetes, Type 2  Class 3 obesity  Renal/GU negative Renal ROS  negative genitourinary   Musculoskeletal  (+) Arthritis , Osteoarthritis,    Abdominal   Peds  Hematology negative hematology ROS (+)   Anesthesia Other Findings   Reproductive/Obstetrics negative OB ROS                              Anesthesia Physical Anesthesia Plan  ASA: 3  Anesthesia Plan: General   Post-op Pain Management: Regional block* and Tylenol  PO (pre-op)*   Induction: Intravenous  PONV Risk Score and Plan: 2 and Ondansetron , Dexamethasone  and Midazolam   Airway Management Planned: Oral ETT  Additional Equipment:   Intra-op Plan:   Post-operative Plan: Extubation in OR  Informed Consent: I have reviewed the patients History and Physical, chart, labs and discussed the procedure including the risks, benefits and alternatives for the proposed anesthesia with the patient or authorized representative who has indicated his/her understanding and acceptance.     Dental advisory given  Plan Discussed with: CRNA  Anesthesia Plan Comments:          Anesthesia Quick Evaluation

## 2024-01-08 ENCOUNTER — Ambulatory Visit (HOSPITAL_COMMUNITY): Payer: Self-pay

## 2024-01-08 ENCOUNTER — Other Ambulatory Visit: Payer: Self-pay

## 2024-01-08 ENCOUNTER — Encounter (HOSPITAL_COMMUNITY)
Admission: RE | Disposition: A | Discharge status: Home / Self Care | Payer: Self-pay | Source: Ambulatory Visit | Attending: Orthopedic Surgery

## 2024-01-08 ENCOUNTER — Encounter (HOSPITAL_COMMUNITY): Payer: Self-pay | Admitting: Orthopedic Surgery

## 2024-01-08 ENCOUNTER — Ambulatory Visit (HOSPITAL_COMMUNITY)

## 2024-01-08 ENCOUNTER — Ambulatory Visit (HOSPITAL_COMMUNITY)
Admission: RE | Admit: 2024-01-08 | Discharge: 2024-01-08 | Disposition: A | Source: Ambulatory Visit | Attending: Orthopedic Surgery | Admitting: Orthopedic Surgery

## 2024-01-08 ENCOUNTER — Ambulatory Visit (HOSPITAL_COMMUNITY): Payer: Self-pay | Admitting: Physician Assistant

## 2024-01-08 DIAGNOSIS — G8918 Other acute postprocedural pain: Secondary | ICD-10-CM | POA: Diagnosis not present

## 2024-01-08 DIAGNOSIS — E66813 Obesity, class 3: Secondary | ICD-10-CM | POA: Diagnosis not present

## 2024-01-08 DIAGNOSIS — I10 Essential (primary) hypertension: Secondary | ICD-10-CM | POA: Insufficient documentation

## 2024-01-08 DIAGNOSIS — M19011 Primary osteoarthritis, right shoulder: Secondary | ICD-10-CM | POA: Insufficient documentation

## 2024-01-08 DIAGNOSIS — E119 Type 2 diabetes mellitus without complications: Secondary | ICD-10-CM | POA: Insufficient documentation

## 2024-01-08 DIAGNOSIS — Z6836 Body mass index (BMI) 36.0-36.9, adult: Secondary | ICD-10-CM | POA: Diagnosis not present

## 2024-01-08 DIAGNOSIS — Z96611 Presence of right artificial shoulder joint: Secondary | ICD-10-CM | POA: Diagnosis not present

## 2024-01-08 DIAGNOSIS — Z79899 Other long term (current) drug therapy: Secondary | ICD-10-CM | POA: Diagnosis not present

## 2024-01-08 DIAGNOSIS — Z471 Aftercare following joint replacement surgery: Secondary | ICD-10-CM | POA: Diagnosis not present

## 2024-01-08 HISTORY — PX: TOTAL SHOULDER ARTHROPLASTY: SHX126

## 2024-01-08 LAB — GLUCOSE, CAPILLARY
Glucose-Capillary: 153 mg/dL — ABNORMAL HIGH (ref 70–99)
Glucose-Capillary: 194 mg/dL — ABNORMAL HIGH (ref 70–99)

## 2024-01-08 SURGERY — ARTHROPLASTY, SHOULDER, TOTAL
Anesthesia: General | Site: Shoulder | Laterality: Right

## 2024-01-08 MED ORDER — PROPOFOL 10 MG/ML IV BOLUS
INTRAVENOUS | Status: AC
  Filled 2024-01-08: qty 20

## 2024-01-08 MED ORDER — THROMBIN (RECOMBINANT) 5000 UNITS EX SOLR
CUTANEOUS | Status: AC
  Filled 2024-01-08: qty 5000

## 2024-01-08 MED ORDER — LIDOCAINE HCL (CARDIAC) PF 100 MG/5ML IV SOSY
PREFILLED_SYRINGE | INTRAVENOUS | Status: DC | PRN
  Administered 2024-01-08: 40 mg via INTRAVENOUS

## 2024-01-08 MED ORDER — MIDAZOLAM HCL 5 MG/5ML IJ SOLN
INTRAMUSCULAR | Status: DC | PRN
  Administered 2024-01-08: 2 mg via INTRAVENOUS

## 2024-01-08 MED ORDER — ORAL CARE MOUTH RINSE: 15.0000 mL | Freq: Once | OROMUCOSAL | Status: AC

## 2024-01-08 MED ORDER — MIDAZOLAM HCL 2 MG/2ML IJ SOLN
INTRAMUSCULAR | Status: AC
  Filled 2024-01-08: qty 2

## 2024-01-08 MED ORDER — FENTANYL CITRATE (PF) 100 MCG/2ML IJ SOLN
INTRAMUSCULAR | Status: AC
  Filled 2024-01-08: qty 2

## 2024-01-08 MED ORDER — SUGAMMADEX SODIUM 200 MG/2ML IV SOLN
INTRAVENOUS | Status: DC | PRN
  Administered 2024-01-08: 200 mg via INTRAVENOUS

## 2024-01-08 MED ORDER — CEFAZOLIN SODIUM-DEXTROSE 2-4 GM/100ML-% IV SOLN
2.0000 g | INTRAVENOUS | Status: AC
  Administered 2024-01-08: 2 g via INTRAVENOUS
  Filled 2024-01-08: qty 100

## 2024-01-08 MED ORDER — BUPIVACAINE-EPINEPHRINE (PF) 0.25% -1:200000 IJ SOLN
INTRAMUSCULAR | Status: DC | PRN
  Administered 2024-01-08: 16 mL

## 2024-01-08 MED ORDER — ONDANSETRON HCL 4 MG PO TABS: 4.0000 mg | ORAL_TABLET | Freq: Three times a day (TID) | ORAL | 1 refills | Status: AC | PRN

## 2024-01-08 MED ORDER — PROPOFOL 10 MG/ML IV BOLUS
INTRAVENOUS | Status: DC | PRN
  Administered 2024-01-08: 250 mg via INTRAVENOUS

## 2024-01-08 MED ORDER — ROCURONIUM BROMIDE 100 MG/10ML IV SOLN
INTRAVENOUS | Status: DC | PRN
  Administered 2024-01-08: 40 mg via INTRAVENOUS

## 2024-01-08 MED ORDER — LIDOCAINE HCL (PF) 2 % IJ SOLN
INTRAMUSCULAR | Status: AC
  Filled 2024-01-08: qty 5

## 2024-01-08 MED ORDER — ACETAMINOPHEN 500 MG PO TABS
1000.0000 mg | ORAL_TABLET | Freq: Once | ORAL | Status: AC
  Administered 2024-01-08: 1000 mg via ORAL
  Filled 2024-01-08: qty 2

## 2024-01-08 MED ORDER — STERILE WATER FOR IRRIGATION IR SOLN
Status: DC | PRN
  Administered 2024-01-08: 2000 mL

## 2024-01-08 MED ORDER — DEXAMETHASONE SOD PHOSPHATE PF 10 MG/ML IJ SOLN
INTRAMUSCULAR | Status: DC | PRN
  Administered 2024-01-08: 4 mg via INTRAVENOUS

## 2024-01-08 MED ORDER — BUPIVACAINE-EPINEPHRINE (PF) 0.25% -1:200000 IJ SOLN
INTRAMUSCULAR | Status: AC
  Filled 2024-01-08: qty 30

## 2024-01-08 MED ORDER — PHENYLEPHRINE HCL-NACL 20-0.9 MG/250ML-% IV SOLN
INTRAVENOUS | Status: DC | PRN
  Administered 2024-01-08: 20 ug/min via INTRAVENOUS

## 2024-01-08 MED ORDER — HYDROMORPHONE HCL 1 MG/ML IJ SOLN: 0.2500 mg | INTRAMUSCULAR | Status: DC | PRN

## 2024-01-08 MED ORDER — CEPHALEXIN 500 MG PO CAPS: 500.0000 mg | ORAL_CAPSULE | Freq: Four times a day (QID) | ORAL | 0 refills | Status: AC

## 2024-01-08 MED ORDER — TIZANIDINE HCL 4 MG PO TABS: 4.0000 mg | ORAL_TABLET | Freq: Three times a day (TID) | ORAL | 1 refills | Status: AC | PRN

## 2024-01-08 MED ORDER — LACTATED RINGERS IV SOLN: INTRAVENOUS | Status: DC

## 2024-01-08 MED ORDER — 0.9 % SODIUM CHLORIDE (POUR BTL) OPTIME
TOPICAL | Status: DC | PRN
  Administered 2024-01-08: 1000 mL

## 2024-01-08 MED ORDER — TRANEXAMIC ACID-NACL 1000-0.7 MG/100ML-% IV SOLN
1000.0000 mg | INTRAVENOUS | Status: AC
  Administered 2024-01-08: 1000 mg via INTRAVENOUS
  Filled 2024-01-08: qty 100

## 2024-01-08 MED ORDER — HYDROMORPHONE HCL 2 MG PO TABS: 2.0000 mg | ORAL_TABLET | Freq: Four times a day (QID) | ORAL | 0 refills | Status: AC | PRN

## 2024-01-08 MED ORDER — CHLORHEXIDINE GLUCONATE 0.12 % MT SOLN
15.0000 mL | Freq: Once | OROMUCOSAL | Status: AC
  Administered 2024-01-08: 15 mL via OROMUCOSAL

## 2024-01-08 MED ORDER — BUPIVACAINE-EPINEPHRINE (PF) 0.5% -1:200000 IJ SOLN
INTRAMUSCULAR | Status: DC | PRN
  Administered 2024-01-08: 15 mL via PERINEURAL

## 2024-01-08 MED ORDER — ONDANSETRON HCL 4 MG/2ML IJ SOLN
INTRAMUSCULAR | Status: DC | PRN
  Administered 2024-01-08: 4 mg via INTRAVENOUS

## 2024-01-08 MED ORDER — INSULIN ASPART 100 UNIT/ML IJ SOLN
0.0000 [IU] | INTRAMUSCULAR | Status: DC | PRN
  Administered 2024-01-08: 2 [IU] via SUBCUTANEOUS
  Filled 2024-01-08: qty 2

## 2024-01-08 MED ORDER — FENTANYL CITRATE (PF) 100 MCG/2ML IJ SOLN
INTRAMUSCULAR | Status: DC | PRN
  Administered 2024-01-08 (×2): 50 ug via INTRAVENOUS

## 2024-01-08 MED ORDER — BUPIVACAINE LIPOSOME 1.3 % IJ SUSP
INTRAMUSCULAR | Status: DC | PRN
  Administered 2024-01-08: 10 mL via PERINEURAL

## 2024-01-08 MED ORDER — THROMBIN 5000 UNITS EX SOLR
OROMUCOSAL | Status: DC | PRN
  Administered 2024-01-08: 5000 mL via TOPICAL

## 2024-01-08 SURGICAL SUPPLY — 54 items
BAG COUNTER SPONGE SURGICOUNT (BAG) IMPLANT
BAG ZIPLOCK 12X15 (MISCELLANEOUS) IMPLANT
BIT DRILL 1.6MX128 (BIT) IMPLANT
BLADE SAG 18X100X1.27 (BLADE) ×1 IMPLANT
BODY ANATOMIC PROXIMAL SZ10 (Shoulder) IMPLANT
CEMENT HV SMART SET (Cement) ×1 IMPLANT
COVER BACK TABLE 60X90IN (DRAPES) ×1 IMPLANT
COVER SURGICAL LIGHT HANDLE (MISCELLANEOUS) ×1 IMPLANT
DRAPE INCISE IOBAN 66X45 STRL (DRAPES) ×1 IMPLANT
DRAPE SHEET LG 3/4 BI-LAMINATE (DRAPES) ×1 IMPLANT
DRAPE SURG ORHT 6 SPLT 77X108 (DRAPES) ×2 IMPLANT
DRAPE U-SHAPE 47X51 STRL (DRAPES) ×1 IMPLANT
DRSG ADAPTIC 3X8 NADH LF (GAUZE/BANDAGES/DRESSINGS) ×1 IMPLANT
DURAPREP 26ML APPLICATOR (WOUND CARE) ×1 IMPLANT
ELECT BLADE TIP CTD 4 INCH (ELECTRODE) ×1 IMPLANT
ELECT NDL TIP 2.8 STRL (NEEDLE) ×1 IMPLANT
ELECT NEEDLE TIP 2.8 STRL (NEEDLE) ×1 IMPLANT
ELECT PENCIL ROCKER SW 15FT (MISCELLANEOUS) ×1 IMPLANT
ELECT REM PT RETURN 15FT ADLT (MISCELLANEOUS) ×1 IMPLANT
FACESHIELD WRAPAROUND OR TEAM (MASK) ×2 IMPLANT
GAUZE PAD ABD 8X10 STRL (GAUZE/BANDAGES/DRESSINGS) ×1 IMPLANT
GAUZE SPONGE 4X4 12PLY STRL (GAUZE/BANDAGES/DRESSINGS) ×1 IMPLANT
GEL BONE GRAFT DBM ALLOSYNC 5 (Bone Implant) IMPLANT
GLENOID ANCHOR PEG CROSSLK 44 (Orthopedic Implant) IMPLANT
GLOVE BIOGEL PI IND STRL 7.0 (GLOVE) IMPLANT
GLOVE BIOGEL PI IND STRL 7.5 (GLOVE) ×1 IMPLANT
GLOVE BIOGEL PI IND STRL 8.5 (GLOVE) ×1 IMPLANT
GLOVE ORTHO TXT STRL SZ7.5 (GLOVE) ×1 IMPLANT
GLOVE SURG ORTHO 8.5 STRL (GLOVE) ×1 IMPLANT
GOWN STRL REUS W/ TWL XL LVL3 (GOWN DISPOSABLE) ×2 IMPLANT
HEAD ECC HUMERAL SZ 48MMX18MM (Head) IMPLANT
KIT BASIN OR (CUSTOM PROCEDURE TRAY) ×1 IMPLANT
KIT TURNOVER KIT A (KITS) ×1 IMPLANT
MANIFOLD NEPTUNE II (INSTRUMENTS) ×1 IMPLANT
NDL MAYO CATGUT SZ4 TPR NDL (NEEDLE) IMPLANT
NEEDLE MAYO CATGUT SZ4 (NEEDLE) IMPLANT
PACK SHOULDER (CUSTOM PROCEDURE TRAY) ×1 IMPLANT
PIN METAGLENE 2.5 (PIN) IMPLANT
RESTRAINT HEAD UNIVERSAL NS (MISCELLANEOUS) ×1 IMPLANT
SLING ARM FOAM STRAP LRG (SOFTGOODS) ×1 IMPLANT
SLING ARM FOAM STRAP XLG (SOFTGOODS) IMPLANT
SPIKE FLUID TRANSFER (MISCELLANEOUS) ×1 IMPLANT
SPONGE SURGIFOAM ABS GEL 12-7 (HEMOSTASIS) IMPLANT
SPONGE T-LAP 4X18 ~~LOC~~+RFID (SPONGE) ×2 IMPLANT
STEM STD SZ 10 113MM (Stem) IMPLANT
STRIP CLOSURE SKIN 1/2X4 (GAUZE/BANDAGES/DRESSINGS) ×1 IMPLANT
SUCTION TUBE FRAZIER 12FR DISP (SUCTIONS) ×1 IMPLANT
SUT MNCRL AB 4-0 PS2 18 (SUTURE) ×1 IMPLANT
SUT VIC AB 0 CT1 36 (SUTURE) ×1 IMPLANT
SUT VIC AB 0 CT2 27 (SUTURE) ×1 IMPLANT
SUT VIC AB 2-0 CT1 TAPERPNT 27 (SUTURE) ×1 IMPLANT
SUTURE FIBERWR #2 38 T-5 BLUE (SUTURE) ×4 IMPLANT
TOWEL OR 17X26 10 PK STRL BLUE (TOWEL DISPOSABLE) ×1 IMPLANT
TOWER SMARTMIX MINI (MISCELLANEOUS) ×1 IMPLANT

## 2024-01-08 NOTE — Anesthesia Procedure Notes (Signed)
 Anesthesia Regional Block: Interscalene brachial plexus block   Pre-Anesthetic Checklist: , timeout performed,  Correct Patient, Correct Site, Correct Laterality,  Correct Procedure, Correct Position, site marked,  Risks and benefits discussed,  Pre-op evaluation,  At surgeon's request and post-op pain management  Laterality: Right  Prep: Maximum Sterile Barrier Precautions used, chloraprep       Needles:  Injection technique: Single-shot  Needle Type: Echogenic Stimulator Needle     Needle Length: 5cm  Needle Gauge: 22     Additional Needles:   Procedures:,,,, ultrasound used (permanent image in chart),,    Narrative:  Start time: 01/08/2024 6:50 AM End time: 01/08/2024 7:00 AM Injection made incrementally with aspirations every 5 mL.  Performed by: Personally  Anesthesiologist: Epifanio Fallow, MD

## 2024-01-08 NOTE — Anesthesia Postprocedure Evaluation (Signed)
 Anesthesia Post Note  Patient: Ivan Jones [facility]  Procedure(s) Performed: ARTHROPLASTY, SHOULDER, TOTAL (Right: Shoulder)     Patient location during evaluation: PACU Anesthesia Type: General and Regional Level of consciousness: awake and alert Pain management: pain level controlled Vital Signs Assessment: post-procedure vital signs reviewed and stable Respiratory status: spontaneous breathing, nonlabored ventilation and respiratory function stable Cardiovascular status: blood pressure returned to baseline and stable Postop Assessment: no apparent nausea or vomiting Anesthetic complications: no   No notable events documented.  Last Vitals:  Vitals:   01/08/24 1030 01/08/24 1044  BP: 133/85 (!) 160/97  Pulse: 69 73  Resp: 11 14  Temp:  36.7 C  SpO2: 96% 92%    Last Pain:  Vitals:   01/08/24 1044  TempSrc: Oral  PainSc: 0-No pain                 Michah Minton,W. EDMOND

## 2024-01-08 NOTE — Transfer of Care (Signed)
 Immediate Anesthesia Transfer of Care Note  Patient: Ivan Jones [facility]  Procedure(s) Performed: ARTHROPLASTY, SHOULDER, TOTAL (Right: Shoulder)  Patient Location: PACU  Anesthesia Type:General  Level of Consciousness: awake, alert , and patient cooperative  Airway & Oxygen Therapy: Patient Spontanous Breathing and Patient connected to face mask oxygen  Post-op Assessment: Report given to RN and Post -op Vital signs reviewed and stable  Post vital signs: Reviewed and stable  Last Vitals:  Vitals Value Taken Time  BP 148/98 01/08/24 09:49  Temp    Pulse 76 01/08/24 09:51  Resp 16 01/08/24 09:51  SpO2 100 % 01/08/24 09:51  Vitals shown include unfiled device data.  Last Pain:  Vitals:   01/08/24 0603  TempSrc: Oral  PainSc:          Complications: No notable events documented.

## 2024-01-08 NOTE — Brief Op Note (Signed)
 01/08/2024  9:59 AM  PATIENT:  Alm Lonni Chandler  48 y.o. male  PRE-OPERATIVE DIAGNOSIS:  Osteoarthritis of shoulder region  POST-OPERATIVE DIAGNOSIS:  Osteoarthritis of shoulder region  PROCEDURE:  Procedure(s): ARTHROPLASTY, SHOULDER, TOTAL (Right) [employer] with APG glenoid  SURGEON:  Surgeons and Role:    DEWAINE [name], MD - Primary  PHYSICIAN ASSISTANT:   ASSISTANTS: Debby KATHEE Fireman, PA-C   ANESTHESIA:   regional and general  EBL:  200 mL   BLOOD ADMINISTERED:none  DRAINS: none   LOCAL MEDICATIONS USED:  MARCAINE      SPECIMEN:  No Specimen  DISPOSITION OF SPECIMEN:  N/A  COUNTS:  YES  TOURNIQUET:  * No tourniquets in log *  DICTATION: [id number]   PLAN OF CARE: Discharge to home after PACU  PATIENT DISPOSITION:  PACU - hemodynamically stable.   Delay start of Pharmacological VTE agent (>24hrs) due to surgical blood loss or risk of bleeding: not applicable

## 2024-01-08 NOTE — Evaluation (Signed)
 Occupational Therapy Evaluation Patient Details Name: Ivan Jones MRN: [id number] DOB: 04-Jan-1976 Today's Date: 01/08/2024   History of Present Illness   Ivan Jones is a 48 yr old male who is s/p R anatomic total shoulder arthroplasty on 01-08-24, due to R shoulder end stage OA.     Clinical Impressions Pt is s/p shoulder replacement of right upper extremity on 01-08-24. Therapist provided education and instruction to patient and his spouse with regards to ROM/exercise protocol, post-op precautions, upper extremity and sling positioning, donning/doffing upper extremity clothing, recommendations for bathing while maintaining shoulder precautions, recommendations for use of ice machine, use of ice for pain and edema management, non-weight-bearing status, sling wear schedule, and correctly donning/doffing sling. Patient and spouse verbalized and demonstrated understanding as needed. Patient needed assistance to donn shirt, underwear, pants, and shoes, with instruction provided on compensatory strategies to perform ADLs. Patient to follow physician's recommendations for discharge and potential follow-up therapy needs.      If plan is discharge home, recommend the following:   Help with stairs or ramp for entrance;Assistance with cooking/housework;Assist for transportation;A little help with bathing/dressing/bathroom     Functional Status Assessment   Patient has had a recent decline in their functional status and demonstrates the ability to make significant improvements in function in a reasonable and predictable amount of time.     Equipment Recommendations   None recommended by OT     Recommendations for Other Services         Precautions/Restrictions   Precautions Precautions: Shoulder Type of Shoulder Precautions: okay to perform gentle ADLs, okay to perform elbow, wrist, and hand ROM, no shoulder ROM, sling to be worn at all times except ADLs/exercise Shoulder  Interventions: Shoulder sling/immobilizer Precaution Booklet Issued: Yes (comment) Recall of Precautions/Restrictions: Intact Required Braces or Orthoses: Sling Restrictions Weight Bearing Restrictions Per Provider Order: Yes RUE Weight Bearing Per Provider Order: Non weight bearing     Mobility Bed Mobility               General bed mobility comments: pt was received seated in chair    Transfers Overall transfer level: Needs assistance   Transfers: Sit to/from Stand Sit to Stand: Contact guard assist                  Balance Overall balance assessment: No apparent balance deficits (not formally assessed)           ADL either performed or assessed with clinical judgement   ADL Overall ADL's : Needs assistance/impaired          Upper Body Dressing : Moderate assistance;Sitting;Cueing for compensatory techniques;Cueing for sequencing Upper Body Dressing Details (indicate cue type and reason): Pt and spouse were instructed on donning around the back shirt over surgical RUE first when dressing & reversing when undressing. Lower Body Dressing: Minimal assistance;Sitting/lateral leans;Cueing for compensatory techniques Lower Body Dressing Details (indicate cue type and reason): Patient required assist to donn his underwear, pants, and shoes.        General ADL Comments: Per orders, pt and his spouse were educated and instructed on no shoulder ROM, and okay to elbow/wrist/hand ROM. While moving within specified parameters, pt and spouse instructed on bathing, how to donn/doff shirt by placing operative arm through sleeve first when donning and off last when doffing. They were also educated on compensatory strategies for lower body ADLs, strategies to reduce risk of falls, correctly donning/doffing sling,  to wear the sling at all times with the  exception of ADLs and exercises, and to loosen the neck strap of the sling when the operative arm is in a supported position  when sitting. In sitting or supine, pt instructed to have a pillow behind and under their operative arm to provide support. If assist needed with ambulation, spouse educated on the importance of walking on pt's non-operative side.  Patient reported having his own ice machine at home, with OT emphasizing the importance of using post-op to manage pain and edema and the importance of using a barrier on the shoulder prior to positioning the wrap-on pad. Pt and spouse verbalized/demonstrated understanding. Teach Back used while spouse assisted with dressing pt.     Vision Patient Visual Report: No change from baseline              Pertinent Vitals/Pain Pain Assessment Pain Assessment: No/denies pain     Extremity/Trunk Assessment     Lower Extremity Assessment Lower Extremity Assessment: Overall WFL for tasks assessed       Communication Communication Communication: No apparent difficulties   Cognition Arousal: Alert Behavior During Therapy: WFL for tasks assessed/performed               OT - Cognition Comments: Oriented x4          Following commands: Intact       Cueing  General Comments   Cueing Techniques: Verbal cues         Shoulder Instructions Shoulder Instructions Donning/doffing shirt without moving shoulder: Moderate assistance Method for sponge bathing under operated UE:  (Patient and spouse verbalized understanding) Donning/doffing sling/immobilizer: Maximal assistance Correct positioning of sling/immobilizer: Maximal assistance Pendulum exercises (written home exercise program):  (N/A) ROM for elbow, wrist and digits of operated UE:  (Patient and spouse verbalized understanding) Sling wearing schedule (on at all times/off for ADL's):  (Patient and spouse verbalized understanding) Proper positioning of operated UE when showering:  (Patient and spouse verbalized understanding) Dressing change:  (Patient and spouse verbalized  understanding) Positioning of UE while sleeping:  (Patient and spouse verbalized understanding)    Home Living Family/patient expects to be discharged to:: Private residence Living Arrangements: Spouse/significant other;Children (spouse and 82 yr old son) Available Help at Discharge: Family Type of Home: House Home Access: Stairs to enter Secretary/administrator of Steps: 3 Entrance Stairs-Rails: Left Home Layout: One level     Bathroom Shower/Tub: Walk-in shower         Home Equipment: Shower seat - built in          Prior Functioning/Environment Prior Level of Function : Independent/Modified Independent;Driving             Mobility Comments: Independent with ambulation. ADLs Comments: Independent with ADLs and driving.    OT Problem List: Decreased strength;Decreased range of motion;Impaired UE functional use   OT Treatment/Interventions:   N/A     OT Goals(Current goals can be found in the care plan section)   Acute Rehab OT Goals OT Goal Formulation: All assessment and education complete, DC therapy   OT Frequency:   N/A       AM-PAC OT 6 Clicks Daily Activity     Outcome Measure Help from another person eating meals?: None Help from another person taking care of personal grooming?: None Help from another person toileting, which includes using toliet, bedpan, or urinal?: A Little Help from another person bathing (including washing, rinsing, drying)?: A Little Help from another person to put on and taking off regular upper body clothing?:  A Lot Help from another person to put on and taking off regular lower body clothing?: A Little 6 Click Score: 19   End of Session Equipment Utilized During Treatment: Other (comment) (N/A) Nurse Communication: Other (comment) (shoulder education completed)  Activity Tolerance: Patient tolerated treatment well Patient left: in chair;with call bell/phone within reach;with family/visitor present  OT Visit  Diagnosis: Muscle weakness (generalized) (M62.81)                Time: 8865-8847 OT Time Calculation (min): 18 min Charges:  OT General Charges $OT Visit: 1 Visit OT Evaluation $OT Eval Moderate Complexity: 1 Mod    Aysiah Jurado J Harris, OTR/L 01/08/2024, 2:06 PM

## 2024-01-08 NOTE — Discharge Instructions (Signed)
 Ice to the shoulder constantly.  Keep the incision covered and clean and dry for one week, then ok to get it wet in the shower. Once Gel bandage is on you can shower as bandage is waterproof  Do exercise as instructed several times per day.  DO NOT reach behind your back or push up out of a chair with the operative arm.  Use a sling while you are up and around for comfort, may remove while seated.  Keep pillow propped behind the operative elbow.  Follow up with Dr Kay in two weeks in the office, call [phone] for appt  Please call Dr Kay (cell) [phone] with any questions or concerns

## 2024-01-08 NOTE — Interval H&P Note (Signed)
 History and Physical Interval Note:  01/08/2024 7:09 AM  Ivan Jones  has presented today for surgery, with the diagnosis of Osteoarthritis of shoulder region.  The various methods of treatment have been discussed with the patient and family. After consideration of risks, benefits and other options for treatment, the patient has consented to  Procedure(s): ARTHROPLASTY, SHOULDER, TOTAL (Right) as a surgical intervention.  The patient's history has been reviewed, patient examined, no change in status, stable for surgery.  I have reviewed the patient's chart and labs.  Questions were answered to the patient's satisfaction.     [name]

## 2024-01-08 NOTE — Anesthesia Procedure Notes (Addendum)
 Procedure Name: Intubation Date/Time: 01/08/2024 7:31 AM  Performed by: Metta Andrea NOVAK, CRNAPre-anesthesia Checklist: Patient identified, Emergency Drugs available, Suction available and Patient being monitored Patient Re-evaluated:Patient Re-evaluated prior to induction Oxygen Delivery Method: Circle system utilized Preoxygenation: Pre-oxygenation with 100% oxygen Induction Type: IV induction Ventilation: Oral airway inserted - appropriate to patient size and Two handed mask ventilation required Laryngoscope Size: Glidescope and 4 Grade View: Grade I Tube type: Oral Tube size: 7.5 mm Number of attempts: 1 Airway Equipment and Method: Stylet, Oral airway and Video-laryngoscopy Placement Confirmation: ETT inserted through vocal cords under direct vision, positive ETCO2 and breath sounds checked- equal and bilateral Secured at: 22 cm Tube secured with: Tape Dental Injury: Teeth and Oropharynx as per pre-operative assessment  Comments: ETT placed by Edsel London, SRNA under direct supervision by Andrea, CRNA and Epifanio, MD

## 2024-01-08 NOTE — Op Note (Signed)
 Ivan Jones, [place] MEDICAL RECORD NO: [id number] ACCOUNT NO: [id number] DATE OF BIRTH: 1975-05-09 FACILITY: [facility] LOCATION: [facility] PHYSICIAN: Elspeth SAUNDERS. Kay, MD  Operative Report   DATE OF PROCEDURE: 01/08/2024  PREOPERATIVE DIAGNOSIS:  Right shoulder end-stage osteoarthritis.  POSTOPERATIVE DIAGNOSIS:  Right shoulder end-stage osteoarthritis.  PROCEDURE PERFORMED:  Right anatomic total shoulder arthroplasty using DePuy Global Unite system with APG glenoid.  ATTENDING SURGEON:  Elspeth SAUNDERS. Kay, MD  ASSISTANT:  Debby Crock Dixon, [place], who was scrubbed during the entire procedure, and necessary for satisfactory completion of surgery.  ANESTHESIA:  General anesthesia was used plus interscalene block.  ESTIMATED BLOOD LOSS:  200 mL.  FLUID REPLACEMENT:  1500 mL crystalloid.  COUNTS:  Instrument counts correct.  COMPLICATIONS:  No complications.  ANTIBIOTICS:  Perioperative antibiotics were given.  INDICATIONS:  The patient is a 48 year old male who presents with worsening shoulder pain and loss of range of motion and function due to end-stage osteoarthritis, bone-on-bone.  The patient has failed all measures of conservative management and desires  operative treatment to eliminate pain and to restore function.  Informed consent obtained.  DESCRIPTION OF PROCEDURE:  After an adequate level of anesthesia was achieved the patient was positioned in the modified beach chair position.  Right shoulder correctly identified and sterile prep and drape performed.  Timeout called verifying correct  patient and correct site.  We entered the patient's shoulder using a standard deltopectoral incision starting at the coracoid process and extending down the anterior humerus with a 10-blade scalpel.  Dissection down through subcutaneous tissues using  Bovie.  Cephalic vein was identified and taken laterally with the deltoid.  Pectoralis was retracted medially.  Conjoined tendon identified and  retracted medially.  Deep retractors were placed.  We identified the biceps tendon and tenodesed that in situ  with 0 Vicryl figure-of-eight suture x2 incorporating part of the pec tendon.  We then released the subscapularis subperiosteally with a Bovie in a peel type fashion.  We placed number 2 FiberWire suture x2 with a modified Mason-Allen suture technique  for repair of the tendon.  We then released the inferior capsule.  We had good exposure and placed a T-handle Crego elevator over the humeral head to protect the rotator cuff and then a smooth Hohmann medially.  We then placed the patient's elbow at the  side and then externally rotated the forearm about 30 degrees.  We then cut the humeral head even and flush with the rotator cuff insertion anterior to posterior with the oscillating saw.  Once we had the humeral head cut done we removed excess  osteophytes off the medial neck with a rongeur.  We then subluxed the humerus posteriorly.  I was able to size the head to 48 so we knew we could do a 44 glenoid size.  We removed the biceps stump, the labrum and placed our deep retractors.  The  patient's glenoid was completely devoid of cartilage.  We placed our guide pin centered for the 44 APG glenoid.  We then reamed to subchondral bone.  We did a bow-tie reamer drilled out our central peg hole.  We then impacted the guide in place for the 3  peripheral pegs.  We drilled those sequentially and then I placed Gelfoam and thrombin  in the 3 peripheral holes after trialing first with the 44 APG trial.  Once we were sure that the trial was well supported and we removed the trial and then we did  the Gelfoam and thrombin  in  the 3 peripheral holes while we vacuum mixed high viscosity cement on the back table.  I did use some DBM bone matrix mixed with a little bit of the patient's bone from his humeral head and placed that in the fluids of the  central peg.  Once the cement was ready to go we placed that in the 3  peripheral holes under pressure.  We then impacted the glenoid into position.  I held pressure until the cement was set up on the back table.  We then went ahead and went to the  humeral side.  We reamed from a 6 mm up to a 10 mm.  We then broached with the 8 and then the 10 at 30 degrees of retroversion.  We then placed the 10 mm Global Unite trial and then started with the 48 18 and selected that as our real head size.  We  reduced the shoulder we were happy with our soft tissue balancing and stability.  I was able to displace the humerus inferiorly and posteriorly and the humerus spontaneously relocated.  Once we were satisfied with our trials we removed all the humeral  components.  We irrigated thoroughly.  I drilled holes in the lesser tuberosity and placed #2 FiberWire suture for the subscap repair.  We then used available bone graft in impaction grafting technique and we used the Porocoat 10 stem with the size 10  epi and we impacted that in 30 degrees of retroversion.  With the stem secure we selected the real 48 18 eccentric humeral head and we placed that dialed posterior superiorly for best bony coverage.  We impacted that onto the trunnion.  We then reduced  the shoulder.  We then repaired the subscap anatomically back to bone.  We had excellent soft tissue balancing and freedom of movement and stability.  We then irrigated again and then closed subcutaneous closure with 2-0 Vicryl and 4-0 Monocryl for skin.   Steri-Strips applied followed by sterile dressing.  Patient tolerated surgery well.   PUS D: 01/08/2024 10:06:42 am T: 01/08/2024 10:20:00 am  JOB: [id number]/ [id number]

## 2024-01-11 ENCOUNTER — Encounter (HOSPITAL_COMMUNITY): Payer: Self-pay | Admitting: Orthopedic Surgery

## 2024-01-14 DIAGNOSIS — M25511 Pain in right shoulder: Secondary | ICD-10-CM | POA: Diagnosis not present

## 2024-01-14 DIAGNOSIS — M25611 Stiffness of right shoulder, not elsewhere classified: Secondary | ICD-10-CM | POA: Diagnosis not present

## 2024-01-18 DIAGNOSIS — M25511 Pain in right shoulder: Secondary | ICD-10-CM | POA: Diagnosis not present

## 2024-01-18 DIAGNOSIS — M25611 Stiffness of right shoulder, not elsewhere classified: Secondary | ICD-10-CM | POA: Diagnosis not present

## 2024-01-19 DIAGNOSIS — Z4889 Encounter for other specified surgical aftercare: Secondary | ICD-10-CM | POA: Diagnosis not present

## 2024-01-26 DIAGNOSIS — M25511 Pain in right shoulder: Secondary | ICD-10-CM | POA: Diagnosis not present

## 2024-01-26 DIAGNOSIS — M25611 Stiffness of right shoulder, not elsewhere classified: Secondary | ICD-10-CM | POA: Diagnosis not present

## 2024-02-02 DIAGNOSIS — M25511 Pain in right shoulder: Secondary | ICD-10-CM | POA: Diagnosis not present

## 2024-02-02 DIAGNOSIS — M25611 Stiffness of right shoulder, not elsewhere classified: Secondary | ICD-10-CM | POA: Diagnosis not present

## 2024-05-31 ENCOUNTER — Ambulatory Visit: Admitting: Neurology
